# Patient Record
Sex: Male | Born: 1945 | Race: White | Hispanic: No | Smoking: Former smoker
Health system: Southern US, Community
[De-identification: ages and names within clinical notes are randomized; demographics above are authoritative.]

## PROBLEM LIST (undated history)

## (undated) DIAGNOSIS — I2510 Atherosclerotic heart disease of native coronary artery without angina pectoris: Secondary | ICD-10-CM

## (undated) DIAGNOSIS — M545 Low back pain, unspecified: Secondary | ICD-10-CM

## (undated) DIAGNOSIS — K219 Gastro-esophageal reflux disease without esophagitis: Secondary | ICD-10-CM

## (undated) DIAGNOSIS — I442 Atrioventricular block, complete: Secondary | ICD-10-CM

## (undated) DIAGNOSIS — G458 Other transient cerebral ischemic attacks and related syndromes: Secondary | ICD-10-CM

## (undated) DIAGNOSIS — M25561 Pain in right knee: Secondary | ICD-10-CM

## (undated) DIAGNOSIS — F32A Depression, unspecified: Secondary | ICD-10-CM

## (undated) DIAGNOSIS — I10 Essential (primary) hypertension: Secondary | ICD-10-CM

## (undated) DIAGNOSIS — M47812 Spondylosis without myelopathy or radiculopathy, cervical region: Secondary | ICD-10-CM

## (undated) DIAGNOSIS — F329 Major depressive disorder, single episode, unspecified: Secondary | ICD-10-CM

## (undated) DIAGNOSIS — Z720 Tobacco use: Secondary | ICD-10-CM

## (undated) DIAGNOSIS — F1020 Alcohol dependence, uncomplicated: Secondary | ICD-10-CM

## (undated) DIAGNOSIS — I5032 Chronic diastolic (congestive) heart failure: Secondary | ICD-10-CM

## (undated) DIAGNOSIS — I739 Peripheral vascular disease, unspecified: Secondary | ICD-10-CM

## (undated) DIAGNOSIS — J449 Chronic obstructive pulmonary disease, unspecified: Secondary | ICD-10-CM

## (undated) DIAGNOSIS — I4821 Permanent atrial fibrillation: Secondary | ICD-10-CM

## (undated) DIAGNOSIS — E785 Hyperlipidemia, unspecified: Secondary | ICD-10-CM

## (undated) HISTORY — DX: Chronic obstructive pulmonary disease, unspecified: J44.9

## (undated) HISTORY — DX: Tobacco use: Z72.0

## (undated) HISTORY — DX: Permanent atrial fibrillation: I48.21

## (undated) HISTORY — DX: Atherosclerotic heart disease of native coronary artery without angina pectoris: I25.10

## (undated) HISTORY — DX: Hyperlipidemia, unspecified: E78.5

## (undated) HISTORY — DX: Gastro-esophageal reflux disease without esophagitis: K21.9

## (undated) HISTORY — DX: Depression, unspecified: F32.A

## (undated) HISTORY — DX: Low back pain, unspecified: M54.50

## (undated) HISTORY — DX: Low back pain: M54.5

## (undated) HISTORY — DX: Atrioventricular block, complete: I44.2

## (undated) HISTORY — DX: Pain in right knee: M25.561

## (undated) HISTORY — DX: Other transient cerebral ischemic attacks and related syndromes: G45.8

## (undated) HISTORY — DX: Alcohol dependence, uncomplicated: F10.20

## (undated) HISTORY — DX: Chronic diastolic (congestive) heart failure: I50.32

## (undated) HISTORY — DX: Spondylosis without myelopathy or radiculopathy, cervical region: M47.812

## (undated) HISTORY — DX: Peripheral vascular disease, unspecified: I73.9

## (undated) HISTORY — DX: Essential (primary) hypertension: I10

## (undated) HISTORY — PX: OTHER SURGICAL HISTORY: SHX169

## (undated) HISTORY — DX: Major depressive disorder, single episode, unspecified: F32.9

---

## 1998-04-02 ENCOUNTER — Encounter: Admission: RE | Admit: 1998-04-02 | Discharge: 1998-04-02 | Payer: Self-pay | Admitting: Internal Medicine

## 1998-06-20 ENCOUNTER — Encounter: Admission: RE | Admit: 1998-06-20 | Discharge: 1998-06-20 | Payer: Self-pay | Admitting: Internal Medicine

## 1998-09-07 ENCOUNTER — Encounter: Admission: RE | Admit: 1998-09-07 | Discharge: 1998-09-07 | Payer: Self-pay | Admitting: Internal Medicine

## 1998-09-10 ENCOUNTER — Encounter: Admission: RE | Admit: 1998-09-10 | Discharge: 1998-12-09 | Payer: Self-pay | Admitting: Internal Medicine

## 1999-01-22 ENCOUNTER — Encounter: Admission: RE | Admit: 1999-01-22 | Discharge: 1999-01-22 | Payer: Self-pay | Admitting: Internal Medicine

## 1999-01-28 ENCOUNTER — Encounter: Admission: RE | Admit: 1999-01-28 | Discharge: 1999-01-28 | Payer: Self-pay | Admitting: Family Medicine

## 1999-02-05 ENCOUNTER — Ambulatory Visit (HOSPITAL_COMMUNITY): Admission: RE | Admit: 1999-02-05 | Discharge: 1999-02-05 | Payer: Self-pay | Admitting: Hematology and Oncology

## 1999-02-05 ENCOUNTER — Encounter: Admission: RE | Admit: 1999-02-05 | Discharge: 1999-02-05 | Payer: Self-pay | Admitting: Hematology and Oncology

## 1999-02-13 ENCOUNTER — Encounter: Admission: RE | Admit: 1999-02-13 | Discharge: 1999-02-13 | Payer: Self-pay | Admitting: Family Medicine

## 1999-02-27 ENCOUNTER — Encounter: Admission: RE | Admit: 1999-02-27 | Discharge: 1999-02-27 | Payer: Self-pay | Admitting: Family Medicine

## 1999-03-06 ENCOUNTER — Encounter: Admission: RE | Admit: 1999-03-06 | Discharge: 1999-03-06 | Payer: Self-pay | Admitting: Internal Medicine

## 1999-03-22 ENCOUNTER — Inpatient Hospital Stay (HOSPITAL_COMMUNITY): Admission: EM | Admit: 1999-03-22 | Discharge: 1999-03-24 | Payer: Self-pay | Admitting: Emergency Medicine

## 1999-03-22 ENCOUNTER — Encounter: Payer: Self-pay | Admitting: Cardiovascular Disease

## 1999-03-27 ENCOUNTER — Ambulatory Visit: Admission: RE | Admit: 1999-03-27 | Discharge: 1999-03-27 | Payer: Self-pay | Admitting: Cardiovascular Disease

## 1999-04-25 ENCOUNTER — Inpatient Hospital Stay: Admission: RE | Admit: 1999-04-25 | Discharge: 1999-04-27 | Payer: Self-pay | Admitting: Vascular Surgery

## 1999-04-27 HISTORY — PX: OTHER SURGICAL HISTORY: SHX169

## 1999-06-19 ENCOUNTER — Encounter: Admission: RE | Admit: 1999-06-19 | Discharge: 1999-06-19 | Payer: Self-pay | Admitting: Internal Medicine

## 1999-08-29 ENCOUNTER — Encounter: Admission: RE | Admit: 1999-08-29 | Discharge: 1999-08-29 | Payer: Self-pay | Admitting: Internal Medicine

## 1999-09-17 ENCOUNTER — Encounter: Admission: RE | Admit: 1999-09-17 | Discharge: 1999-09-17 | Payer: Self-pay | Admitting: Hematology and Oncology

## 1999-10-30 ENCOUNTER — Encounter: Admission: RE | Admit: 1999-10-30 | Discharge: 1999-10-30 | Payer: Self-pay | Admitting: Hematology and Oncology

## 1999-12-18 ENCOUNTER — Encounter: Admission: RE | Admit: 1999-12-18 | Discharge: 1999-12-18 | Payer: Self-pay | Admitting: Internal Medicine

## 2000-01-29 ENCOUNTER — Encounter: Admission: RE | Admit: 2000-01-29 | Discharge: 2000-01-29 | Payer: Self-pay | Admitting: Internal Medicine

## 2000-06-03 ENCOUNTER — Encounter: Admission: RE | Admit: 2000-06-03 | Discharge: 2000-06-03 | Payer: Self-pay | Admitting: Internal Medicine

## 2000-06-24 ENCOUNTER — Encounter: Payer: Self-pay | Admitting: Internal Medicine

## 2000-08-11 ENCOUNTER — Encounter: Admission: RE | Admit: 2000-08-11 | Discharge: 2000-08-11 | Payer: Self-pay | Admitting: Internal Medicine

## 2000-10-14 ENCOUNTER — Encounter: Admission: RE | Admit: 2000-10-14 | Discharge: 2000-10-14 | Payer: Self-pay | Admitting: Internal Medicine

## 2000-10-27 HISTORY — PX: OTHER SURGICAL HISTORY: SHX169

## 2001-02-03 ENCOUNTER — Encounter: Admission: RE | Admit: 2001-02-03 | Discharge: 2001-02-03 | Payer: Self-pay | Admitting: Internal Medicine

## 2001-02-25 ENCOUNTER — Encounter: Admission: RE | Admit: 2001-02-25 | Discharge: 2001-02-25 | Payer: Self-pay | Admitting: Internal Medicine

## 2001-02-25 ENCOUNTER — Ambulatory Visit (HOSPITAL_COMMUNITY): Admission: RE | Admit: 2001-02-25 | Discharge: 2001-02-25 | Payer: Self-pay | Admitting: Internal Medicine

## 2001-03-11 ENCOUNTER — Encounter: Admission: RE | Admit: 2001-03-11 | Discharge: 2001-03-11 | Payer: Self-pay | Admitting: Hematology and Oncology

## 2001-04-06 ENCOUNTER — Encounter: Admission: RE | Admit: 2001-04-06 | Discharge: 2001-04-06 | Payer: Self-pay | Admitting: Internal Medicine

## 2001-05-11 ENCOUNTER — Ambulatory Visit (HOSPITAL_COMMUNITY): Admission: RE | Admit: 2001-05-11 | Discharge: 2001-05-11 | Payer: Self-pay | Admitting: Neurosurgery

## 2001-05-11 ENCOUNTER — Encounter: Payer: Self-pay | Admitting: Neurosurgery

## 2001-06-09 ENCOUNTER — Encounter: Payer: Self-pay | Admitting: Neurosurgery

## 2001-06-09 ENCOUNTER — Ambulatory Visit (HOSPITAL_COMMUNITY): Admission: RE | Admit: 2001-06-09 | Discharge: 2001-06-10 | Payer: Self-pay | Admitting: Neurosurgery

## 2001-07-16 ENCOUNTER — Encounter: Admission: RE | Admit: 2001-07-16 | Discharge: 2001-07-16 | Payer: Self-pay | Admitting: Internal Medicine

## 2001-07-29 ENCOUNTER — Encounter: Payer: Self-pay | Admitting: Neurosurgery

## 2001-07-29 ENCOUNTER — Ambulatory Visit (HOSPITAL_COMMUNITY): Admission: RE | Admit: 2001-07-29 | Discharge: 2001-07-29 | Payer: Self-pay | Admitting: Neurosurgery

## 2001-09-27 ENCOUNTER — Ambulatory Visit (HOSPITAL_COMMUNITY): Admission: RE | Admit: 2001-09-27 | Discharge: 2001-09-27 | Payer: Self-pay | Admitting: Neurosurgery

## 2001-09-27 ENCOUNTER — Encounter: Payer: Self-pay | Admitting: Neurosurgery

## 2001-10-01 ENCOUNTER — Ambulatory Visit (HOSPITAL_COMMUNITY): Admission: RE | Admit: 2001-10-01 | Discharge: 2001-10-01 | Payer: Self-pay | Admitting: Neurosurgery

## 2001-10-01 ENCOUNTER — Encounter: Payer: Self-pay | Admitting: Neurosurgery

## 2001-12-22 ENCOUNTER — Encounter: Admission: RE | Admit: 2001-12-22 | Discharge: 2001-12-22 | Payer: Self-pay | Admitting: Internal Medicine

## 2002-03-30 ENCOUNTER — Encounter: Admission: RE | Admit: 2002-03-30 | Discharge: 2002-03-30 | Payer: Self-pay | Admitting: Internal Medicine

## 2002-05-10 ENCOUNTER — Encounter: Admission: RE | Admit: 2002-05-10 | Discharge: 2002-05-10 | Payer: Self-pay | Admitting: Internal Medicine

## 2002-05-31 ENCOUNTER — Encounter: Admission: RE | Admit: 2002-05-31 | Discharge: 2002-05-31 | Payer: Self-pay | Admitting: Internal Medicine

## 2002-06-21 ENCOUNTER — Encounter: Admission: RE | Admit: 2002-06-21 | Discharge: 2002-06-21 | Payer: Self-pay | Admitting: Internal Medicine

## 2002-08-16 ENCOUNTER — Encounter: Admission: RE | Admit: 2002-08-16 | Discharge: 2002-08-16 | Payer: Self-pay | Admitting: Internal Medicine

## 2002-11-16 ENCOUNTER — Encounter: Admission: RE | Admit: 2002-11-16 | Discharge: 2002-11-16 | Payer: Self-pay | Admitting: Internal Medicine

## 2003-03-08 ENCOUNTER — Encounter: Admission: RE | Admit: 2003-03-08 | Discharge: 2003-03-08 | Payer: Self-pay | Admitting: Internal Medicine

## 2003-03-14 ENCOUNTER — Encounter: Admission: RE | Admit: 2003-03-14 | Discharge: 2003-03-14 | Payer: Self-pay | Admitting: Internal Medicine

## 2003-10-31 ENCOUNTER — Encounter: Admission: RE | Admit: 2003-10-31 | Discharge: 2003-10-31 | Payer: Self-pay | Admitting: Internal Medicine

## 2004-01-09 ENCOUNTER — Encounter: Admission: RE | Admit: 2004-01-09 | Discharge: 2004-01-09 | Payer: Self-pay | Admitting: Internal Medicine

## 2004-02-26 LAB — HM COLONOSCOPY: HM Colonoscopy: 3

## 2004-04-24 ENCOUNTER — Encounter: Admission: RE | Admit: 2004-04-24 | Discharge: 2004-04-24 | Payer: Self-pay | Admitting: Internal Medicine

## 2004-07-18 ENCOUNTER — Ambulatory Visit: Payer: Self-pay | Admitting: Internal Medicine

## 2004-09-26 ENCOUNTER — Ambulatory Visit: Payer: Self-pay | Admitting: Internal Medicine

## 2004-12-30 ENCOUNTER — Ambulatory Visit: Payer: Self-pay | Admitting: Internal Medicine

## 2005-04-01 ENCOUNTER — Ambulatory Visit: Payer: Self-pay | Admitting: Internal Medicine

## 2005-06-09 ENCOUNTER — Ambulatory Visit: Payer: Self-pay | Admitting: Internal Medicine

## 2006-01-05 ENCOUNTER — Ambulatory Visit: Payer: Self-pay | Admitting: Internal Medicine

## 2006-07-13 ENCOUNTER — Ambulatory Visit: Payer: Self-pay | Admitting: Internal Medicine

## 2006-07-28 ENCOUNTER — Ambulatory Visit: Payer: Self-pay | Admitting: Internal Medicine

## 2006-09-09 DIAGNOSIS — K219 Gastro-esophageal reflux disease without esophagitis: Secondary | ICD-10-CM

## 2006-09-09 DIAGNOSIS — F1011 Alcohol abuse, in remission: Secondary | ICD-10-CM | POA: Insufficient documentation

## 2006-09-09 DIAGNOSIS — G458 Other transient cerebral ischemic attacks and related syndromes: Secondary | ICD-10-CM

## 2006-09-09 DIAGNOSIS — F329 Major depressive disorder, single episode, unspecified: Secondary | ICD-10-CM

## 2006-09-09 DIAGNOSIS — I10 Essential (primary) hypertension: Secondary | ICD-10-CM

## 2006-09-09 DIAGNOSIS — E785 Hyperlipidemia, unspecified: Secondary | ICD-10-CM | POA: Insufficient documentation

## 2006-09-09 DIAGNOSIS — Z9889 Other specified postprocedural states: Secondary | ICD-10-CM

## 2006-09-09 DIAGNOSIS — J449 Chronic obstructive pulmonary disease, unspecified: Secondary | ICD-10-CM

## 2006-10-15 DIAGNOSIS — M545 Low back pain: Secondary | ICD-10-CM

## 2006-11-16 ENCOUNTER — Ambulatory Visit: Payer: Self-pay | Admitting: Internal Medicine

## 2006-11-16 LAB — CONVERTED CEMR LAB
BUN: 18 mg/dL (ref 6–23)
CO2: 25 meq/L (ref 19–32)
Calcium: 9.6 mg/dL (ref 8.4–10.5)
Chloride: 104 meq/L (ref 96–112)
Cholesterol: 147 mg/dL (ref 0–200)
Creatinine, Ser: 0.89 mg/dL (ref 0.40–1.50)
Glucose, Bld: 95 mg/dL (ref 70–99)
HDL: 33 mg/dL — ABNORMAL LOW (ref >39)
LDL Cholesterol: 85 mg/dL (ref 0–99)
Magnesium: 2.1 mg/dL (ref 1.5–2.5)
Potassium: 4.9 meq/L (ref 3.5–5.3)
Sodium: 140 meq/L (ref 135–145)
Total CHOL/HDL Ratio: 4.5
Triglycerides: 143 mg/dL (ref <150)
VLDL: 29 mg/dL (ref 0–40)

## 2007-02-11 ENCOUNTER — Telehealth: Payer: Self-pay | Admitting: *Deleted

## 2007-02-26 ENCOUNTER — Telehealth (INDEPENDENT_AMBULATORY_CARE_PROVIDER_SITE_OTHER): Payer: Self-pay | Admitting: *Deleted

## 2007-04-12 ENCOUNTER — Telehealth (INDEPENDENT_AMBULATORY_CARE_PROVIDER_SITE_OTHER): Payer: Self-pay | Admitting: *Deleted

## 2007-05-03 ENCOUNTER — Ambulatory Visit: Payer: Self-pay | Admitting: Internal Medicine

## 2007-06-08 ENCOUNTER — Telehealth: Payer: Self-pay | Admitting: Infectious Disease

## 2007-07-08 ENCOUNTER — Telehealth: Payer: Self-pay | Admitting: *Deleted

## 2007-08-09 ENCOUNTER — Telehealth (INDEPENDENT_AMBULATORY_CARE_PROVIDER_SITE_OTHER): Payer: Self-pay | Admitting: *Deleted

## 2007-09-01 ENCOUNTER — Telehealth (INDEPENDENT_AMBULATORY_CARE_PROVIDER_SITE_OTHER): Payer: Self-pay | Admitting: *Deleted

## 2007-10-06 ENCOUNTER — Telehealth: Payer: Self-pay | Admitting: *Deleted

## 2007-10-07 ENCOUNTER — Encounter: Payer: Self-pay | Admitting: Internal Medicine

## 2007-11-05 ENCOUNTER — Ambulatory Visit: Payer: Self-pay | Admitting: Internal Medicine

## 2007-11-05 ENCOUNTER — Telehealth: Payer: Self-pay | Admitting: *Deleted

## 2007-11-05 LAB — CONVERTED CEMR LAB
BUN: 17 mg/dL (ref 6–23)
CO2: 25 meq/L (ref 19–32)
Calcium: 9.3 mg/dL (ref 8.4–10.5)
Chloride: 105 meq/L (ref 96–112)
Creatinine, Ser: 0.82 mg/dL (ref 0.40–1.50)
Glucose, Bld: 127 mg/dL — ABNORMAL HIGH (ref 70–99)
Potassium: 5 meq/L (ref 3.5–5.3)
Sodium: 142 meq/L (ref 135–145)

## 2008-01-03 ENCOUNTER — Telehealth: Payer: Self-pay | Admitting: *Deleted

## 2008-03-28 ENCOUNTER — Telehealth: Payer: Self-pay | Admitting: Internal Medicine

## 2008-04-03 ENCOUNTER — Telehealth: Payer: Self-pay | Admitting: *Deleted

## 2008-05-17 ENCOUNTER — Ambulatory Visit: Payer: Self-pay | Admitting: Internal Medicine

## 2008-05-17 ENCOUNTER — Ambulatory Visit (HOSPITAL_COMMUNITY): Admission: RE | Admit: 2008-05-17 | Discharge: 2008-05-17 | Payer: Self-pay | Admitting: Internal Medicine

## 2008-05-19 ENCOUNTER — Encounter: Payer: Self-pay | Admitting: Internal Medicine

## 2008-05-24 ENCOUNTER — Encounter: Payer: Self-pay | Admitting: Internal Medicine

## 2008-07-19 ENCOUNTER — Encounter: Payer: Self-pay | Admitting: Internal Medicine

## 2008-08-09 ENCOUNTER — Encounter: Payer: Self-pay | Admitting: Internal Medicine

## 2008-08-28 ENCOUNTER — Telehealth: Payer: Self-pay | Admitting: Internal Medicine

## 2008-10-12 ENCOUNTER — Encounter: Payer: Self-pay | Admitting: Internal Medicine

## 2008-11-20 ENCOUNTER — Telehealth: Payer: Self-pay | Admitting: Internal Medicine

## 2008-11-24 ENCOUNTER — Ambulatory Visit: Payer: Self-pay | Admitting: Internal Medicine

## 2008-11-24 LAB — CONVERTED CEMR LAB
CO2: 25 meq/L (ref 19–32)
Creatinine, Ser: 0.88 mg/dL (ref 0.40–1.50)
Glucose, Bld: 108 mg/dL — ABNORMAL HIGH (ref 70–99)
Total Bilirubin: 0.3 mg/dL (ref 0.3–1.2)

## 2008-12-21 ENCOUNTER — Telehealth: Payer: Self-pay | Admitting: Internal Medicine

## 2009-01-23 ENCOUNTER — Telehealth: Payer: Self-pay | Admitting: Internal Medicine

## 2009-01-31 ENCOUNTER — Telehealth: Payer: Self-pay | Admitting: Internal Medicine

## 2009-03-23 ENCOUNTER — Ambulatory Visit: Payer: Self-pay | Admitting: Internal Medicine

## 2009-03-23 LAB — CONVERTED CEMR LAB
CO2: 26 meq/L (ref 19–32)
GFR calc Af Amer: >60 mL/min (ref >60)
Glucose, Bld: 109 mg/dL — ABNORMAL HIGH (ref 70–99)
Hemoglobin: 16 g/dL (ref 13.0–17.0)
MCHC: 35 g/dL (ref 30.0–36.0)
MCV: 96.6 fL (ref 78.0–100.0)
Potassium: 4.3 meq/L (ref 3.5–5.3)
RBC: 4.73 M/uL (ref 4.22–5.81)
Sodium: 140 meq/L (ref 135–145)
WBC: 7.9 10*3/uL (ref 4.0–10.5)

## 2009-05-27 DIAGNOSIS — M25561 Pain in right knee: Secondary | ICD-10-CM

## 2009-05-27 HISTORY — DX: Pain in right knee: M25.561

## 2009-06-14 ENCOUNTER — Ambulatory Visit: Payer: Self-pay | Admitting: Internal Medicine

## 2009-06-14 DIAGNOSIS — M25569 Pain in unspecified knee: Secondary | ICD-10-CM

## 2009-09-17 ENCOUNTER — Telehealth: Payer: Self-pay | Admitting: Internal Medicine

## 2009-11-05 ENCOUNTER — Ambulatory Visit: Payer: Self-pay | Admitting: Internal Medicine

## 2009-12-26 ENCOUNTER — Telehealth: Payer: Self-pay | Admitting: *Deleted

## 2010-01-04 ENCOUNTER — Ambulatory Visit: Payer: Self-pay | Admitting: Internal Medicine

## 2010-01-04 LAB — CONVERTED CEMR LAB
CO2: 25 meq/L (ref 19–32)
Chloride: 102 meq/L (ref 96–112)
Creatinine, Ser: 0.86 mg/dL (ref 0.40–1.50)
HDL: 34 mg/dL — ABNORMAL LOW (ref >39)
LDL Cholesterol: 83 mg/dL (ref 0–99)
Potassium: 5 meq/L (ref 3.5–5.3)
VLDL: 24 mg/dL (ref 0–40)

## 2010-02-04 ENCOUNTER — Telehealth: Payer: Self-pay | Admitting: Internal Medicine

## 2010-06-26 ENCOUNTER — Telehealth: Payer: Self-pay | Admitting: Internal Medicine

## 2010-07-19 ENCOUNTER — Ambulatory Visit: Payer: Self-pay | Admitting: Internal Medicine

## 2010-07-22 LAB — CONVERTED CEMR LAB
ALT: 11 units/L (ref 0–53)
AST: 19 units/L (ref 0–37)
Alkaline Phosphatase: 73 units/L (ref 39–117)
Amphetamine Screen, Ur: NEGATIVE
Barbiturate Quant, Ur: NEGATIVE
Benzodiazepines.: NEGATIVE
CO2: 27 meq/L (ref 19–32)
Creatinine, Ser: 0.87 mg/dL (ref 0.40–1.50)
Marijuana Metabolite: NEGATIVE
Methadone: NEGATIVE
Opiates: POSITIVE — AB
Propoxyphene: NEGATIVE
Sodium: 139 meq/L (ref 135–145)
Total Bilirubin: 0.4 mg/dL (ref 0.3–1.2)
Total Protein: 7.3 g/dL (ref 6.0–8.3)

## 2010-11-24 LAB — CONVERTED CEMR LAB
ALT: 10 units/L (ref 0–53)
AST: 16 units/L (ref 0–37)
Albumin: 4.4 g/dL (ref 3.5–5.2)
Alkaline Phosphatase: 64 units/L (ref 39–117)
BUN: 12 mg/dL (ref 6–23)
CO2: 26 meq/L (ref 19–32)
Calcium: 9.2 mg/dL (ref 8.4–10.5)
Chloride: 105 meq/L (ref 96–112)
Cholesterol: 117 mg/dL (ref 0–200)
Creatinine, Ser: 0.8 mg/dL (ref 0.40–1.50)
Glucose, Bld: 102 mg/dL — ABNORMAL HIGH (ref 70–99)
HDL: 29 mg/dL — ABNORMAL LOW (ref >39)
LDL Cholesterol: 56 mg/dL (ref 0–99)
Potassium: 4.8 meq/L (ref 3.5–5.3)
Sodium: 142 meq/L (ref 135–145)
Total Bilirubin: 0.4 mg/dL (ref 0.3–1.2)
Total CHOL/HDL Ratio: 4
Total Protein: 6.6 g/dL (ref 6.0–8.3)
Triglycerides: 160 mg/dL — ABNORMAL HIGH (ref <150)
VLDL: 32 mg/dL (ref 0–40)

## 2010-11-28 NOTE — Progress Notes (Signed)
Summary: Refill/gh  Phone Note Refill Request Message from:  Fax from Pharmacy on December 26, 2009 4:42 PM  Refills Requested: Medication #1:  HYDROCODONE-ACETAMINOPHEN 5-325 MG TABS Take 2  tablets by mouth three times a day as needed for pain   Last Refilled: 11/27/2009  Method Requested: Electronic Initial call taken by: Angelina Ok RN,  December 26, 2009 4:42 PM  Follow-up for Phone Call        Refill approved-nurse to complete Follow-up by: Ulyess Mort MD,  December 27, 2009 11:17 AM  Additional Follow-up for Phone Call Additional follow up Details #1::        called to pharm, 5 total fills, cannot call more than 5 at a time Additional Follow-up by: Marin Roberts RN,  December 27, 2009 12:02 PM    Prescriptions: HYDROCODONE-ACETAMINOPHEN 5-325 MG TABS (HYDROCODONE-ACETAMINOPHEN) Take 2  tablets by mouth three times a day as needed for pain  #180 x 6   Entered and Authorized by:   Ulyess Mort MD   Signed by:   Ulyess Mort MD on 12/27/2009   Method used:   Telephoned to ...       [facility] (retail)       [address]       [place], [place]  [id number]       Ph: [phone]       Fax: [phone]   RxID:   [id number]

## 2010-11-28 NOTE — Assessment & Plan Note (Signed)
Summary: FU VISIT/DS   Vital Signs:  Patient profile:   65 year old male Height:      67 inches (170.18 cm) Weight:      143.6 pounds (65.27 kg) BMI:     22.57 Temp:     97.9 degrees F (36.61 degrees C) oral Pulse rate:   58 / minute BP sitting:   132 / 74  (left arm) Cuff size:   regular  Vitals Entered By: Cynda Familia Duncan Dull) (July 19, 2010 9:40 AM) CC: routine f/u and flu vaccine Is Patient Diabetic? No Nutritional Status BMI of 19 -24 = normal  Does patient need assistance? Functional Status Self care Ambulation Normal   CC:  routine f/u and flu vaccine.  History of Present Illness: 89 man with HTN abd depression. Doing well. Busy and not depressed. Brought in all meds and taking all w/o problems.  Preventive Screening-Counseling & Management  Alcohol-Tobacco     Smoking Status: current     Smoking Cessation Counseling: yes     Packs/Day: 4cigs/day  Allergies: 1)  ! Asa  Physical Exam  General:  well-developed.   Eyes:  anicteric Neck:  no carotid bruits.   Lungs:  remarkably clear with normal amplitude breath sounds Heart:  regular.  no murmur Extremities:  no edema   Impression & Recommendations:  Problem # 1:  HYPERTENSION (ICD-401.9)  good control.  Check Bmet.   His updated medication list for this problem includes:    Atenolol 25 Mg Tabs (Atenolol) .[name]... Take 1 tablet by mouth once a day    Lisinopril-hydrochlorothiazide 20-12.5 Mg Tabs (Lisinopril-hydrochlorothiazide) .[name]... Take 1 tablet by mouth once a day  Problem # 2:  HYPERLIPIDEMIA (ICD-272.4)  # panels in past 3 years -- all the same.  Low HDL and low LDL   His updated medication list for this problem includes:    Lipitor 40 Mg Tabs (Atorvastatin calcium) .[name]... Take 1 tablet by mouth once a day  Orders: T-CMP with Estimated GFR ([id number])  Problem # 3:  Preventive Health Care (ICD-V70.0) Has regular eye visits in [place]. Has regular f/u with Dr. [name] (GI) in  [place].  Has had colonoscopy in past 2 years.  Problem # 4:  LOW BACK PAIN SYNDROME (ICD-724.2) He has diffuse arthritic complaints most c/w DJD.  Has been on stable dose of vicodin for years w/o any problems.  Will continue this and obtain UDS today.   His updated medication list for this problem includes:    Hydrocodone-acetaminophen 5-325 Mg Tabs (Hydrocodone-acetaminophen) .[name]... Take 2  tablets by mouth three times a day as needed for pain    Aspirin 81 Mg Tbec (Aspirin) .[name]... Take 1 tablet by mouth once a day  Orders: T-Drug Screen-Urine, (single) [phone])  Complete Medication List: 1)  Atenolol 25 Mg Tabs (Atenolol) .... Take 1 tablet by mouth once a day 2)  Lipitor 40 Mg Tabs (Atorvastatin calcium) .... Take 1 tablet by mouth once a day 3)  Hydrocodone-acetaminophen 5-325 Mg Tabs (Hydrocodone-acetaminophen) .... Take 2  tablets by mouth three times a day as needed for pain 4)  Zoloft 50 Mg Tabs (Sertraline hcl) .... Take 1 tablet by mouth once a day 5)  Aspirin 81 Mg Tbec (Aspirin) .... Take 1 tablet by mouth once a day 6)  Albuterol 90 Mcg/act Aers (Albuterol) .... Inhale 2 puffs as needed 7)  Lisinopril-hydrochlorothiazide 20-12.5 Mg Tabs (Lisinopril-hydrochlorothiazide) .... Take 1 tablet by mouth once a day  Patient Instructions: 1)  Please  schedule a follow-up appointment in 6 months. Prescriptions: ZOLOFT 50 MG TABS (SERTRALINE HCL) Take 1 tablet by mouth once a day  #31 Tablet x 5   Entered and Authorized by:   Ulyess Mort MD   Signed by:   Ulyess Mort MD on 07/19/2010   Method used:   Telephoned to ...       [facility] (retail)       [address]       [place], [place]  [address]       Ph: [phone]       Fax: [phone]   RxID:   [id number] HYDROCODONE-ACETAMINOPHEN 5-325 MG TABS (HYDROCODONE-ACETAMINOPHEN) Take 2  tablets by mouth three times a day as needed for pain  #180 x 3   Entered and  Authorized by:   Ulyess Mort MD   Signed by:   Ulyess Mort MD on 07/19/2010   Method used:   Telephoned to ...       [facility] (retail)       [address]       [place], [place]  [address]       Ph: [phone]       Fax: [phone]   RxID:   [id number] LIPITOR 40 MG TABS (ATORVASTATIN CALCIUM) Take 1 tablet by mouth once a day  #31 x 11   Entered and Authorized by:   Ulyess Mort MD   Signed by:   Ulyess Mort MD on 07/19/2010   Method used:   Telephoned to ...       [facility] (retail)       [address]       [place], [place]  [id number]       Ph: [phone]       Fax: [phone]   RxID:   [id number] ATENOLOL 25 MG TABS (ATENOLOL) Take 1 tablet by mouth once a day  #31 Tablet x 11   Entered and Authorized by:   Ulyess Mort MD   Signed by:   Ulyess Mort MD on 07/19/2010   Method used:   Telephoned to ...       [facility] (retail)       [address]       [place], [place]  [address]       Ph: [phone]       Fax: [phone]   RxID:   [id number]   Prevention & Chronic Care Immunizations   Influenza vaccine: Fluvax 3+  (11/05/2009)    Tetanus booster: Not documented    Pneumococcal vaccine: Pneumovax  (01/04/2010)    H. zoster vaccine: Not documented  Colorectal Screening   Hemoccult: Not documented   Hemoccult action/deferral: Not indicated  (01/04/2010)    Colonoscopy: 3 small polyps.  He reports that he was told on follow-up visit that polyps were benign, and he would need repeat in 5 years.  (02/26/2004)   Colonoscopy due: 02/2009  Other Screening   PSA: Not documented   PSA action/deferral: Not indicated  (01/04/2010)   Smoking status: current  (07/19/2010)   Smoking cessation counseling: yes  (07/19/2010)    Screening comments: Has regular GI visits with Dr. [name] in [place]  Lipids    Total Cholesterol: 141  (01/04/2010)  Lipid panel action/deferral: Lipid Panel ordered   LDL: 83  (01/04/2010)   LDL Direct: Not documented   HDL: 34  (01/04/2010)   Triglycerides: 119  (01/04/2010)    SGOT (AST): 26  (11/24/2008)   SGPT (ALT): 30  (11/24/2008)   Alkaline phosphatase: 75  (11/24/2008)   Total bilirubin: 0.3  (11/24/2008)    Lipid flowsheet reviewed?: Yes   Progress toward LDL goal: At goal  Hypertension   Last Blood Pressure: 132 / 74  (07/19/2010)   Serum creatinine: 0.86  (01/04/2010)   Serum potassium 5.0  (01/04/2010)    Hypertension flowsheet reviewed?: Yes   Progress toward BP goal: At goal  Self-Management Support :   Personal Goals (by the next clinic visit) :      Personal blood pressure goal: 140/90  (01/04/2010)     Personal LDL goal: 100  (01/04/2010)    Patient will work on the following items until the next clinic visit to reach self-care goals:     Medications and monitoring: take my medicines every day  (07/19/2010)     Eating: eat foods that are low in salt, eat baked foods instead of fried foods  (07/19/2010)     Activity: take a 30 minute walk every day  (01/04/2010)    Hypertension self-management support: Written self-care plan  (07/19/2010)   Hypertension self-care plan printed.    Lipid self-management support: Written self-care plan  (07/19/2010)   Lipid self-care plan printed.   Nursing Instructions: Give Flu vaccine today   Process Orders Check Orders Results:     [facility]: ABN not required for this insurance Order queued for requisitioning for [facility]: July 19, 2010 10:07 AM  Tests Sent for requisitioning (July 19, 2010 10:07 AM):     07/19/2010: [facility] -- T-Drug Screen-Urine, (single) [[id number]] (signed)     07/19/2010: [facility] -- T-CMP with Estimated GFR [[id number]] (signed)      Process Orders Check Orders Results:     [facility]: ABN not required for this insurance Order queued for requisitioning for [facility]: July 19, 2010 10:07 AM  Tests Sent for requisitioning (July 19, 2010 10:07 AM):     07/19/2010: [facility] -- T-Drug Screen-Urine, (single) [[id number]] (signed)     07/19/2010: [facility] -- T-CMP with Estimated GFR [[id number]] (signed)    Appended Document: flu vaccine//kg rx called into pharmacy.Cynda Familia [facility])  July 19, 2010 1:29 PM    Clinical Lists Changes  Orders: Added new Service order of Admin 1st Vaccine ([id number]) - Signed Added new Service order of Flu Vaccine 67yrs + [phone]) - Signed Observations: Added new observation of FLU VAX VIS: 05/21/2010 version (07/19/2010 11:56) Added new observation of FLU VAXLOT: [id number] (07/19/2010 11:56) Added new observation of FLU VAXMFR: [employer] (07/19/2010 11:56) Added new observation of FLU VAX EXP: 04/26/2011 (07/19/2010 11:56) Added new observation of FLU VAX DSE: 0.40ml (07/19/2010 11:56) Added new observation of FLU VAX: Fluvax 3+ (07/19/2010 11:56)Flu Vaccine Consent Questions     Do you have a history of severe allergic reactions to this vaccine? no    Any prior history of allergic reactions to egg and/or gelatin? no    Do you have a sensitivity to the preservative Thimersol? no    Do you have a past history of Guillan-Barre Syndrome? no    Do you currently have an acute febrile illness? no    Have you ever had a severe reaction to latex? no  Vaccine information given and explained to patient? yes    Are you currently pregnant? no    Lot Number:[id number]   Exp Date:04/26/2011   Manufacturer: [employer]    Site Given  Left Deltoid IM.Cynda Familia [facility])  July 19, 2010 11:56 AM  observation of FLU VAX DSE: 0.67ml (07/19/2010 11:56) Added new observation of FLU VAX: Fluvax 3+ (07/19/2010 11:56)     .opcflu

## 2010-11-28 NOTE — Progress Notes (Signed)
Summary: Prior Authorization- Lipitor  Phone Note Outgoing Call   Call placed by: Angelina Ok RN,  February 04, 2010 10:39 AM Call placed to: Insurer Summary of Call: Prior Authorizaton approved for Lipitor 40 mg tablets 02-04-2010 thru 02-04-2011.Angelina Ok RN  February 04, 2010 10:40 AM\par Initial call taken by: Angelina Ok RN,  February 04, 2010 10:40 AM    New/Updated Medications: LIPITOR 40 MG TABS (ATORVASTATIN CALCIUM) Take 1 tablet by mouth once a day

## 2010-11-28 NOTE — Assessment & Plan Note (Signed)
Summary: EST-CK/FU/MEDS/CFB   Vital Signs:  Patient profile:   65 year old male Height:      67 inches (170.18 cm) Weight:      150.9 pounds (68.59 kg) BMI:     23.72 Temp:     97.0 degrees F (36.11 degrees C) oral Pulse rate:   51 / minute BP sitting:   137 / 77  (right arm)  Vitals Entered By: Krystal Eaton Duncan Dull) (January 04, 2010 9:54 AM) CC: here for routine f/u, c/o "bronchitis" excessive coughing make his chest  hurt Is Patient Diabetic? No Pain Assessment Patient in pain? yes     Location: chest Intensity: 7 Type: "sore" Onset of pain  Intermittent x 2wks especially wehn coughing Nutritional Status BMI of 19 -24 = normal  Have you ever been in a relationship where you felt threatened, hurt or afraid?No   Does patient need assistance? Functional Status Self care Ambulation Normal   CC:  here for routine f/u and c/o "bronchitis" excessive coughing make his chest  hurt.  History of Present Illness: 18 man with COPD, still smoking.  Today complains of "bronchitis".  Cough increased 2 weeks.  Some green phlegm.  No fever.  Less active in cold weather and with cough.  Preventive Screening-Counseling & Management  Alcohol-Tobacco     Smoking Status: current     Smoking Cessation Counseling: yes     Packs/Day: 4cigs/day  Allergies: 1)  ! Asa  Physical Exam  Eyes:  anicteric.  Nl injection. Neck:  no thyromegaly and no JVD.   Lungs:  Clear. Heart:  regular w/o murmur. Abdomen:  soft, non-tender, no hepatomegaly, and no splenomegaly.   Extremities:  no edema.   Impression & Recommendations:  Problem # 1:  COPD (ICD-496) acute flare, mild.  No hint of pneumonia.   His updated medication list for this problem includes:    Albuterol 90 Mcg/act Aers (Albuterol) ..... Inhale 2 puffs as needed  Problem # 2:  HYPERTENSION (ICD-401.9)  good control despite less than perfect adherence.   The following medications were removed from the medication list:  Lisinopril 10 Mg Tabs (Lisinopril) .[name]... Take 1 tablet by mouth once a day    Hydrochlorothiazide 25 Mg Tabs (Hydrochlorothiazide) .[name]... Take 1 tablet by mouth once a day His updated medication list for this problem includes:    Atenolol 25 Mg Tabs (Atenolol) .[name]... Take 1 tablet by mouth once a day    Lisinopril-hydrochlorothiazide 20-12.5 Mg Tabs (Lisinopril-hydrochlorothiazide) .[name]... Take 1 tablet by mouth once a day  Orders: T-Lipid Profile ([id number]) T-Basic Metabolic Panel ([id number])  Complete Medication List: 1)  Atenolol 25 Mg Tabs (Atenolol) .... Take 1 tablet by mouth once a day 2)  Lipitor 40 Mg Tabs (Atorvastatin calcium) .... Take 1 tablet by mouth once a day 3)  Hydrocodone-acetaminophen 5-325 Mg Tabs (Hydrocodone-acetaminophen) .... Take 2  tablets by mouth three times a day as needed for pain 4)  Zoloft 50 Mg Tabs (Sertraline hcl) .... Take 1 tablet by mouth once a day 5)  Aspirin 81 Mg Tbec (Aspirin) .... Take 1 tablet by mouth once a day 6)  Albuterol 90 Mcg/act Aers (Albuterol) .... Inhale 2 puffs as needed 7)  Lisinopril-hydrochlorothiazide 20-12.5 Mg Tabs (Lisinopril-hydrochlorothiazide) .... Take 1 tablet by mouth once a day  Patient Instructions: 1)  Please schedule a follow-up appointment in 6 months. 2)  Try to stop smoking. Prescriptions: LISINOPRIL-HYDROCHLOROTHIAZIDE 20-12.5 MG TABS (LISINOPRIL-HYDROCHLOROTHIAZIDE) Take 1 tablet by mouth once a day  #  30 x 11   Entered and Authorized by:   Ulyess Mort MD   Signed by:   Ulyess Mort MD on 01/04/2010   Method used:   Electronically to        [facility] (retail)       [address]       [place], [place]  [address]       Ph: [phone]       Fax: [phone]   RxID:   [id number]   Prevention & Chronic Care Immunizations   Influenza vaccine: Fluvax 3+  (11/05/2009)    Tetanus booster: Not documented    Pneumococcal vaccine: Not documented     H. zoster vaccine: Not documented  Colorectal Screening   Hemoccult: Not documented   Hemoccult action/deferral: Not indicated  (01/04/2010)    Colonoscopy: 3 small polyps.  He reports that he was told on follow-up visit that polyps were benign, and he would need repeat in 5 years.  (02/26/2004)   Colonoscopy due: 02/2009  Other Screening   PSA: Not documented   PSA action/deferral: Not indicated  (01/04/2010)   Smoking status: current  (01/04/2010)   Smoking cessation counseling: yes  (01/04/2010)    Screening comments: says had repeat colonoscopy last year.  Dr. [name] in [place].  Lipids   Total Cholesterol: 117  (05/17/2008)   Lipid panel action/deferral: Lipid Panel ordered   LDL: 56  (05/17/2008)   LDL Direct: Not documented   HDL: 29  (05/17/2008)   Triglycerides: 160  (05/17/2008)    SGOT (AST): 26  (11/24/2008)   SGPT (ALT): 30  (11/24/2008)   Alkaline phosphatase: 75  (11/24/2008)   Total bilirubin: 0.3  (11/24/2008)    Lipid flowsheet reviewed?: Yes   Progress toward LDL goal: At goal  Hypertension   Last Blood Pressure: 137 / 77  (01/04/2010)   Serum creatinine: 0.80  (03/23/2009)   Serum potassium 4.3  (03/23/2009)    Hypertension flowsheet reviewed?: Yes   Progress toward BP goal: At goal  Self-Management Support :   Personal Goals (by the next clinic visit) :      Personal blood pressure goal: 140/90  (01/04/2010)     Personal LDL goal: 100  (01/04/2010)    Patient will work on the following items until the next clinic visit to reach self-care goals:     Medications and monitoring: take my medicines every day  (01/04/2010)     Eating: eat more vegetables, eat foods that are low in salt, eat baked foods instead of fried foods  (01/04/2010)     Activity: take a 30 minute walk every day  (01/04/2010)    Hypertension self-management support: Pre-printed educational material, Resources for patients handout  (01/04/2010)    Lipid self-management  support: Pre-printed educational material, Resources for patients handout  (01/04/2010)        Resource handout printed.  Process Orders Check Orders Results:     [facility]: ABN not required for this insurance Tests Sent for requisitioning (January 04, 2010 11:57 AM):     01/04/2010: [facility] -- T-Lipid Profile [phone] (signed)     01/04/2010: [facility] -- T-Basic Metabolic Panel [phone] (signed)    Appended Document: immunization//kg    Nurse Visit   Allergies: 1)  ! Asa  Immunizations Administered:  Pneumonia Vaccine:    Vaccine Type: Pneumovax    Site: right deltoid  Mfr: Merck    Dose: 0.5 ml    Route: IM    Given by: Krystal Eaton (AAMA)    Exp. Date: 06/10/2011    Lot #: [id number]    VIS given: 05/24/96 version given January 04, 2010.  Orders Added: 1)  Pneumococcal Vaccine [90732] 2)  Admin 1st Vaccine [[address]]

## 2010-11-28 NOTE — Assessment & Plan Note (Signed)
Summary: FLU SHOT [MKJ]  [Vital Signs-4-CCC]  Allergies: 1)  ! Asa  Orders Added: 1)  Admin 1st Vaccine [90471] 2)  Flu Vaccine 58yrs + [25427]       Flu Vaccine Consent Questions     Do you have a history of severe allergic reactions to this vaccine? no    Any prior history of allergic reactions to egg and/or gelatin? no    Do you have a sensitivity to the preservative Thimersol? no    Do you have a past history of Guillan-Barre Syndrome? no    Do you currently have an acute febrile illness? no    Have you ever had a severe reaction to latex? no    Vaccine information given and explained to patient? yes    Are you currently pregnant? no    Lot [id number]   Exp Date:01/24/2010   Manufacturer: [employer]    Site Given  Right Deltoid IM Chinita Pester RN  November 05, 2009 10:19 AM

## 2010-11-28 NOTE — Progress Notes (Signed)
Summary: refill/gg  Phone Note Refill Request  on June 26, 2010 12:17 PM  Refills Requested: Medication #1:  HYDROCODONE-ACETAMINOPHEN 5-325 MG TABS Take 2  tablets by mouth three times a day as needed for pain   Last Refilled: 05/27/2010 # 180   Last UDS  --  never   Method Requested: Fax to Local Pharmacy Initial call taken by: Merrie Roof RN,  June 26, 2010 12:18 PM  Follow-up for Phone Call        Will refill until his app't this month and will get UDS at that time. Follow-up by: Ulyess Mort MD,  July 02, 2010 11:48 AM  Additional Follow-up for Phone Call Additional follow up Details #1::        Rx faxed to pharmacy Additional Follow-up by: Merrie Roof RN,  July 03, 2010 9:32 AM    Prescriptions: HYDROCODONE-ACETAMINOPHEN 5-325 MG TABS (HYDROCODONE-ACETAMINOPHEN) Take 2  tablets by mouth three times a day as needed for pain  #180 x 0   Entered and Authorized by:   Ulyess Mort MD   Signed by:   Ulyess Mort MD on 07/02/2010   Method used:   Telephoned to ...       [facility] (retail)       [address]       [place], [place]  [address]       Ph: [phone]       Fax: [phone]   RxID:   [id number]

## 2010-12-02 ENCOUNTER — Telehealth: Payer: Self-pay | Admitting: *Deleted

## 2010-12-02 DIAGNOSIS — M545 Low back pain: Secondary | ICD-10-CM

## 2010-12-02 NOTE — Telephone Encounter (Signed)
Last refill 10/31/10 Takes 2  Tid as needed

## 2010-12-03 ENCOUNTER — Other Ambulatory Visit: Payer: Self-pay | Admitting: Internal Medicine

## 2010-12-03 DIAGNOSIS — M545 Low back pain, unspecified: Secondary | ICD-10-CM

## 2010-12-03 MED ORDER — HYDROCODONE-ACETAMINOPHEN 5-500 MG PO CAPS: 2.0000 | ORAL_CAPSULE | Freq: Three times a day (TID) | ORAL | Status: DC | PRN

## 2010-12-03 MED ORDER — HYDROCODONE-ACETAMINOPHEN 5-325 MG PO TABS: 2.0000 | ORAL_TABLET | Freq: Three times a day (TID) | ORAL | Status: AC | PRN

## 2010-12-03 NOTE — Telephone Encounter (Signed)
phonein

## 2011-01-10 ENCOUNTER — Ambulatory Visit (INDEPENDENT_AMBULATORY_CARE_PROVIDER_SITE_OTHER): Payer: Medicaid Other | Admitting: Internal Medicine

## 2011-01-10 ENCOUNTER — Encounter: Payer: Self-pay | Admitting: Internal Medicine

## 2011-01-10 DIAGNOSIS — K219 Gastro-esophageal reflux disease without esophagitis: Secondary | ICD-10-CM

## 2011-01-10 DIAGNOSIS — I10 Essential (primary) hypertension: Secondary | ICD-10-CM

## 2011-01-10 DIAGNOSIS — M545 Low back pain: Secondary | ICD-10-CM

## 2011-01-10 DIAGNOSIS — F1011 Alcohol abuse, in remission: Secondary | ICD-10-CM

## 2011-01-10 DIAGNOSIS — F329 Major depressive disorder, single episode, unspecified: Secondary | ICD-10-CM

## 2011-01-10 DIAGNOSIS — J449 Chronic obstructive pulmonary disease, unspecified: Secondary | ICD-10-CM

## 2011-01-10 LAB — COMPREHENSIVE METABOLIC PANEL
ALT: 12 U/L (ref 0–53)
AST: 17 U/L (ref 0–37)
Albumin: 4.6 g/dL (ref 3.5–5.2)
Alkaline Phosphatase: 62 U/L (ref 39–117)
BUN: 18 mg/dL (ref 6–23)
CO2: 26 mEq/L (ref 19–32)
Calcium: 9.4 mg/dL (ref 8.4–10.5)
Chloride: 100 mEq/L (ref 96–112)
Creat: 0.8 mg/dL (ref 0.40–1.50)
Glucose, Bld: 101 mg/dL — ABNORMAL HIGH (ref 70–99)
Potassium: 4.5 mEq/L (ref 3.5–5.3)
Sodium: 137 mEq/L (ref 135–145)
Total Bilirubin: 0.6 mg/dL (ref 0.3–1.2)
Total Protein: 6.7 g/dL (ref 6.0–8.3)

## 2011-01-10 NOTE — Assessment & Plan Note (Signed)
Sees a GI doc in [place].  On Nexium

## 2011-01-10 NOTE — Assessment & Plan Note (Signed)
On stable vicodin: 2x5 mg tid. No problems with this.

## 2011-01-10 NOTE — Assessment & Plan Note (Signed)
Abstinate.

## 2011-01-10 NOTE — Assessment & Plan Note (Signed)
Continues to smoke 4 cigarettes a day.  No wheezing or chronic sputum production.  No longer on albuterol (says medicaid won't pay for it).  Does not need it.

## 2011-01-10 NOTE — Assessment & Plan Note (Signed)
Good response to zoloft.  Other than sleeping, has good energy and good spirits.  Active around house. No ill thoughts.

## 2011-01-10 NOTE — Progress Notes (Signed)
Addended by: Alric Quan on: 01/10/2011 10:37 AM   Modules accepted: Orders

## 2011-01-10 NOTE — Progress Notes (Signed)
28 man doing very well.  Only copmplaint is insomnia.  He drinks regular coffee, regular tea, coke and chocolate all day!  Has not liked de-caf in past.  Will give it another try.

## 2011-01-10 NOTE — Assessment & Plan Note (Signed)
BP = 136/73.  No chest pain or orthopnea. Cor regular w/o murmur.  Lungs silent.  No edema.

## 2011-01-25 ENCOUNTER — Other Ambulatory Visit: Payer: Self-pay | Admitting: Internal Medicine

## 2011-02-07 ENCOUNTER — Encounter: Payer: Self-pay | Admitting: Ophthalmology

## 2011-02-10 ENCOUNTER — Encounter: Payer: Self-pay | Admitting: Ophthalmology

## 2011-02-13 ENCOUNTER — Telehealth: Payer: Self-pay | Admitting: *Deleted

## 2011-02-13 NOTE — Telephone Encounter (Signed)
Pt called to say he was having a problem getting his lipitor, called pharm, needs PA, they are faxing the PA info sheet

## 2011-02-14 ENCOUNTER — Other Ambulatory Visit: Payer: Self-pay | Admitting: Internal Medicine

## 2011-02-21 ENCOUNTER — Telehealth: Payer: Self-pay | Admitting: *Deleted

## 2011-02-21 NOTE — Telephone Encounter (Signed)
Called [phone] for prior authorization on Atorvastatin - the company prefers Crestor and Simvastatin first. Note sent to Dr Aundria Rud. Pt uses [facility] [phone] or fax [phone]. Thanks [employer]

## 2011-03-03 ENCOUNTER — Other Ambulatory Visit: Payer: Self-pay | Admitting: Internal Medicine

## 2011-03-18 ENCOUNTER — Other Ambulatory Visit: Payer: Self-pay | Admitting: Internal Medicine

## 2011-03-19 NOTE — Telephone Encounter (Signed)
Called to pharm 

## 2011-05-12 NOTE — Telephone Encounter (Signed)
Insurance will no longer pay for atorvastatin..please change to crestor or simvastatin under the meds and orders tab.Leonard Spittle Cassady7/16/20129:57 AM

## 2011-05-13 ENCOUNTER — Other Ambulatory Visit: Payer: Self-pay | Admitting: Internal Medicine

## 2011-05-13 MED ORDER — SIMVASTATIN 40 MG PO TABS: 40.0000 mg | ORAL_TABLET | Freq: Every day | ORAL | Status: DC

## 2011-07-01 ENCOUNTER — Other Ambulatory Visit: Payer: Self-pay | Admitting: Internal Medicine

## 2011-07-01 NOTE — Telephone Encounter (Signed)
Refill called to the [facility] in [place].

## 2011-07-28 ENCOUNTER — Other Ambulatory Visit: Payer: Self-pay | Admitting: Internal Medicine

## 2011-07-31 NOTE — Telephone Encounter (Signed)
Last appt/CMP 3/16;  Next appt 11/2  W/Dr Aundria Rud.

## 2011-08-13 ENCOUNTER — Other Ambulatory Visit: Payer: Medicaid Other

## 2011-08-13 ENCOUNTER — Telehealth: Payer: Self-pay | Admitting: *Deleted

## 2011-08-13 ENCOUNTER — Ambulatory Visit (HOSPITAL_COMMUNITY)
Admission: RE | Admit: 2011-08-13 | Discharge: 2011-08-13 | Disposition: A | Discharge status: Home / Self Care | Payer: Medicaid Other | Source: Ambulatory Visit | Attending: Internal Medicine | Admitting: Internal Medicine

## 2011-08-13 DIAGNOSIS — E875 Hyperkalemia: Secondary | ICD-10-CM

## 2011-08-13 DIAGNOSIS — I498 Other specified cardiac arrhythmias: Secondary | ICD-10-CM | POA: Insufficient documentation

## 2011-08-13 LAB — BASIC METABOLIC PANEL WITH GFR
CO2: 28 mEq/L (ref 19–32)
Calcium: 10.1 mg/dL (ref 8.4–10.5)
Creat: 0.7 mg/dL (ref 0.50–1.35)
GFR, Est African American: >90 mL/min (ref >90)
Sodium: 137 mEq/L (ref 135–145)

## 2011-08-13 NOTE — Telephone Encounter (Signed)
Review of Leonard Massey' chart reveals that he is on an ACEI and has normal renal function.  His potassium ranges from 4.1-5 over the last 4 years with most values above 4.5.  A truly elevated potassium of 5.8 seems unlikely and is probably artifactual.  To be on the safe side will ask that Leonard Massey present for a BMet today along with an ECG.  Further decisions will be based upon the results of those studies.

## 2011-08-13 NOTE — Telephone Encounter (Signed)
Pt saw dr [name] in [place] for multiple GI complaints, tabitha at dr gupta's office calls w/ critical K+ of 5.8, she is faxing office notes and lab values, pt is at home awaiting call

## 2011-08-13 NOTE — Telephone Encounter (Signed)
Mr. Cavallaro needs to stay for review of his ECG.  He can decide if he would like to stay until the lab results are back.  If markedly elevated he may require further intervention and if he returned to [place] it is possible he would be asked to return.

## 2011-08-13 NOTE — Telephone Encounter (Signed)
Spoke w/ pt he will be at clinic between 1100 and 1130, he is driving from [place], family will be with him.

## 2011-08-29 ENCOUNTER — Ambulatory Visit (INDEPENDENT_AMBULATORY_CARE_PROVIDER_SITE_OTHER): Payer: Medicaid Other | Admitting: Internal Medicine

## 2011-08-29 ENCOUNTER — Encounter: Payer: Self-pay | Admitting: Internal Medicine

## 2011-08-29 VITALS — BP 143/77 | HR 53 | Temp 97.0°F | Wt 140.4 lb

## 2011-08-29 DIAGNOSIS — I10 Essential (primary) hypertension: Secondary | ICD-10-CM

## 2011-08-29 DIAGNOSIS — Z23 Encounter for immunization: Secondary | ICD-10-CM

## 2011-08-29 MED ORDER — ZOSTER VACCINE LIVE 19400 UNT/0.65ML ~~LOC~~ SOLR: 0.6500 mL | Freq: Once | SUBCUTANEOUS | Status: AC

## 2011-08-29 NOTE — Progress Notes (Signed)
71 man with mild HTN, depression, and osteoarthritis.  On stable meds as listed.  Off albuterol for 1 year due to cost. Does not seem to need this.  Recent care from Dr. [name], his GI doc in [place], for UGI sx and mid-October note says "Will proceed with EGD".  On new PPI.  No CV sx: no angina, orthopnea, edema.  Has DOE probably due to smoking and decreased activity.  Mood is fairly good and stable.  Active around house.  Appetite and digestion are OK (recent decrease due to "ulcer" sx.  Reviewed lipids:  assays in 08, 09, and 2011 all show LDL around 80 and HDL around 30 - 40.  This is not going to change.  BP is controlled.  Bmet 2 weeks ago was entirely normal.  Cor regular w/o murmur.  Lungs - decreased sounds.  No edema.  Takes vicodin as prescribed.  Says it works and he is more active due to pain relief.  No problems with this therapy.

## 2011-08-29 NOTE — Progress Notes (Signed)
Addended by: Maura Crandall on: 08/29/2011 11:18 AM   Modules accepted: Orders

## 2011-09-10 ENCOUNTER — Other Ambulatory Visit: Payer: Self-pay | Admitting: Internal Medicine

## 2011-10-27 ENCOUNTER — Telehealth: Payer: Self-pay | Admitting: Internal Medicine

## 2011-10-31 NOTE — Telephone Encounter (Signed)
Pt on chronic pain med per FYI note.  Refill approved and sent to MD for signature.Leonard Spittle Cassady1/4/20133:52 PM

## 2011-11-03 ENCOUNTER — Other Ambulatory Visit: Payer: Self-pay | Admitting: *Deleted

## 2011-11-03 ENCOUNTER — Other Ambulatory Visit: Payer: Self-pay | Admitting: Internal Medicine

## 2011-11-03 NOTE — Telephone Encounter (Signed)
Refill has been done and was called to the [facility] this am.  Angelina Ok, RN 11/03/2011 10:30 AM

## 2012-01-21 ENCOUNTER — Other Ambulatory Visit: Payer: Self-pay | Admitting: Internal Medicine

## 2012-01-21 NOTE — Telephone Encounter (Signed)
Hydrocodone 5/325mg rx called to [facility] pharmacy. 

## 2012-02-13 ENCOUNTER — Ambulatory Visit: Payer: Medicaid Other | Admitting: Internal Medicine

## 2012-02-25 ENCOUNTER — Other Ambulatory Visit: Payer: Self-pay | Admitting: Internal Medicine

## 2012-02-26 NOTE — Telephone Encounter (Signed)
Hydrocodone 5/325mg rx called to [facility] pharmacy. 

## 2012-03-12 ENCOUNTER — Other Ambulatory Visit: Payer: Self-pay | Admitting: Internal Medicine

## 2012-03-19 ENCOUNTER — Encounter: Payer: Self-pay | Admitting: Internal Medicine

## 2012-03-19 ENCOUNTER — Ambulatory Visit (INDEPENDENT_AMBULATORY_CARE_PROVIDER_SITE_OTHER): Payer: Medicaid Other | Admitting: Internal Medicine

## 2012-03-19 VITALS — BP 119/76 | HR 56 | Temp 96.0°F | Wt 148.7 lb

## 2012-03-19 DIAGNOSIS — J4489 Other specified chronic obstructive pulmonary disease: Secondary | ICD-10-CM

## 2012-03-19 DIAGNOSIS — J449 Chronic obstructive pulmonary disease, unspecified: Secondary | ICD-10-CM

## 2012-03-19 DIAGNOSIS — F3289 Other specified depressive episodes: Secondary | ICD-10-CM

## 2012-03-19 DIAGNOSIS — I10 Essential (primary) hypertension: Secondary | ICD-10-CM

## 2012-03-19 DIAGNOSIS — M545 Low back pain, unspecified: Secondary | ICD-10-CM

## 2012-03-19 DIAGNOSIS — E785 Hyperlipidemia, unspecified: Secondary | ICD-10-CM

## 2012-03-19 DIAGNOSIS — G458 Other transient cerebral ischemic attacks and related syndromes: Secondary | ICD-10-CM

## 2012-03-19 DIAGNOSIS — F329 Major depressive disorder, single episode, unspecified: Secondary | ICD-10-CM

## 2012-03-19 LAB — BASIC METABOLIC PANEL
Calcium: 9.6 mg/dL (ref 8.4–10.5)
Creat: 0.91 mg/dL (ref 0.50–1.35)
Sodium: 141 mEq/L (ref 135–145)

## 2012-03-19 NOTE — Assessment & Plan Note (Signed)
Last test in 2011 with usual HDL low and LDL around 80.  On simvastatin.  Doubt need for further testing, certainly not every year.

## 2012-03-19 NOTE — Assessment & Plan Note (Signed)
On stable vicodin 5mg  -- 6 tabs per day.  Pain relief allows better function.  No concerns.

## 2012-03-19 NOTE — Assessment & Plan Note (Signed)
Quiet but seems placid.  Lives with girlfriend.  Eating and sleeping well.  Busy with hobbies.

## 2012-03-19 NOTE — Assessment & Plan Note (Signed)
Quit smoking last month.  Stable mild DOE.

## 2012-03-19 NOTE — Progress Notes (Signed)
53 man with stable chronic problems.  No complaints today.

## 2012-03-19 NOTE — Assessment & Plan Note (Signed)
No bruits.  Lungs clear.  Cor regular w/o murmur.  No edema. No chest pain or orthopnea. 119/76 despite weight gain.

## 2012-03-19 NOTE — Assessment & Plan Note (Signed)
Operated for this a decade ago.  No further or current CNS sx.

## 2012-04-02 ENCOUNTER — Other Ambulatory Visit: Payer: Self-pay | Admitting: Internal Medicine

## 2012-05-12 ENCOUNTER — Other Ambulatory Visit: Payer: Self-pay | Admitting: Internal Medicine

## 2012-05-26 ENCOUNTER — Other Ambulatory Visit: Payer: Self-pay | Admitting: Internal Medicine

## 2012-07-20 ENCOUNTER — Other Ambulatory Visit: Payer: Self-pay | Admitting: Internal Medicine

## 2012-07-27 ENCOUNTER — Other Ambulatory Visit: Payer: Self-pay | Admitting: Internal Medicine

## 2012-07-30 ENCOUNTER — Ambulatory Visit (INDEPENDENT_AMBULATORY_CARE_PROVIDER_SITE_OTHER): Payer: Medicaid Other | Admitting: *Deleted

## 2012-07-30 DIAGNOSIS — Z23 Encounter for immunization: Secondary | ICD-10-CM

## 2012-08-03 ENCOUNTER — Other Ambulatory Visit: Payer: Self-pay | Admitting: Internal Medicine

## 2012-10-07 ENCOUNTER — Encounter: Payer: Self-pay | Admitting: Radiation Oncology

## 2012-10-07 ENCOUNTER — Ambulatory Visit (INDEPENDENT_AMBULATORY_CARE_PROVIDER_SITE_OTHER): Payer: Medicaid Other | Admitting: Radiation Oncology

## 2012-10-07 VITALS — BP 119/71 | HR 53 | Temp 97.3°F | Ht 67.0 in | Wt 137.3 lb

## 2012-10-07 DIAGNOSIS — F329 Major depressive disorder, single episode, unspecified: Secondary | ICD-10-CM

## 2012-10-07 DIAGNOSIS — E785 Hyperlipidemia, unspecified: Secondary | ICD-10-CM

## 2012-10-07 DIAGNOSIS — F3289 Other specified depressive episodes: Secondary | ICD-10-CM

## 2012-10-07 DIAGNOSIS — M545 Low back pain, unspecified: Secondary | ICD-10-CM

## 2012-10-07 DIAGNOSIS — J449 Chronic obstructive pulmonary disease, unspecified: Secondary | ICD-10-CM

## 2012-10-07 DIAGNOSIS — J4489 Other specified chronic obstructive pulmonary disease: Secondary | ICD-10-CM

## 2012-10-07 DIAGNOSIS — K219 Gastro-esophageal reflux disease without esophagitis: Secondary | ICD-10-CM

## 2012-10-07 DIAGNOSIS — R200 Anesthesia of skin: Secondary | ICD-10-CM

## 2012-10-07 DIAGNOSIS — R202 Paresthesia of skin: Secondary | ICD-10-CM

## 2012-10-07 DIAGNOSIS — I10 Essential (primary) hypertension: Secondary | ICD-10-CM

## 2012-10-07 MED ORDER — SERTRALINE HCL 50 MG PO TABS: 50.0000 mg | ORAL_TABLET | Freq: Every day | ORAL | Status: DC

## 2012-10-07 MED ORDER — HYDROCODONE-ACETAMINOPHEN 5-325 MG PO TABS: 2.0000 | ORAL_TABLET | Freq: Three times a day (TID) | ORAL | Status: DC | PRN

## 2012-10-07 MED ORDER — ATENOLOL 25 MG PO TABS: 25.0000 mg | ORAL_TABLET | Freq: Every day | ORAL | Status: DC

## 2012-10-07 MED ORDER — ESOMEPRAZOLE MAGNESIUM 40 MG PO CPDR: 40.0000 mg | DELAYED_RELEASE_CAPSULE | Freq: Every day | ORAL | Status: DC

## 2012-10-07 MED ORDER — LISINOPRIL-HYDROCHLOROTHIAZIDE 20-12.5 MG PO TABS: 1.0000 | ORAL_TABLET | Freq: Every day | ORAL | Status: DC

## 2012-10-07 MED ORDER — SIMVASTATIN 40 MG PO TABS: 40.0000 mg | ORAL_TABLET | Freq: Every day | ORAL | Status: DC

## 2012-10-07 NOTE — Assessment & Plan Note (Signed)
Patient doing well on current opioid regimen, therefore no changes necessary at today's visit. Will continue Norco 5 mg 6 tabs per day.

## 2012-10-07 NOTE — Progress Notes (Signed)
  Subjective:    Patient ID: Leonard Massey, male    DOB: [birth year], 66 y.o.   MRN: [id number]  HPI Patient is a 66 year old man who presents to clinic today for routine office visit. The patient's only new complaint today is of numbness in the palm of his right hand, which has been present for approximately 3 weeks. The patient states the numbness was first noticed after he awoke from sleep one night approximately 3 weeks prior, it has persisted unchanged since that time. Patient denies any weakness or pain associated numbness. He denies any numbness elsewhere or any other symptoms. He denies any limitations in range of motion of his right upper extremity. Regarding chronic complaints, the patient complains of chronic neck pain, and chronic back pain. He states this chronic pain is effectively managed with his chronic opioid therapy regimen. The patient denies any other complaints today, and states that he feels well. He requests a refill of all his medications.   Review of Systems  Constitutional: Negative for fever and chills.  HENT: Positive for neck pain.   Eyes: Negative.   Respiratory: Negative for cough and shortness of breath.   Cardiovascular: Negative for chest pain and leg swelling.  Gastrointestinal: Negative for nausea, vomiting, diarrhea and blood in stool.  Genitourinary: Negative.   Musculoskeletal: Positive for back pain.  Skin: Negative.   Neurological: Positive for numbness (r hand).  Hematological: Negative.   Psychiatric/Behavioral: Negative.        Objective:   Physical Exam  Constitutional: He is oriented to person, place, and time. He appears well-developed and well-nourished. No distress.  HENT:  Head: Normocephalic and atraumatic.  Eyes: Pupils are equal, round, and reactive to light. No scleral icterus.  Neck: Normal range of motion. Neck supple. No tracheal deviation present. No thyromegaly present.  Cardiovascular: Normal rate and regular rhythm.   No murmur  heard. Pulmonary/Chest: Effort normal. He has no wheezes. He has no rales.  Abdominal: Soft. Bowel sounds are normal. He exhibits no distension. There is no tenderness.  Musculoskeletal: Normal range of motion. He exhibits no edema and no tenderness.  Neurological: He is alert and oriented to person, place, and time.       Decreased sensation in palm of R hand. 5/5 bilateral hand grip strength. Full ROM in R wrist. Anisocoria: L pupillary diameter: ~17mm; R pupillary diameter: ~87mm  Skin: Skin is warm and dry. No erythema.  Psychiatric: He has a normal mood and affect. His behavior is normal.          Assessment & Plan:

## 2012-10-07 NOTE — Assessment & Plan Note (Signed)
Patient states this issue is well controlled on sertraline.

## 2012-10-07 NOTE — Assessment & Plan Note (Signed)
BP Readings from Last 3 Encounters:  10/07/12 119/71  03/19/12 119/76  08/29/11 143/77    Lab Results  Component Value Date   NA 141 03/19/2012   K 4.9 03/19/2012   CREATININE 0.91 03/19/2012    Assessment:  Blood pressure control: controlled  Progress toward BP goal:  at goal  Comments:   Plan:  Medications:  continue current medications  Educational resources provided:    Self management tools provided:    Other plans:

## 2012-10-07 NOTE — Assessment & Plan Note (Signed)
Patient has a history of dyspnea on exertion, and states that his symptoms are stable and have not changed since previous visit.

## 2012-10-07 NOTE — Assessment & Plan Note (Addendum)
History of very good LDL control. In agreement with Dr. Doreene Adas assessment during patient's previous visit, there is no need to check patient's LDL at this visit as it has been stable for years.  Lab Results  Component Value Date   LDLCALC 83 01/04/2010

## 2012-10-07 NOTE — Assessment & Plan Note (Signed)
Well controlled on Nexium 

## 2012-10-07 NOTE — Patient Instructions (Signed)
General instructions:  You are doing an excellent job on taking care of your health. Keep up the great work, and continue to take your medications as prescribed. We look forward to seeing you back in approximately 6 months. Have a great day.

## 2012-10-28 ENCOUNTER — Other Ambulatory Visit: Payer: Self-pay | Admitting: Internal Medicine

## 2012-10-28 NOTE — Telephone Encounter (Signed)
Talked with pharmacy about refusal on med.

## 2012-10-28 NOTE — Telephone Encounter (Signed)
Dr  Lavena Bullion gave 6 month supply 09/2012, Should have refills

## 2012-10-29 NOTE — Telephone Encounter (Signed)
Rx called in to pharmacy. 

## 2013-01-18 DIAGNOSIS — I779 Disorder of arteries and arterioles, unspecified: Secondary | ICD-10-CM | POA: Insufficient documentation

## 2013-02-21 LAB — PULMONARY FUNCTION TEST

## 2013-04-20 ENCOUNTER — Other Ambulatory Visit: Payer: Self-pay | Admitting: *Deleted

## 2013-04-20 MED ORDER — HYDROCODONE-ACETAMINOPHEN 5-325 MG PO TABS: 2.0000 | ORAL_TABLET | Freq: Three times a day (TID) | ORAL | Status: DC | PRN

## 2013-04-20 NOTE — Telephone Encounter (Signed)
Last filled 6/2 per pharmacy

## 2013-04-20 NOTE — Telephone Encounter (Signed)
Rx called in 

## 2013-04-20 NOTE — Telephone Encounter (Signed)
Refill approved - nurse to call in. 

## 2013-04-26 ENCOUNTER — Other Ambulatory Visit: Payer: Self-pay | Admitting: Internal Medicine

## 2013-04-26 NOTE — Telephone Encounter (Signed)
CALLED TO PHARM.

## 2013-04-26 NOTE — Telephone Encounter (Signed)
Please call in

## 2013-06-02 ENCOUNTER — Ambulatory Visit (INDEPENDENT_AMBULATORY_CARE_PROVIDER_SITE_OTHER): Payer: Medicare Other | Admitting: Internal Medicine

## 2013-06-02 ENCOUNTER — Encounter: Payer: Self-pay | Admitting: Internal Medicine

## 2013-06-02 VITALS — BP 143/81 | HR 67 | Temp 97.2°F | Ht 66.5 in | Wt 135.7 lb

## 2013-06-02 DIAGNOSIS — M545 Low back pain: Secondary | ICD-10-CM

## 2013-06-02 DIAGNOSIS — M25569 Pain in unspecified knee: Secondary | ICD-10-CM

## 2013-06-02 DIAGNOSIS — J449 Chronic obstructive pulmonary disease, unspecified: Secondary | ICD-10-CM

## 2013-06-02 DIAGNOSIS — G458 Other transient cerebral ischemic attacks and related syndromes: Secondary | ICD-10-CM

## 2013-06-02 DIAGNOSIS — F329 Major depressive disorder, single episode, unspecified: Secondary | ICD-10-CM

## 2013-06-02 DIAGNOSIS — M25611 Stiffness of right shoulder, not elsewhere classified: Secondary | ICD-10-CM

## 2013-06-02 DIAGNOSIS — R209 Unspecified disturbances of skin sensation: Secondary | ICD-10-CM

## 2013-06-02 DIAGNOSIS — I10 Essential (primary) hypertension: Secondary | ICD-10-CM

## 2013-06-02 DIAGNOSIS — M259 Joint disorder, unspecified: Secondary | ICD-10-CM

## 2013-06-02 DIAGNOSIS — E785 Hyperlipidemia, unspecified: Secondary | ICD-10-CM

## 2013-06-02 DIAGNOSIS — R200 Anesthesia of skin: Secondary | ICD-10-CM

## 2013-06-02 DIAGNOSIS — F17200 Nicotine dependence, unspecified, uncomplicated: Secondary | ICD-10-CM

## 2013-06-02 MED ORDER — SERTRALINE HCL 50 MG PO TABS: 50.0000 mg | ORAL_TABLET | Freq: Every day | ORAL | Status: DC

## 2013-06-02 MED ORDER — LISINOPRIL-HYDROCHLOROTHIAZIDE 20-12.5 MG PO TABS: 1.0000 | ORAL_TABLET | Freq: Every day | ORAL | Status: DC

## 2013-06-02 MED ORDER — ZOSTER VACCINE LIVE 19400 UNT/0.65ML ~~LOC~~ SOLR: 0.6500 mL | Freq: Once | SUBCUTANEOUS | Status: DC

## 2013-06-02 NOTE — Assessment & Plan Note (Signed)
Well controlled with current pain medication Norco 5-325 2pills TID.  No signs of abuse.

## 2013-06-02 NOTE — Assessment & Plan Note (Signed)
Will refer for PT eval.

## 2013-06-02 NOTE — Assessment & Plan Note (Signed)
Patient reports recently using 1/3 PPD.  Has quit in past but is concerned he cannot do it without a pill to help.  Patient has never tried counciling.  Patient was informed of [place] Quitline number, and I will also refer him to our social worker to discuss resources.  If still unable to quit we will discuss pharmacologic aids to quitting.

## 2013-06-02 NOTE — Assessment & Plan Note (Signed)
Has been compliant with simvastatin 40mg  will check Lipid panel today and CMP.

## 2013-06-02 NOTE — Patient Instructions (Signed)
1. Physical Therapy will contact you about appointment to evaluate your shoulder. 2.  Try taking Naproxen over the counter for anti inflammatory properties. 3.  Continue to take Nexium OTC. 4. You are doing a great job with your medications, continue to take them as prescribed. 5.  If you labs are abnormal I will give you a call and update you.

## 2013-06-02 NOTE — Assessment & Plan Note (Signed)
Stable, no active concerns, continue zoloft.

## 2013-06-02 NOTE — Assessment & Plan Note (Signed)
BP Readings from Last 3 Encounters:  06/02/13 143/81  10/07/12 119/71  03/19/12 119/76    Lab Results  Component Value Date   NA 141 03/19/2012   K 4.9 03/19/2012   CREATININE 0.91 03/19/2012    Assessment: Blood pressure control: mildly elevated Progress toward BP goal:    Comments: doing very well, elevated to 143/80 but last two visits have been under control.  Will continue current medication.  Plan: Medications:  continue current medications Educational resources provided:   Self management tools provided:   Other plans: continue current medications

## 2013-06-02 NOTE — Assessment & Plan Note (Addendum)
Had recent PFTs sees pulmonologist in [facility]), records requested. Reports using albuterol neb three times a day. Also reports receiving Pneumovax there.  No current issues Smoking cessation encouraged.

## 2013-06-02 NOTE — Progress Notes (Signed)
Patient ID: Leonard Massey, male   DOB: 03-04-1946, 67 y.o.   MRN: [id number]   Subjective:   Patient ID: Leonard Massey male   DOB: 07-Nov-1945 67 y.o.   MRN: [id number]  HPI: [name] is a 67 y.o. man who presents today for regular follow up appointment.  His chief concern today is right shoulder pain that he rates as a 10/10 pain at worst.  This pain has been ongoing for 2 months since a mechanical fall in the kitchen and landed on right shoulder.  He reports that the pain is worst when trying to rotate arm backwards behind is back.  He reports associated symptoms of pins and needles in his right arm  (has been present since before fall). No other current complaints.  Been taking medications as perscribed.   Past Medical History  Diagnosis Date  . COPD (chronic obstructive pulmonary disease)   . Depression   . GERD (gastroesophageal reflux disease)     Pt reports having EGD/colonoscopy in 2010 by Dr. [name] in [place] wich showed ulcers on uppper and normal colon. No report in emr.   . Hyperlipidemia     Pt typically has low HDL and low LDL.   [name] Hypertension   . Low back pain   . Cervical spondylosis   . Alcoholism   . Subclavian steal syndrome     HX of.   . Right knee pain 8/10    Began to complain of after requesting a scooter.  Exam was negative and it was decided that pt should continue to ambulate.    Current Outpatient Prescriptions  Medication Sig Dispense Refill  . aspirin 81 MG EC tablet Take 81 mg by mouth daily.        [name] atenolol (TENORMIN) 25 MG tablet Take 1 tablet (25 mg total) by mouth daily.  31 tablet  11  . esomeprazole (NEXIUM) 40 MG capsule Take 1 capsule (40 mg total) by mouth daily before breakfast.  30 capsule  11  . HYDROcodone-acetaminophen (NORCO/VICODIN) 5-325 MG per tablet TAKE 2 TABLETS BY MOUTH THREE TIMES DAILY  180 tablet  3  . lisinopril-hydrochlorothiazide (PRINZIDE,ZESTORETIC) 20-12.5 MG per tablet Take 1 tablet by mouth daily.  30 tablet  6  .  sertraline (ZOLOFT) 50 MG tablet Take 1 tablet (50 mg total) by mouth daily.  30 tablet  6  . simvastatin (ZOCOR) 40 MG tablet Take 1 tablet (40 mg total) by mouth at bedtime.  30 tablet  10   No current facility-administered medications for this visit.   No family history on file. History   Social History  . Marital Status: Divorced    Spouse Name: [name]    Number of Children: [name]  . Years of Education: [name]   Social History Main Topics  . Smoking status: Former Smoker -- 0.30 packs/day    Types: Cigarettes  . Smokeless tobacco: Former Neurosurgeon    Quit date: 02/01/2012  . Alcohol Use: Not on file  . Drug Use: Not on file  . Sexually Active: Not on file   Other Topics Concern  . Not on file   Social History Narrative   Disabled several years, On [place] and [place].   Lives with disabled girlfriend in a trailer.  Works around [employer] and keeps busy.    Review of Systems: Review of Systems  Constitutional: Negative for fever, chills and weight loss.  HENT: Positive for hearing loss (chronic right ear, doesnt always use  hearing aid) and neck pain. Negative for nosebleeds.   Eyes: Negative for blurred vision and double vision.  Respiratory: Positive for cough (smoking). Negative for shortness of breath (improved after seeing pulmonology).   Cardiovascular: Negative for chest pain, palpitations and leg swelling.  Gastrointestinal: Negative for heartburn, nausea, vomiting, abdominal pain, diarrhea and constipation.  Genitourinary: Negative for dysuria and frequency.  Musculoskeletal: Negative for myalgias.  Skin: Negative for rash.  Neurological: Positive for sensory change (right arm tingling) and headaches (occasional migraines). Negative for dizziness, focal weakness and weakness.    Objective:  Physical Exam: Filed Vitals:   06/02/13 1526  BP: 143/81  Pulse: 67  Temp: 97.2 F (36.2 C)  TempSrc: Oral  Height: 5' 6.5" (1.689 m)  Weight: 135 lb 11.2 oz (61.553 kg)  SpO2:  95%   Physical Exam  Constitutional: He is well-developed, well-nourished, and in no distress. No distress.  HENT:  Head: Normocephalic and atraumatic.  Neck: Normal range of motion. Neck supple.  Cardiovascular: Normal rate and regular rhythm.   No murmur heard. Pulmonary/Chest: Effort normal and breath sounds normal. No respiratory distress. He has no wheezes.  Abdominal: Soft. Bowel sounds are normal. He exhibits no distension. There is no tenderness.  Musculoskeletal:  Unable to raise right arm past 90 degrees, negative drop arm test, 10 cm difference in back scratch test between right and left arms.  No pain on palpation of right shoulder.  Skin: He is not diaphoretic.    Assessment & Plan:   See problem based charting.

## 2013-06-02 NOTE — Assessment & Plan Note (Signed)
Well controlled with current pain medication Norco 5-325 2pills TID.  No signs of abuse. 

## 2013-06-02 NOTE — Assessment & Plan Note (Signed)
Follows with [name] in [facility].

## 2013-06-02 NOTE — Assessment & Plan Note (Signed)
Patient continues to have some limited numbness and tingling.  Continues to have 5/5 muscle strength in upper extremity.  Patient is currently being referred to PT for eval of right shoulder.  Will continue to monitor and refer to neurosurgery for further eval if patient does not find relief from PT.

## 2013-06-03 LAB — COMPLETE METABOLIC PANEL WITH GFR
ALT: 9 U/L (ref 0–53)
AST: 16 U/L (ref 0–37)
CO2: 27 mEq/L (ref 19–32)
Calcium: 9.8 mg/dL (ref 8.4–10.5)
Chloride: 102 mEq/L (ref 96–112)
GFR, Est African American: >89 mL/min
Potassium: 4.6 mEq/L (ref 3.5–5.3)
Sodium: 138 mEq/L (ref 135–145)
Total Protein: 7.1 g/dL (ref 6.0–8.3)

## 2013-06-03 LAB — LIPID PANEL
LDL Cholesterol: 77 mg/dL (ref 0–99)
VLDL: 24 mg/dL (ref 0–40)

## 2013-06-06 ENCOUNTER — Telehealth: Payer: Self-pay | Admitting: Licensed Clinical Social Worker

## 2013-06-06 NOTE — Telephone Encounter (Signed)
CSW placed call to Mr. Hocker to explore smoking cessation techniques.  Pt states he has thought about quitting.  CSW unable to assess how motivated pt is to quit at this time.  Pt currently smoking 3-4 or 5-6 cigarettes a day and is aware smoking exacerbates his bronchitis.  Mr. Pritchard currently smokes Ray Full Flavored.  Pt has tried NRT patches in the past but has not tried lozenge or gum NRT.  CSW encouraged and provided Mr. Torr with the number to the [facility] Quitline.  Discussed nicotine tapering with Mr. Hobin and will send information out regarding tapering schedule.  Mr. Dente denies add'l needs at this time and is aware CSW is available to assist as needed.

## 2013-06-06 NOTE — Progress Notes (Signed)
I saw and evaluated the patient.  I personally confirmed the key portions of the history and exam documented by Dr. Hoffman and I reviewed pertinent patient test results.  The assessment, diagnosis, and plan were formulated together and I agree with the documentation in the resident's note. 

## 2013-07-08 ENCOUNTER — Ambulatory Visit: Payer: Medicare Other | Admitting: Internal Medicine

## 2013-07-18 NOTE — Addendum Note (Signed)
Addended by: Neomia Dear on: 07/18/2013 07:06 PM   Modules accepted: Orders

## 2013-08-23 ENCOUNTER — Other Ambulatory Visit: Payer: Self-pay | Admitting: *Deleted

## 2013-08-23 ENCOUNTER — Other Ambulatory Visit: Payer: Self-pay | Admitting: Internal Medicine

## 2013-08-23 MED ORDER — HYDROCODONE-ACETAMINOPHEN 5-325 MG PO TABS: 2.0000 | ORAL_TABLET | Freq: Three times a day (TID) | ORAL | Status: DC | PRN

## 2013-08-23 NOTE — Telephone Encounter (Signed)
You may print 3 scripts at a time if you would like, oct, nov and dec. Per pharm last pick up of hydrocodone was 07/26/2013

## 2013-08-24 NOTE — Telephone Encounter (Signed)
Dr Mikey Bussing you must print this script, please consider printing 3, since pt lives a good distance from here you may add do not fill until... On each and we can give him 3 at a time. Just a thought to help on transportation cost

## 2013-08-24 NOTE — Telephone Encounter (Signed)
Rx ready - pt called; message left. 

## 2013-10-24 ENCOUNTER — Ambulatory Visit (INDEPENDENT_AMBULATORY_CARE_PROVIDER_SITE_OTHER): Payer: Medicare Other | Admitting: Pharmacist

## 2013-10-24 DIAGNOSIS — Z7901 Long term (current) use of anticoagulants: Secondary | ICD-10-CM | POA: Insufficient documentation

## 2013-10-24 DIAGNOSIS — I4891 Unspecified atrial fibrillation: Secondary | ICD-10-CM

## 2013-10-24 DIAGNOSIS — I4821 Permanent atrial fibrillation: Secondary | ICD-10-CM | POA: Insufficient documentation

## 2013-10-24 NOTE — Patient Instructions (Signed)
Patient instructed to take medications as defined in the Anti-coagulation Track section of this encounter.  Patient instructed to OMIT/HOLD dose for 2 consecutive doses/days (commencing today, Monday).  Patient verbalized understanding of these instructions.

## 2013-10-24 NOTE — Progress Notes (Signed)
Anti-Coagulation Progress Note  Leonard Massey is a 67 y.o. male who is currently on an anti-coagulation regimen.    RECENT RESULTS: Recent results are below, the most recent result is correlated with a dose of 5mg  PO QD since discharge on December 25 to December 28 with CONCOMITANT moxifloxicin for these 3 days revealing hypopthrombinemic response today. He is now finished with the moxifloxicine (last dose 28-DEC-14). Will OMIT 2 days of warfarin (today and tomorrow). 1 tablet Wednesday, 1/2 tablet Thursdays, repeat INR on Friday at 1000h. Lab Results  Component Value Date   INR 6.2 10/24/2013    ANTI-COAG DOSE: Anticoagulation Dose Instructions as of 10/24/2013     [name] Tue Wed Thu Fri Sat   New Dose 5 mg Hold Hold 5 mg 2.5 mg 0 mg 0 mg       ANTICOAG SUMMARY: Anticoagulation Episode Summary   Current INR goal 2.0-3.0  Next INR check 10/28/2013  INR from last check 6.2! (10/24/2013)  Weekly max dose   Target end date   [facility]  Preferred lab   Send INR reminders to    Indications  Atrial fibrillation [427.31] Long term (current) use of anticoagulants [V58.61]        Comments         ANTICOAG TODAY: Anticoagulation Summary as of 10/24/2013   INR goal 2.0-3.0  Selected INR 6.2! (10/24/2013)  Next INR check 10/28/2013  Target end date    Indications  Atrial fibrillation [427.31] Long term (current) use of anticoagulants [V58.61]      Anticoagulation Episode Summary   [facility] check [facility]   Preferred lab    Send INR reminders to    Comments       PATIENT INSTRUCTIONS: Patient Instructions  Patient instructed to take medications as defined in the Anti-coagulation Track section of this encounter.  Patient instructed to OMIT/HOLD dose for 2 consecutive doses/days (commencing today, Monday).  Patient verbalized understanding of these instructions.       FOLLOW-UP Return in 4 days (on 10/28/2013) for Follow up INR at  1000h.  [name], III Pharm.D., CACP

## 2013-10-28 ENCOUNTER — Ambulatory Visit (INDEPENDENT_AMBULATORY_CARE_PROVIDER_SITE_OTHER): Payer: Medicare Other | Admitting: Pharmacist

## 2013-10-28 DIAGNOSIS — I4891 Unspecified atrial fibrillation: Secondary | ICD-10-CM

## 2013-10-28 DIAGNOSIS — Z7901 Long term (current) use of anticoagulants: Secondary | ICD-10-CM

## 2013-10-28 LAB — POCT INR: INR: 2.7

## 2013-10-28 NOTE — Progress Notes (Signed)
Anti-Coagulation Progress Note  Casimer LeekJohn W Hansmann is a 68 y.o. male who is currently on an anti-coagulation regimen.    RECENT RESULTS: Recent results are below, the most recent result is correlated with a dose of having held warfarin x 2 days, then 1 tablet x 5mg  then 1/2 tablet x 5mg  (2.5mg ) then was seen today. Lab Results  Component Value Date   INR 2.70 10/28/2013   INR 6.2 10/24/2013    ANTI-COAG DOSE: Anticoagulation Dose Instructions as of 10/28/2013     Glynis SmilesSun Mon Tue Wed Thu Fri Sat   New Dose 2.5 mg 0 mg 0 mg 0 mg 0 mg 5 mg 5 mg       ANTICOAG SUMMARY: Anticoagulation Episode Summary   Current INR goal 2.0-3.0  Next INR check 10/31/2013  INR from last check 2.70 (10/28/2013)  Weekly max dose   Target end date   [facility]  Preferred lab   Send INR reminders to    Indications  Atrial fibrillation [427.31] Long term (current) use of anticoagulants [V58.61]        Comments         ANTICOAG TODAY: Anticoagulation Summary as of 10/28/2013   INR goal 2.0-3.0  Selected INR 2.70 (10/28/2013)  Next INR check 10/31/2013  Target end date    Indications  Atrial fibrillation [427.31] Long term (current) use of anticoagulants [V58.61]      Anticoagulation Episode Summary   [facility] check [facility]   Preferred lab    Send INR reminders to    Comments       PATIENT INSTRUCTIONS: Patient Instructions  Patient instructed to take medications as defined in the Anti-coagulation Track section of this encounter.  Patient instructed to take today's dose.  Patient verbalized understanding of these instructions.       FOLLOW-UP Return in 3 days (on 10/31/2013) for Follow up INR at 0945h.  Hulen LusterJames Corin Formisano, III Pharm.D., CACP

## 2013-10-28 NOTE — Patient Instructions (Signed)
Patient instructed to take medications as defined in the Anti-coagulation Track section of this encounter.  Patient instructed to take today's dose.  Patient verbalized understanding of these instructions.    

## 2013-10-31 ENCOUNTER — Ambulatory Visit (INDEPENDENT_AMBULATORY_CARE_PROVIDER_SITE_OTHER): Payer: Medicare Other | Admitting: Pharmacist

## 2013-10-31 DIAGNOSIS — Z7901 Long term (current) use of anticoagulants: Secondary | ICD-10-CM

## 2013-10-31 DIAGNOSIS — I4891 Unspecified atrial fibrillation: Secondary | ICD-10-CM

## 2013-10-31 LAB — POCT INR: INR: 2.9

## 2013-10-31 NOTE — Patient Instructions (Signed)
Patient instructed to take medications as defined in the Anti-coagulation Track section of this encounter.  Patient instructed to OMIT today's dose.  Patient verbalized understanding of these instructions.    

## 2013-10-31 NOTE — Progress Notes (Signed)
Anti-Coagulation Progress Note  Casimer LeekJohn W Bezek is a 68 y.o. male who is currently on an anti-coagulation regimen.    RECENT RESULTS: Recent results are below, the most recent result is correlated with a dose of 12.5mg  since last visit apportioned daily.  Lab Results  Component Value Date   INR 2.90 10/31/2013   INR 2.70 10/28/2013   INR 6.2 10/24/2013    ANTI-COAG DOSE: Anticoagulation Dose Instructions as of 10/31/2013     Glynis SmilesSun Mon Tue Wed Thu Fri Sat   New Dose 2.5 mg 2.5 mg 2.5 mg 2.5 mg 5 mg 2.5 mg 5 mg       ANTICOAG SUMMARY: Anticoagulation Episode Summary   Current INR goal 2.0-3.0  Next INR check 11/21/2013  INR from last check 2.90 (10/31/2013)  Weekly max dose   Target end date   [facility] check [facility]  Preferred lab   Send INR reminders to    Indications  Atrial fibrillation [427.31] Long term (current) use of anticoagulants [V58.61]        Comments         ANTICOAG TODAY: Anticoagulation Summary as of 10/31/2013   INR goal 2.0-3.0  Selected INR 2.90 (10/31/2013)  Next INR check 11/21/2013  Target end date    Indications  Atrial fibrillation [427.31] Long term (current) use of anticoagulants [V58.61]      Anticoagulation Episode Summary   [facility] check [facility]   Preferred lab    Send INR reminders to    Comments       PATIENT INSTRUCTIONS: Patient Instructions  Patient instructed to take medications as defined in the Anti-coagulation Track section of this encounter.  Patient instructed to OMIT today's dose.  Patient verbalized understanding of these instructions.       FOLLOW-UP Return in 3 weeks (on 11/21/2013) for Follow up INR at 0945h.  Hulen LusterJames Groce, III Pharm.D., CACP

## 2013-10-31 NOTE — Progress Notes (Signed)
Indication: Atrial fibrillation Duration: Lifelong INR: At target. Dr. Groce's assessment and plan were reviewed and I agree with his documentation. 

## 2013-11-08 NOTE — Progress Notes (Signed)
I have reviewed Dr. Saralyn PilarGroce's note.  [name] is on coumadin for Afib and will be on lifelong therapy.  Plan as per Dr. Saralyn PilarGroce's note.

## 2013-11-17 ENCOUNTER — Other Ambulatory Visit: Payer: Self-pay | Admitting: *Deleted

## 2013-11-17 NOTE — Telephone Encounter (Signed)
Last refill on 12/30 Pt will be in hospital on Monday 1/26 and wanted to get while he was here. Pt # [phone]

## 2013-11-18 MED ORDER — HYDROCODONE-ACETAMINOPHEN 5-325 MG PO TABS: 2.0000 | ORAL_TABLET | Freq: Three times a day (TID) | ORAL | Status: DC | PRN

## 2013-11-21 ENCOUNTER — Ambulatory Visit (INDEPENDENT_AMBULATORY_CARE_PROVIDER_SITE_OTHER): Payer: Medicare Other | Admitting: Pharmacist

## 2013-11-21 DIAGNOSIS — Z7901 Long term (current) use of anticoagulants: Secondary | ICD-10-CM

## 2013-11-21 DIAGNOSIS — I4891 Unspecified atrial fibrillation: Secondary | ICD-10-CM

## 2013-11-21 LAB — POCT INR: INR: 2.9

## 2013-11-21 NOTE — Progress Notes (Signed)
Anti-Coagulation Progress Note  Leonard Massey is a 68 y.o. male who is currently on an anti-coagulation regimen.    RECENT RESULTS: Recent results are below, the most recent result is correlated with a dose of 22.5 mg. per week: Lab Results  Component Value Date   INR 2.90 11/21/2013   INR 2.90 10/31/2013   INR 2.70 10/28/2013    ANTI-COAG DOSE: Anticoagulation Dose Instructions as of 11/21/2013     Glynis SmilesSun Mon Tue Wed Thu Fri Sat   New Dose 2.5 mg 2.5 mg 2.5 mg 2.5 mg 2.5 mg 2.5 mg 2.5 mg       ANTICOAG SUMMARY: Anticoagulation Episode Summary   Current INR goal 2.0-3.0  Next INR check 12/12/2013  INR from last check 2.90 (11/21/2013)  Weekly max dose   Target end date   [facility]  Preferred lab   Send INR reminders to    Indications  Atrial fibrillation [427.31] Long term (current) use of anticoagulants [V58.61]        Comments         ANTICOAG TODAY: Anticoagulation Summary as of 11/21/2013   INR goal 2.0-3.0  Selected INR 2.90 (11/21/2013)  Next INR check 12/12/2013  Target end date    Indications  Atrial fibrillation [427.31] Long term (current) use of anticoagulants [V58.61]      Anticoagulation Episode Summary   [facility]   Preferred lab    Send INR reminders to    Comments       PATIENT INSTRUCTIONS: Patient Instructions  Patient instructed to take medications as defined in the Anti-coagulation Track section of this encounter.  Patient instructed to take today's dose.  Patient verbalized understanding of these instructions.       FOLLOW-UP Return in 3 weeks (on 12/12/2013) for Follow up INR at 0930h.  Hulen LusterJames Jahira Swiss, III Pharm.D., CACP

## 2013-11-21 NOTE — Patient Instructions (Signed)
Patient instructed to take medications as defined in the Anti-coagulation Track section of this encounter.  Patient instructed to take today's dose.  Patient verbalized understanding of these instructions.    

## 2013-11-23 NOTE — Progress Notes (Signed)
Indication: Atrial fibrillation. Duration: Lifelong. INR: At target. Agree with Dr. Groce's assessment and plan. 

## 2013-12-12 ENCOUNTER — Ambulatory Visit (INDEPENDENT_AMBULATORY_CARE_PROVIDER_SITE_OTHER): Payer: Medicare Other | Admitting: Pharmacist

## 2013-12-12 ENCOUNTER — Other Ambulatory Visit: Payer: Self-pay | Admitting: *Deleted

## 2013-12-12 DIAGNOSIS — I4891 Unspecified atrial fibrillation: Secondary | ICD-10-CM

## 2013-12-12 DIAGNOSIS — Z7901 Long term (current) use of anticoagulants: Secondary | ICD-10-CM

## 2013-12-12 LAB — POCT INR: INR: 2

## 2013-12-12 MED ORDER — LISINOPRIL-HYDROCHLOROTHIAZIDE 20-12.5 MG PO TABS: 1.0000 | ORAL_TABLET | Freq: Every day | ORAL | Status: DC

## 2013-12-12 MED ORDER — ATENOLOL 25 MG PO TABS: 25.0000 mg | ORAL_TABLET | Freq: Every day | ORAL | Status: DC

## 2013-12-12 MED ORDER — ESOMEPRAZOLE MAGNESIUM 40 MG PO CPDR: 40.0000 mg | DELAYED_RELEASE_CAPSULE | Freq: Every day | ORAL | Status: DC

## 2013-12-12 MED ORDER — SIMVASTATIN 40 MG PO TABS: 40.0000 mg | ORAL_TABLET | Freq: Every day | ORAL | Status: DC

## 2013-12-12 NOTE — Telephone Encounter (Signed)
Refilled medications, Norco prescription has not been refilled as have already printed and signed this script previously, has patient picked up script?

## 2013-12-12 NOTE — Patient Instructions (Signed)
Patient instructed to take medications as defined in the Anti-coagulation Track section of this encounter.  Patient instructed to take today's dose.  Patient verbalized understanding of these instructions.    

## 2013-12-12 NOTE — Progress Notes (Signed)
Anti-Coagulation Progress Note  Casimer LeekJohn W Rybka is a 68 y.o. male who is currently on an anti-coagulation regimen.    RECENT RESULTS: Recent results are below, the most recent result is correlated with a dose of 17.5 mg. per week: Lab Results  Component Value Date   INR 2.00 12/12/2013   INR 2.90 11/21/2013   INR 2.90 10/31/2013    ANTI-COAG DOSE: Anticoagulation Dose Instructions as of 12/12/2013     Glynis SmilesSun Mon Tue Wed Thu Fri Sat   New Dose 2.5 mg 5 mg 2.5 mg 2.5 mg 5 mg 2.5 mg 2.5 mg       ANTICOAG SUMMARY: Anticoagulation Episode Summary   Current INR goal 2.0-3.0  Next INR check 01/09/2014  INR from last check 2.00 (12/12/2013)  Weekly max dose   Target end date   [facility]  Preferred lab   Send INR reminders to    Indications  Atrial fibrillation [427.31] Long term (current) use of anticoagulants [V58.61]        Comments         ANTICOAG TODAY: Anticoagulation Summary as of 12/12/2013   INR goal 2.0-3.0  Selected INR 2.00 (12/12/2013)  Next INR check 01/09/2014  Target end date    Indications  Atrial fibrillation [427.31] Long term (current) use of anticoagulants [V58.61]      Anticoagulation Episode Summary   [facility]   Preferred lab    Send INR reminders to    Comments       PATIENT INSTRUCTIONS: Patient Instructions  Patient instructed to take medications as defined in the Anti-coagulation Track section of this encounter.  Patient instructed to take today's dose.  Patient verbalized understanding of these instructions.       FOLLOW-UP Return in 4 weeks (on 01/09/2014) for Follow up INR at 0900.  Hulen LusterJames Madolyn Ackroyd, III Pharm.D., CACP

## 2014-01-09 ENCOUNTER — Other Ambulatory Visit: Payer: Self-pay | Admitting: Internal Medicine

## 2014-01-09 ENCOUNTER — Ambulatory Visit (INDEPENDENT_AMBULATORY_CARE_PROVIDER_SITE_OTHER): Payer: Medicare Other | Admitting: Pharmacist

## 2014-01-09 DIAGNOSIS — Z7901 Long term (current) use of anticoagulants: Secondary | ICD-10-CM

## 2014-01-09 DIAGNOSIS — I4891 Unspecified atrial fibrillation: Secondary | ICD-10-CM

## 2014-01-09 LAB — POCT INR: INR: 2.9

## 2014-01-09 NOTE — Progress Notes (Signed)
Anti-Coagulation Progress Note  Leonard LeekJohn W Massey is a 68 y.o. male who is currently on an anti-coagulation regimen.    RECENT RESULTS: Recent results are below, the most recent result is correlated with a dose of 22.5 mg. per week: Lab Results  Component Value Date   INR 2.90 01/09/2014   INR 2.00 12/12/2013   INR 2.90 11/21/2013    ANTI-COAG DOSE: Anticoagulation Dose Instructions as of 01/09/2014     Glynis SmilesSun Mon Tue Wed Thu Fri Sat   New Dose 2.5 mg 5 mg 2.5 mg 2.5 mg 2.5 mg 2.5 mg 2.5 mg       ANTICOAG SUMMARY: Anticoagulation Episode Summary   Current INR goal 2.0-3.0  Next INR check 01/30/2014  INR from last check 2.90 (01/09/2014)  Weekly max dose   Target end date   [facility]  Preferred lab   Send INR reminders to    Indications  Atrial fibrillation [427.31] Long term (current) use of anticoagulants [V58.61]        Comments         ANTICOAG TODAY: Anticoagulation Summary as of 01/09/2014   INR goal 2.0-3.0  Selected INR 2.90 (01/09/2014)  Next INR check 01/30/2014  Target end date    Indications  Atrial fibrillation [427.31] Long term (current) use of anticoagulants [V58.61]      Anticoagulation Episode Summary   [facility]   Preferred lab    Send INR reminders to    Comments       PATIENT INSTRUCTIONS: Patient Instructions  Patient instructed to take medications as defined in the Anti-coagulation Track section of this encounter.  Patient instructed to OMIT today's dose.  Patient verbalized understanding of these instructions.       FOLLOW-UP Return in about 3 weeks (around 01/30/2014) for Follow up INR at 0930h.  Hulen LusterJames Esaul Dorwart, III Pharm.D., CACP

## 2014-01-09 NOTE — Patient Instructions (Signed)
Patient instructed to take medications as defined in the Anti-coagulation Track section of this encounter.  Patient instructed to OMIT today's dose.  Patient verbalized understanding of these instructions.    

## 2014-01-09 NOTE — Progress Notes (Signed)
Indication: Atrial fibrillation. Duration: Lifelong. INR: At target. Agree with Dr. Groce's assessment and plan. 

## 2014-01-12 ENCOUNTER — Encounter: Payer: Self-pay | Admitting: Internal Medicine

## 2014-01-12 ENCOUNTER — Ambulatory Visit (INDEPENDENT_AMBULATORY_CARE_PROVIDER_SITE_OTHER): Payer: Medicare Other | Admitting: Internal Medicine

## 2014-01-12 VITALS — BP 120/80 | HR 74 | Temp 97.0°F | Wt 133.8 lb

## 2014-01-12 DIAGNOSIS — F17200 Nicotine dependence, unspecified, uncomplicated: Secondary | ICD-10-CM

## 2014-01-12 DIAGNOSIS — G458 Other transient cerebral ischemic attacks and related syndromes: Secondary | ICD-10-CM

## 2014-01-12 DIAGNOSIS — R202 Paresthesia of skin: Secondary | ICD-10-CM

## 2014-01-12 DIAGNOSIS — J449 Chronic obstructive pulmonary disease, unspecified: Secondary | ICD-10-CM

## 2014-01-12 DIAGNOSIS — K219 Gastro-esophageal reflux disease without esophagitis: Secondary | ICD-10-CM

## 2014-01-12 DIAGNOSIS — I10 Essential (primary) hypertension: Secondary | ICD-10-CM

## 2014-01-12 DIAGNOSIS — Z7901 Long term (current) use of anticoagulants: Secondary | ICD-10-CM

## 2014-01-12 DIAGNOSIS — R200 Anesthesia of skin: Secondary | ICD-10-CM

## 2014-01-12 DIAGNOSIS — I4891 Unspecified atrial fibrillation: Secondary | ICD-10-CM

## 2014-01-12 DIAGNOSIS — R209 Unspecified disturbances of skin sensation: Secondary | ICD-10-CM

## 2014-01-12 DIAGNOSIS — Z0000 Encounter for general adult medical examination without abnormal findings: Secondary | ICD-10-CM | POA: Insufficient documentation

## 2014-01-12 DIAGNOSIS — G894 Chronic pain syndrome: Secondary | ICD-10-CM

## 2014-01-12 DIAGNOSIS — J441 Chronic obstructive pulmonary disease with (acute) exacerbation: Secondary | ICD-10-CM

## 2014-01-12 MED ORDER — HYDROCODONE-ACETAMINOPHEN 5-325 MG PO TABS: 2.0000 | ORAL_TABLET | Freq: Three times a day (TID) | ORAL | Status: DC | PRN

## 2014-01-12 NOTE — Progress Notes (Signed)
[facility] Subjective:   Patient ID: Leonard Massey male   DOB: 1946/09/22 68 y.o.   MRN: [id number]  HPI: [name] is a 68 y.o. male with a PMH significant for HTN, HLD, COPD, Depression, Subclavian steal syndrome, Alcoholism, and Tobacco use.  Since I have seen him last he has been admitted and treated for CAP and found to have Afib, he has been started on long term anticoagulation and follows in our coumadin clinic. Additionally an echo done at [facility] revealed an EF of 45-50% and mild global hypokinesis, his left atrium was mildly dilated. A CTA was obtained at [facility] of his carotid arteries which also showed significant bilateral stenosis. He has been keeping regular appointments with Dr. Cory Massey.  He reports no current symptoms of chest pain, pressure, palpitation, he does admit DOE of about 5-6 stairs. He reports he continues to do will with Leonard Massey and Albuterol as needed since his hospitalization.  He continues to follow with Dr. Blenda Massey.  One of the biggest changes he has made since I saw him last is that he has been able to quit smoking cigarettes. He has been using e-cigarettes instead.  His chronic back pain and neck pain have been mostly tolerable with his Norco.  However he has been in increased pain from his neck lately. He reports he has brought this up to Dr. Cory Massey who has made him an appointment with a Neurosurgeon for tomorrow morning.  He does have a history of pin placement in his C-spine.  Cardiologist: Dr. Cory Massey, [place] Pulmonologist: Dr. Rene Massey Pulmon[facility]  Past Medical History  Diagnosis Date  . COPD (chronic obstructive pulmonary disease)   . Depression   . GERD (gastroesophageal reflux disease)     Pt reports having EGD/colonoscopy in 2010 by Dr. Chales Massey in [place] w[place] showed ulcers on uppper and normal colon. No report in emr.   . Hyperlipidemia     Pt typically has low HDL and low LDL.   [name] Hypertension   . Low  back pain   . Cervical spondylosis   . Alcoholism   . Subclavian steal syndrome     HX of.   . Right knee pain 8/10    Began to complain of after requesting a scooter.  Exam was negative and it was decided that pt should continue to ambulate.    Current Outpatient Prescriptions  Medication Sig Dispense Refill  . albuterol (PROAIR HFA) 108 (90 BASE) MCG/ACT inhaler Inhale 1-2 puffs into the lungs every 6 (six) hours as needed for wheezing or shortness of breath.      [name] aspirin 81 MG EC tablet Take 81 mg by mouth daily.        [name] EPINEPHrine (EPIPEN 2-PAK) 0.3 mg/0.3 mL SOAJ injection as needed.      [name] esomeprazole (NEXIUM) 40 MG capsule Take 1 capsule (40 mg total) by mouth daily before breakfast.  90 capsule  4  . esomeprazole (NEXIUM) 40 MG capsule Take 40 mg by mouth.      . fluticasone (FLONASE) 50 MCG/ACT nasal spray Place into the nose.      [name] Fluticasone Furoate-Vilanterol (BREO ELLIPTA) 100-25 MCG/INH AEPB Inhale 1 Inhaler into the lungs daily.      [name] HYDROcodone-acetaminophen (NORCO/VICODIN) 5-325 MG per tablet Take 2 tablets by mouth every 8 (eight) hours as needed.  180 tablet  0  . metoprolol tartrate (LOPRESSOR) 25 MG tablet Take 25 mg by mouth.      [name]  sertraline (ZOLOFT) 50 MG tablet TAKE 1 TABLET BY MOUTH EVERY DAY  30 tablet  0  . simvastatin (ZOCOR) 40 MG tablet Take 1 tablet (40 mg total) by mouth at bedtime.  90 tablet  4  . warfarin (COUMADIN) 5 MG tablet Take 1 tablet (5mg ) by mouth every evening      . zoster vaccine live, PF, (ZOSTAVAX) 16109 UNT/0.65ML injection Inject 19,400 Units into the skin once.  1 each  0   No current facility-administered medications for this visit.   No family history on file. History   Social History  . Marital Status: Divorced    Spouse Name: [name]    Number of Children: [name]  . Years of Education: [name]   Social History Main Topics  . Smoking status: Former Smoker -- 0.30 packs/day    Types: Cigarettes    Start date: 07/15/2013  .  Smokeless tobacco: Former Neurosurgeon    Quit date: 02/01/2012     Comment: PATIENT STARTED BACK SMOKING  . Alcohol Use: None  . Drug Use: None  . Sexual Activity: None   Other Topics Concern  . None   Social History Narrative   Disabled several years, On SSD[place] and [place].   Lives with disabled girlfriend in a trailer.  Works around [employer] and keeps busy.    Review of Systems: Review of Systems  Constitutional: Positive for weight loss (during hospitalizations but has gained the weight back). Negative for fever and chills.  Eyes: Negative for blurred vision.  Respiratory: Positive for shortness of breath (with exertion). Negative for cough, sputum production and wheezing.   Cardiovascular: Negative for chest pain, palpitations, claudication and leg swelling.  Gastrointestinal: Negative for nausea, vomiting and abdominal pain.  Genitourinary: Negative for dysuria.  Musculoskeletal: Positive for back pain and neck pain. Negative for falls.  Neurological: Positive for tingling (occasional in right hand). Negative for dizziness, sensory change, focal weakness, seizures, loss of consciousness and headaches.  Psychiatric/Behavioral: Negative for substance abuse.     Objective:  Physical Exam: Filed Vitals:   01/12/14 1324  BP: 120/80  Pulse: 74  Temp: 97 F (36.1 C)  TempSrc: Oral  Weight: 133 lb 12.8 oz (60.691 kg)  SpO2: 98%  Physical Exam  Nursing note and vitals reviewed. Constitutional: He appears well-developed and well-nourished.  HENT:  Head: Normocephalic and atraumatic.  Neck:  Limited ROM, ~80degrees of rotation to left, ~60 degrees of rotation to right- limited by pain. No carotid bruit appreciated bilaterally  Cardiovascular: Normal rate, regular rhythm, normal heart sounds and intact distal pulses.   No murmur heard. Pulmonary/Chest: No respiratory distress. He has no wheezes.  Prolonged expiratory phase  Musculoskeletal:  5/5 grip strength bilaterally, +2  radial and ulnar pulses bilaterally.  Skin: Skin is warm and dry.  Psychiatric: He has a normal mood and affect. His behavior is normal. Judgment normal.    Assessment & Plan:  Case discussed with Dr. Rogelia Boga See Problem Based Assessment and Plan Medications Ordered Meds ordered this encounter  Medications  . metoprolol tartrate (LOPRESSOR) 25 MG tablet    Sig: Take 25 mg by mouth.  . warfarin (COUMADIN) 5 MG tablet    Sig: Take 1 tablet (5mg ) by mouth every evening  . fluticasone (FLONASE) 50 MCG/ACT nasal spray    Sig: Place into the nose.  [name] EPINEPHrine (EPIPEN 2-PAK) 0.3 mg/0.3 mL SOAJ injection    Sig: as needed.  [name] esomeprazole (NEXIUM) 40 MG capsule    Sig:  Take 40 mg by mouth.  . Fluticasone Furoate-Vilanterol (BREO ELLIPTA) 100-25 MCG/INH AEPB    Sig: Inhale 1 Inhaler into the lungs daily.  [name]. albuterol (PROAIR HFA) 108 (90 BASE) MCG/ACT inhaler    Sig: Inhale 1-2 puffs into the lungs every 6 (six) hours as needed for wheezing or shortness of breath.   Other Orders No orders of the defined types were placed in this encounter.

## 2014-01-12 NOTE — Assessment & Plan Note (Signed)
Stable.  Continue Nexium. 

## 2014-01-12 NOTE — Patient Instructions (Signed)
Please keep up the good work avoiding Cigarettes Continue to take your medication as prescribed Keep your Neurosurgery appointment tomorrow. Ask your pharmacy again about the SHINGLES vaccine.  Please bring your medicines with you each time you come.   Medicines may be  Eye drops  Herbal   Vitamins  Pills  Seeing these help us take care of you.

## 2014-01-12 NOTE — Assessment & Plan Note (Signed)
-  Reports Pneumovax given at pulmonology office - Encourage Zostavax.

## 2014-01-12 NOTE — Assessment & Plan Note (Signed)
BP Readings from Last 3 Encounters:  01/12/14 120/80  06/02/13 143/81  10/07/12 119/71    Lab Results  Component Value Date   NA 138 06/02/2013   K 4.6 06/02/2013   CREATININE 0.77 06/02/2013    Assessment: Blood pressure control: controlled Progress toward BP goal:  at goal Comments: multiple changes to medications  Plan: Medications:  Metoprolol 25mg  BID Educational resources provided: brochure Self management tools provided: home blood pressure logbook Other plans: Of note Atenolol was changed to metoprolol for rate control.  Lisinopril HCTZ was later discontinued as patient was have presyncope.  Patient has stopped smoking and now is controlled with just metoprolol.

## 2014-01-12 NOTE — Assessment & Plan Note (Signed)
-   Currently regular rate, rhythm  -Rate control with Metoprolol 25mg  BID - A/C with warfarin goal INR 2-3.

## 2014-01-12 NOTE — Assessment & Plan Note (Signed)
Patient commended for stopping smoking, advised e-cigarettes are better than continuing to smoke but best thing would be to stop completely. - Continue Breo Ellipta - Continue Albuterol PRN - Will try to ensure that patient has received Penumovax at pulmonology office.

## 2014-01-12 NOTE — Assessment & Plan Note (Signed)
Chronic neck, back and knee pain tolerable with Norco 5/325 reports average use of 6 a day which correlates with his #180 prescription. No red flag behavior.  Picked up script for next month.  I printed an additional 2 months of prescriptions for April and May and have given him these scripts as he lives far from the clinic.

## 2014-01-12 NOTE — Assessment & Plan Note (Signed)
  Assessment: Progress toward smoking cessation:   Stopped smoking Barriers to progress toward smoking cessation:    Comments:  Plan: Instruction/counseling given:  I commended patient for quitting and reviewed strategies for preventing relapses. Educational resources provided:    Self management tools provided:    Medications to assist with smoking cessation:  None Patient agreed to the following self-care plans for smoking cessation:    Other plans: Encourage to eventually stop e-cig use.

## 2014-01-12 NOTE — Assessment & Plan Note (Signed)
Given patient's history of previous cervical spinal fusions, and evidence of bulging discs, there is high suspicion that the patient's right hand numbness is secondary to patient's cervical spinal disease..  - He previously deferred referral to Neurosurgery but given increased neck pain he has been referred by Cardiology already.

## 2014-01-13 ENCOUNTER — Encounter: Payer: Self-pay | Admitting: Internal Medicine

## 2014-01-13 NOTE — Progress Notes (Signed)
Case discussed with Dr. Hoffman at the time of the visit.  We reviewed the resident's history and exam and pertinent patient test results.  I agree with the assessment, diagnosis, and plan of care documented in the resident's note. 

## 2014-01-30 ENCOUNTER — Ambulatory Visit (INDEPENDENT_AMBULATORY_CARE_PROVIDER_SITE_OTHER): Payer: Medicare Other | Admitting: Pharmacist

## 2014-01-30 DIAGNOSIS — I480 Paroxysmal atrial fibrillation: Secondary | ICD-10-CM

## 2014-01-30 DIAGNOSIS — I4891 Unspecified atrial fibrillation: Secondary | ICD-10-CM

## 2014-01-30 DIAGNOSIS — Z7901 Long term (current) use of anticoagulants: Secondary | ICD-10-CM

## 2014-01-30 LAB — POCT INR: INR: 2.2

## 2014-01-30 NOTE — Patient Instructions (Signed)
Patient instructed to take medications as defined in the Anti-coagulation Track section of this encounter.  Patient instructed to take today's dose.  Patient verbalized understanding of these instructions.    

## 2014-01-30 NOTE — Progress Notes (Signed)
Anti-Coagulation Progress Note  Casimer LeekJohn W Vineyard is a 68 y.o. male who is currently on an anti-coagulation regimen.    RECENT RESULTS: Recent results are below, the most recent result is correlated with a dose of 20 mg. per week: Lab Results  Component Value Date   INR 2.20 01/30/2014   INR 2.90 01/09/2014   INR 2.00 12/12/2013    ANTI-COAG DOSE: Anticoagulation Dose Instructions as of 01/30/2014     Glynis SmilesSun Mon Tue Wed Thu Fri Sat   New Dose 2.5 mg 5 mg 2.5 mg 2.5 mg 5 mg 2.5 mg 2.5 mg       ANTICOAG SUMMARY: Anticoagulation Episode Summary   Current INR goal 2.0-3.0  Next INR check 03/13/2014  INR from last check 2.20 (01/30/2014)  Weekly max dose   Target end date   [facility]  Preferred lab   Send INR reminders to    Indications  Paroxysmal a-fib [427.31] Long term (current) use of anticoagulants [V58.61]        Comments         ANTICOAG TODAY: Anticoagulation Summary as of 01/30/2014   INR goal 2.0-3.0  Selected INR 2.20 (01/30/2014)  Next INR check 03/13/2014  Target end date    Indications  Paroxysmal a-fib [427.31] Long term (current) use of anticoagulants [V58.61]      Anticoagulation Episode Summary   [facility]   Preferred lab    Send INR reminders to    Comments       PATIENT INSTRUCTIONS: Patient Instructions  Patient instructed to take medications as defined in the Anti-coagulation Track section of this encounter.  Patient instructed to take today's dose.  Patient verbalized understanding of these instructions.       FOLLOW-UP Return in about 6 weeks (around 03/13/2014) for Follow up INR at 0930h.  Hulen LusterJames Kaileen Bronkema, III Pharm.D., CACP

## 2014-02-06 ENCOUNTER — Other Ambulatory Visit: Payer: Self-pay | Admitting: Internal Medicine

## 2014-03-06 ENCOUNTER — Ambulatory Visit (INDEPENDENT_AMBULATORY_CARE_PROVIDER_SITE_OTHER): Payer: Medicare Other | Admitting: Pharmacist

## 2014-03-06 DIAGNOSIS — I480 Paroxysmal atrial fibrillation: Secondary | ICD-10-CM

## 2014-03-06 DIAGNOSIS — Z7901 Long term (current) use of anticoagulants: Secondary | ICD-10-CM

## 2014-03-06 DIAGNOSIS — I4891 Unspecified atrial fibrillation: Secondary | ICD-10-CM

## 2014-03-06 LAB — POCT INR: INR: 3.3

## 2014-03-06 NOTE — Progress Notes (Signed)
Anti-Coagulation Progress Note  Leonard LeekJohn W Massey is a 68 y.o. male who is currently on an anti-coagulation regimen.    RECENT RESULTS: Recent results are below, the most recent result is correlated with a dose of 22.5 mg. per week: Lab Results  Component Value Date   INR 3.30 03/06/2014   INR 2.20 01/30/2014   INR 2.90 01/09/2014    ANTI-COAG DOSE: Anticoagulation Dose Instructions as of 03/06/2014     Glynis SmilesSun Mon Tue Wed Thu Fri Sat   New Dose 2.5 mg 2.5 mg 2.5 mg 5 mg 2.5 mg 2.5 mg 2.5 mg       ANTICOAG SUMMARY: Anticoagulation Episode Summary   Current INR goal 2.0-3.0  Next INR check 04/10/2014  INR from last check 3.30! (03/06/2014)  Weekly max dose   Target end date   [facility]  Preferred lab   Send INR reminders to    Indications  Paroxysmal a-fib [427.31] Long term (current) use of anticoagulants [V58.61]        Comments         ANTICOAG TODAY: Anticoagulation Summary as of 03/06/2014   INR goal 2.0-3.0  Selected INR 3.30! (03/06/2014)  Next INR check 04/10/2014  Target end date    Indications  Paroxysmal a-fib [427.31] Long term (current) use of anticoagulants [V58.61]      Anticoagulation Episode Summary   [facility]   Preferred lab    Send INR reminders to    Comments       PATIENT INSTRUCTIONS: Patient Instructions  Patient instructed to take medications as defined in the Anti-coagulation Track section of this encounter.  Patient instructed to take today's dose.  Patient verbalized understanding of these instructions.       FOLLOW-UP Return in 5 weeks (on 04/10/2014) for Follow up INR at 0930h.  Hulen LusterJames Groce, III Pharm.D., CACP

## 2014-03-06 NOTE — Patient Instructions (Signed)
Patient instructed to take medications as defined in the Anti-coagulation Track section of this encounter.  Patient instructed to take today's dose.  Patient verbalized understanding of these instructions.    

## 2014-03-13 ENCOUNTER — Ambulatory Visit: Payer: Medicare Other

## 2014-04-10 ENCOUNTER — Ambulatory Visit (INDEPENDENT_AMBULATORY_CARE_PROVIDER_SITE_OTHER): Payer: Medicare Other | Admitting: Pharmacist

## 2014-04-10 ENCOUNTER — Other Ambulatory Visit: Payer: Self-pay | Admitting: *Deleted

## 2014-04-10 DIAGNOSIS — I480 Paroxysmal atrial fibrillation: Secondary | ICD-10-CM

## 2014-04-10 DIAGNOSIS — I4891 Unspecified atrial fibrillation: Secondary | ICD-10-CM

## 2014-04-10 DIAGNOSIS — Z7901 Long term (current) use of anticoagulants: Secondary | ICD-10-CM

## 2014-04-10 LAB — POCT INR: INR: 2.2

## 2014-04-10 MED ORDER — HYDROCODONE-ACETAMINOPHEN 5-325 MG PO TABS: 2.0000 | ORAL_TABLET | Freq: Three times a day (TID) | ORAL | Status: DC | PRN

## 2014-04-10 NOTE — Telephone Encounter (Signed)
Last filled 03/26/14 [facility]

## 2014-04-10 NOTE — Telephone Encounter (Signed)
I printed, signed, and provided to refill nurse a prescription for hydrocodone-acetaminophen 5-325 mg, take 2 tablets by mouth every 8 (eight) hours as needed (for pain), #180, to be filled no earlier than 04/25/2014.

## 2014-04-10 NOTE — Progress Notes (Signed)
Anti-Coagulation Progress Note  Leonard Massey is a 68 y.o. male who is currently on an anti-coagulation regimen.    RECENT RESULTS: Recent results are below, the most recent result is correlated with a dose of 20 mg. per week: Lab Results  Component Value Date   INR 2.20 04/10/2014   INR 3.30 03/06/2014   INR 2.20 01/30/2014    ANTI-COAG DOSE: Anticoagulation Dose Instructions as of 04/10/2014     Glynis SmilesSun Mon Tue Wed Thu Fri Sat   New Dose 2.5 mg 5 mg 2.5 mg 2.5 mg 5 mg 2.5 mg 2.5 mg       ANTICOAG SUMMARY: Anticoagulation Episode Summary   Current INR goal 2.0-3.0  Next INR check 05/08/2014  INR from last check 2.20 (04/10/2014)  Weekly max dose   Target end date   [facility]  Preferred lab   Send INR reminders to    Indications  Paroxysmal a-fib [427.31] Long term (current) use of anticoagulants [V58.61]        Comments         ANTICOAG TODAY: Anticoagulation Summary as of 04/10/2014   INR goal 2.0-3.0  Selected INR 2.20 (04/10/2014)  Next INR check 05/08/2014  Target end date    Indications  Paroxysmal a-fib [427.31] Long term (current) use of anticoagulants [V58.61]      Anticoagulation Episode Summary   [facility]   Preferred lab    Send INR reminders to    Comments       PATIENT INSTRUCTIONS: Patient Instructions  Patient instructed to take medications as defined in the Anti-coagulation Track section of this encounter.  Patient instructed to take today's dose.  Patient verbalized understanding of these instructions.       FOLLOW-UP Return in 4 weeks (on 05/08/2014) for Follow up INR at 0930h.  Leonard Massey, III Pharm.D., CACP

## 2014-04-10 NOTE — Telephone Encounter (Signed)
He's here to see Dr Alexandria LodgeGroce and he lives in [place]. FYI states no violations.

## 2014-04-10 NOTE — Patient Instructions (Signed)
Patient instructed to take medications as defined in the Anti-coagulation Track section of this encounter.  Patient instructed to take today's dose.  Patient verbalized understanding of these instructions.    

## 2014-04-10 NOTE — Telephone Encounter (Signed)
Rx given to pt. 

## 2014-05-08 ENCOUNTER — Ambulatory Visit (INDEPENDENT_AMBULATORY_CARE_PROVIDER_SITE_OTHER): Payer: Medicare Other | Admitting: Pharmacist

## 2014-05-08 DIAGNOSIS — I4891 Unspecified atrial fibrillation: Secondary | ICD-10-CM

## 2014-05-08 DIAGNOSIS — Z7901 Long term (current) use of anticoagulants: Secondary | ICD-10-CM

## 2014-05-08 DIAGNOSIS — I480 Paroxysmal atrial fibrillation: Secondary | ICD-10-CM

## 2014-05-08 LAB — POCT INR: INR: 3.7

## 2014-05-08 NOTE — Patient Instructions (Signed)
Patient instructed to take medications as defined in the Anti-coagulation Track section of this encounter.  Patient instructed to OMIT/HOLD today's dose.  Patient verbalized understanding of these instructions.    

## 2014-05-08 NOTE — Progress Notes (Signed)
Anti-Coagulation Progress Note  Leonard Massey is a 68 y.o. male who is currently on an anti-coagulation regimen.    RECENT RESULTS: Recent results are below, the most recent result is correlated with a dose of 22.5 mg. per week: Will OMIT/HOLD today's dose and decrease by 10%/wk from 22.5mg /wk to 20mg /wk.  Lab Results  Component Value Date   INR 3.70 05/08/2014   INR 2.20 04/10/2014   INR 3.30 03/06/2014    ANTI-COAG DOSE: Anticoagulation Dose Instructions as of 05/08/2014     Glynis SmilesSun Mon Tue Wed Thu Fri Sat   New Dose 2.5 mg 5 mg 2.5 mg 2.5 mg 2.5 mg 2.5 mg 2.5 mg       ANTICOAG SUMMARY: Anticoagulation Episode Summary   Current INR goal 2.0-3.0  Next INR check 06/05/2014  INR from last check 3.70! (05/08/2014)  Weekly max dose   Target end date   [facility]  Preferred lab   Send INR reminders to    Indications  Paroxysmal a-fib [427.31] Long term (current) use of anticoagulants [V58.61]        Comments         ANTICOAG TODAY: Anticoagulation Summary as of 05/08/2014   INR goal 2.0-3.0  Selected INR 3.70! (05/08/2014)  Next INR check 06/05/2014  Target end date    Indications  Paroxysmal a-fib [427.31] Long term (current) use of anticoagulants [V58.61]      Anticoagulation Episode Summary   [facility]   Preferred lab    Send INR reminders to    Comments       PATIENT INSTRUCTIONS: Patient Instructions  Patient instructed to take medications as defined in the Anti-coagulation Track section of this encounter.  Patient instructed to OMIT/HOLD today's dose.  Patient verbalized understanding of these instructions.       FOLLOW-UP Return in 4 weeks (on 06/05/2014) for Follow up INR at 0930h.  Hulen LusterJames Harley Mccartney, III Pharm.D., CACP

## 2014-06-01 ENCOUNTER — Ambulatory Visit (INDEPENDENT_AMBULATORY_CARE_PROVIDER_SITE_OTHER): Payer: Medicare Other | Admitting: Internal Medicine

## 2014-06-01 ENCOUNTER — Encounter: Payer: Self-pay | Admitting: Internal Medicine

## 2014-06-01 ENCOUNTER — Encounter: Payer: Medicare Other | Admitting: Internal Medicine

## 2014-06-01 VITALS — BP 131/80 | HR 70 | Temp 97.7°F | Ht 66.0 in | Wt 120.0 lb

## 2014-06-01 DIAGNOSIS — I779 Disorder of arteries and arterioles, unspecified: Secondary | ICD-10-CM

## 2014-06-01 DIAGNOSIS — I10 Essential (primary) hypertension: Secondary | ICD-10-CM

## 2014-06-01 DIAGNOSIS — I739 Peripheral vascular disease, unspecified: Secondary | ICD-10-CM

## 2014-06-01 DIAGNOSIS — R05 Cough: Secondary | ICD-10-CM | POA: Insufficient documentation

## 2014-06-01 DIAGNOSIS — Z0000 Encounter for general adult medical examination without abnormal findings: Secondary | ICD-10-CM

## 2014-06-01 DIAGNOSIS — J449 Chronic obstructive pulmonary disease, unspecified: Secondary | ICD-10-CM

## 2014-06-01 DIAGNOSIS — G894 Chronic pain syndrome: Secondary | ICD-10-CM

## 2014-06-01 DIAGNOSIS — I4891 Unspecified atrial fibrillation: Secondary | ICD-10-CM

## 2014-06-01 DIAGNOSIS — I480 Paroxysmal atrial fibrillation: Secondary | ICD-10-CM

## 2014-06-01 DIAGNOSIS — K219 Gastro-esophageal reflux disease without esophagitis: Secondary | ICD-10-CM

## 2014-06-01 DIAGNOSIS — R059 Cough, unspecified: Secondary | ICD-10-CM

## 2014-06-01 DIAGNOSIS — F17200 Nicotine dependence, unspecified, uncomplicated: Secondary | ICD-10-CM

## 2014-06-01 DIAGNOSIS — R634 Abnormal weight loss: Secondary | ICD-10-CM

## 2014-06-01 MED ORDER — HYDROCODONE-ACETAMINOPHEN 5-325 MG PO TABS: 2.0000 | ORAL_TABLET | Freq: Three times a day (TID) | ORAL | Status: DC | PRN

## 2014-06-01 MED ORDER — BENZONATATE 200 MG PO CAPS: 200.0000 mg | ORAL_CAPSULE | Freq: Three times a day (TID) | ORAL | Status: DC | PRN

## 2014-06-01 NOTE — Progress Notes (Signed)
[facility] Subjective:   Patient ID: Leonard Massey male   DOB: May 10, 1946 68 y.o.   MRN: [id number]  HPI: Leonard Massey is a 68 y.o. male with a PMH significant for HTN, Paroxsymal A fib, HLD, COPD, Depression, Subclavian steal syndrome, Alcoholism, Tobacco use, and chronic neck and back pain.    He has been keeping regular appointments with Dr. Adela [facility].  He reports no current symptoms of chest pain, pressure, palpitation, he does admit DOE from the parking lot to the clinic, he notes it is worse due to the hot weather.  He reports that [name] has referred him to an EPS specialists around the 17th or 18th of this month.  For his carotid artery stenosis he reports he plans to see a different specialists in the [facility] system within the next 2 weeks.  He reports no changes in vision, near syncope or neurologic deficits.   He continues to follow with [name]. For his COPD.  He is using Breo and Albuterol.  The last time I saw Leonard Massey he had changed from smoking cigarettes to e-cigs. He reports he has continued with this and is doing well   His chronic back pain and neck pain have been mostly tolerable with his Norco.  He does have a history of pin placement in his C-spine.  He reported at his last visit that he had already been referred to Neurosurgery.  He reported he saw his neurosurgeon who reported there was no evidence that there was nerve impingement and that surgery was not indicated.  Per our records he last received a colonoscopy in 2005 and was due for repeat in 2010.  He does not think he has had one since that time (although we have a note below in the EMR that he has previously reported a normal EGD/colonoscopy in 2010 by Dr [name]).  He reports he has been following with Dr. [name]  (gasteroentrology) in [place] and he has wanted to wait on repeating this.  He does note a nocturnal cough occasionally and is unsure what to take for it as he is  taking so many drugs and does not want to cause any interactions.  Cardiologist: [name], [place] Pulmonologist: [name] [facility]  Gastroenterologist: [name]  Past Medical History  Diagnosis Date  . COPD (chronic obstructive pulmonary disease)   . Depression   . GERD (gastroesophageal reflux disease)     Pt reports having EGD/colonoscopy in 2010 by Dr. [name] in [place] wich showed ulcers on uppper and normal colon. No report in emr.   . Hyperlipidemia     Pt typically has low HDL and low LDL.   [name] Hypertension   . Low back pain   . Cervical spondylosis   . Alcoholism   . Subclavian steal syndrome     HX of.   . Right knee pain 8/10    Began to complain of after requesting a scooter.  Exam was negative and it was decided that pt should continue to ambulate.    Current Outpatient Prescriptions  Medication Sig Dispense Refill  . albuterol (PROAIR HFA) 108 (90 BASE) MCG/ACT inhaler Inhale 1-2 puffs into the lungs every 6 (six) hours as needed for wheezing or shortness of breath.      [name] aspirin 81 MG EC tablet Take 81 mg by mouth daily.        . carvedilol (COREG) 6.25 MG tablet Take 6.25 mg by  mouth.      . esomeprazole (NEXIUM) 40 MG capsule Take 1 capsule (40 mg total) by mouth daily before breakfast.  90 capsule  4  . fluticasone (FLONASE) 50 MCG/ACT nasal spray Place into the nose.      [name]. Fluticasone Furoate-Vilanterol (BREO ELLIPTA) 100-25 MCG/INH AEPB Inhale 1 Inhaler into the lungs daily.      [name]. HYDROcodone-acetaminophen (NORCO/VICODIN) 5-325 MG per tablet Take 2 tablets by mouth every 8 (eight) hours as needed (for pain).  180 tablet  0  . sertraline (ZOLOFT) 50 MG tablet TAKE 1 TABLET BY MOUTH EVERY DAY  30 tablet  5  . simvastatin (ZOCOR) 40 MG tablet Take 1 tablet (40 mg total) by mouth at bedtime.  90 tablet  4  . warfarin (COUMADIN) 5 MG tablet Take 1 tablet (5mg ) by mouth every evening      . benzonatate (TESSALON) 200 MG capsule Take 1  capsule (200 mg total) by mouth 3 (three) times daily as needed for cough.  60 capsule  3  . EPINEPHrine (EPIPEN 2-PAK) 0.3 mg/0.3 mL SOAJ injection as needed.       No current facility-administered medications for this visit.   No family history on file. History   Social History  . Marital Status: Divorced    Spouse Name: [name]    Number of Children: [name]  . Years of Education: [name]   Social History Main Topics  . Smoking status: Former Smoker -- 0.00 packs/day    Types: Cigarettes    Start date: 07/15/2013    Quit date: 08/14/2013  . Smokeless tobacco: Former NeurosurgeonUser    Quit date: 02/01/2012     Comment: Using E-cigs  . Alcohol Use: Not on file  . Drug Use: Not on file  . Sexual Activity: Not on file   Other Topics Concern  . Not on file   Social History Narrative   Disabled several years, [place].   Lives with disabled girlfriend in a trailer.  Works around [employer] and keeps busy.    Review of Systems: Review of Systems  Constitutional: Positive for weight loss (decreased appetite). Negative for fever and chills.  Eyes: Negative for blurred vision.  Respiratory: Positive for cough (nocturnal) and shortness of breath (with exertion). Negative for sputum production and wheezing.   Cardiovascular: Negative for chest pain, palpitations, claudication and leg swelling.  Gastrointestinal: Negative for nausea, vomiting and abdominal pain.  Genitourinary: Negative for dysuria.  Musculoskeletal: Positive for back pain and neck pain. Negative for falls.  Neurological: Negative for dizziness, tingling (occasional in right hand), sensory change, focal weakness, seizures, loss of consciousness and headaches.  Psychiatric/Behavioral: Negative for substance abuse.     Objective:  Physical Exam: Filed Vitals:   06/01/14 1339  BP: 131/80  Pulse: 70  Temp: 97.7 F (36.5 C)  TempSrc: Oral  Height: 5\' 6"  (1.676 m)  Weight: 120 lb (54.432 kg)  SpO2: 95%  Physical Exam   Nursing note and vitals reviewed. Constitutional: He appears well-developed and well-nourished.  HENT:  Head: Normocephalic and atraumatic.  Neck:  Limited ROM, ~80degrees of rotation to right and left  Cardiovascular: Normal rate, regular rhythm, normal heart sounds and intact distal pulses.   No murmur heard. Pulmonary/Chest: No respiratory distress. He has no wheezes.  Mildly Prolonged expiratory phase  Musculoskeletal:  5/5 UE and grip strength bilaterally  Skin: Skin is warm and dry.  Psychiatric: He has a normal mood and affect. His behavior  is normal. Judgment normal.    Assessment & Plan:  Case discussed with Dr. Heide Spark See Problem Based Assessment and Plan Medications Ordered Meds ordered this encounter  Medications  . carvedilol (COREG) 6.25 MG tablet    Sig: Take 6.25 mg by mouth.  . benzonatate (TESSALON) 200 MG capsule    Sig: Take 1 capsule (200 mg total) by mouth 3 (three) times daily as needed for cough.    Dispense:  60 capsule    Refill:  3  . DISCONTD: HYDROcodone-acetaminophen (NORCO/VICODIN) 5-325 MG per tablet    Sig: Take 2 tablets by mouth every 8 (eight) hours as needed (for pain).    Dispense:  180 tablet    Refill:  0    Rx 1/3  . DISCONTD: HYDROcodone-acetaminophen (NORCO/VICODIN) 5-325 MG per tablet    Sig: Take 2 tablets by mouth every 8 (eight) hours as needed (for pain).    Dispense:  180 tablet    Refill:  0    Rx 2/3 to be filled 30 days after printed date  . HYDROcodone-acetaminophen (NORCO/VICODIN) 5-325 MG per tablet    Sig: Take 2 tablets by mouth every 8 (eight) hours as needed (for pain).    Dispense:  180 tablet    Refill:  0    Rx 3/3 to be filled 60 days after printed date   Other Orders No orders of the defined types were placed in this encounter.

## 2014-06-01 NOTE — Assessment & Plan Note (Signed)
Followed by Dr. Cory RoughenKirkman, [facility] everywhere shows he as referred to Dr. Sharlyn Bolognaytlewski for EP evaluation, who then referred to Dr. Newt LukesGandhi for Interventional evaluation.  It was determed that he did not need a pacemaker.  However he was also referred to Dr. Chales AbrahamsGupta (GI) for reevaluation of his Hx of PUD before any intervention would be considered.  Per last note available from Dr. Sullivan LoneGilbert plan is for a cardiac MR and referral to Dr. Everardo BealsGeary with Vascular Surgery for evaluation of his PVD. He is currently NSR, on Coreg.  He is taking warfarin per instructions from Dr. Alexandria LodgeGroce.

## 2014-06-01 NOTE — Assessment & Plan Note (Signed)
BP Readings from Last 3 Encounters:  06/01/14 131/80  01/12/14 120/80  06/02/13 143/81    Lab Results  Component Value Date   NA 138 06/02/2013   K 4.6 06/02/2013   CREATININE 0.77 06/02/2013    Assessment: Blood pressure control: controlled Progress toward BP goal:  at goal Comments:  Plan: Medications:  Coreg 6.25mg  BID Educational resources provided:   Self management tools provided:   Other plans: none

## 2014-06-01 NOTE — Assessment & Plan Note (Signed)
Patient likely needs repeat colonoscopy, will request notes from GI.

## 2014-06-01 NOTE — Assessment & Plan Note (Signed)
Mild nocturnal cough.  Will try [employer] Petroleum[employer]alon Perles, patient may also obtain Mucinex OTC.

## 2014-06-01 NOTE — Assessment & Plan Note (Addendum)
Well controlled with Nexium.  Will request notes from Dr. Chales AbrahamsGupta.

## 2014-06-01 NOTE — Assessment & Plan Note (Signed)
Patient reports decreased appetite as of late given multiple medical problems.   Wt Readings from Last 5 Encounters:  06/01/14 120 lb (54.432 kg)  01/12/14 133 lb 12.8 oz (60.691 kg)  06/02/13 135 lb 11.2 oz (61.553 kg)  10/07/12 137 lb 4.8 oz (62.279 kg)  03/19/12 148 lb 11.2 oz (67.45 kg)   Will need to monitor this and obtain GI notes.

## 2014-06-01 NOTE — Assessment & Plan Note (Signed)
  Assessment: Progress toward smoking cessation:    Barriers to progress toward smoking cessation:    Comments: has stopped smoking  Plan: Instruction/counseling given:  I commended patient for quitting and reviewed strategies for preventing relapses. Educational resources provided:    Self management tools provided:    Medications to assist with smoking cessation:  None Patient agreed to the following self-care plans for smoking cessation:    Other plans: eventually quit e-cigs

## 2014-06-01 NOTE — Assessment & Plan Note (Signed)
Patient has done will with Norco with no red flag behavior, he uses 5-6 pills a day.  Refilled Norco 5/325 #180 and given 3 month Rx.

## 2014-06-01 NOTE — Assessment & Plan Note (Signed)
Follows with Dr Blenda Nicelyhodri, currently well controlled.

## 2014-06-01 NOTE — Patient Instructions (Signed)
General Instructions:  Please take Benzoate (tessalon) as needed for cough.  You can also purchase OTC Mucinex to help with phlegm.   Thank you for bringing your medicines today. This helps us keep you safe from mistakes.   Progress Toward Treatment Goals:  Treatment Goal 06/01/2014  Blood pressure at goal  Stop smoking -  Prevent falls -    Self Care Goals & Plans:  Self Care Goal 06/01/2014  Manage my medications take my medicines as prescribed; refill my medications on time  Monitor my health -  Eat healthy foods -  Be physically active find an activity I enjoy  Stop smoking -  Meeting treatment goals maintain the current self-care plan    No flowsheet data found.   Care Management & Community Referrals:  Referral 06/01/2014  Referrals made for care management support none needed  Referrals made to community resources -     Benzonatate capsules What is this medicine? BENZONATATE (ben ZOE na tate) is used to treat cough. This medicine may be used for other purposes; ask your health care provider or pharmacist if you have questions. COMMON BRAND NAME(S): Tessalon Perles, Zonatuss What should I tell my health care provider before I take this medicine? They need to know if you have any of these conditions: -kidney or liver disease -an unusual or allergic reaction to benzonatate, anesthetics, other medicines, foods, dyes, or preservatives -pregnant or trying to get pregnant -breast-feeding How should I use this medicine? Take this medicine by mouth with a glass of water. Follow the directions on the prescription label. Avoid breaking, chewing, or sucking the capsule, as this can cause serious side effects. Take your medicine at regular intervals. Do not take your medicine more often than directed. Talk to your pediatrician regarding the use of this medicine in children. While this drug may be prescribed for children as young as 636 years old for selected conditions,  precautions do apply. Overdosage: If you think you have taken too much of this medicine contact a poison control center or emergency room at once. NOTE: This medicine is only for you. Do not share this medicine with others. What if I miss a dose? If you miss a dose, take it as soon as you can. If it is almost time for your next dose, take only that dose. Do not take double or extra doses. What may interact with this medicine? Do not take this medicine with any of the following medications: -MAOIs like Carbex, Eldepryl, Marplan, Nardil, and Parnate This list may not describe all possible interactions. Give your health care provider a list of all the medicines, herbs, non-prescription drugs, or dietary supplements you use. Also tell them if you smoke, drink alcohol, or use illegal drugs. Some items may interact with your medicine. What should I watch for while using this medicine? Tell your doctor if your symptoms do not improve or if they get worse. If you have a high fever, skin rash, or headache, see your health care professional. You may get drowsy or dizzy. Do not drive, use machinery, or do anything that needs mental alertness until you know how this medicine affects you. Do not sit or stand up quickly, especially if you are an older patient. This reduces the risk of dizzy or fainting spells. What side effects may I notice from receiving this medicine? Side effects that you should report to your doctor or health care professional as soon as possible: -allergic reactions like skin rash, itching or hives,  swelling of the face, lips, or tongue -breathing problems -chest pain -confusion or hallucinations -irregular heartbeat -numbness of mouth or throat -seizures Side effects that usually do not require medical attention (report to your doctor or health care professional if they continue or are bothersome): -burning feeling in the eyes -constipation -headache -nasal congestion -stomach  upset This list may not describe all possible side effects. Call your doctor for medical advice about side effects. You may report side effects to FDA at 1-800-FDA-1088. Where should I keep my medicine? Keep out of the reach of children. Store at room temperature between 15 and 30 degrees C (59 and 86 degrees F). Keep tightly closed. Protect from light and moisture. Throw away any unused medicine after the expiration date. NOTE: This sheet is a summary. It may not cover all possible information. If you have questions about this medicine, talk to your doctor, pharmacist, or health care provider.  2015, Elsevier/Gold Standard. (2008-01-12 14:52:56)

## 2014-06-01 NOTE — Assessment & Plan Note (Signed)
Plan to see Dr. Everardo BealsGeary with Vascular Surgery in 2 weeks.

## 2014-06-05 ENCOUNTER — Ambulatory Visit (INDEPENDENT_AMBULATORY_CARE_PROVIDER_SITE_OTHER): Payer: Medicare Other | Admitting: Pharmacist

## 2014-06-05 DIAGNOSIS — Z7901 Long term (current) use of anticoagulants: Secondary | ICD-10-CM

## 2014-06-05 DIAGNOSIS — I4891 Unspecified atrial fibrillation: Secondary | ICD-10-CM

## 2014-06-05 DIAGNOSIS — I480 Paroxysmal atrial fibrillation: Secondary | ICD-10-CM

## 2014-06-05 LAB — POCT INR: INR: 3

## 2014-06-05 NOTE — Patient Instructions (Signed)
Patient instructed to take medications as defined in the Anti-coagulation Track section of this encounter.  Patient instructed to take today's dose.  Patient verbalized understanding of these instructions.    

## 2014-06-05 NOTE — Progress Notes (Signed)
Anti-Coagulation Progress Note  Leonard Massey is a 68 y.o. male who is currently on an anti-coagulation regimen.    RECENT RESULTS: Recent results are below, the most recent result is correlated with a dose of 20 mg. per week: Lab Results  Component Value Date   INR 3.00 06/05/2014   INR 3.70 05/08/2014   INR 2.20 04/10/2014    ANTI-COAG DOSE: Anticoagulation Dose Instructions as of 06/05/2014     Glynis SmilesSun Mon Tue Wed Thu Fri Sat   New Dose 2.5 mg 2.5 mg 2.5 mg 0 mg 2.5 mg 2.5 mg 2.5 mg       ANTICOAG SUMMARY: Anticoagulation Episode Summary   Current INR goal 2.0-3.0  Next INR check 07/10/2014  INR from last check 3.00 (06/05/2014)  Weekly max dose   Target end date   [facility]  Preferred lab   Send INR reminders to    Indications  Paroxysmal a-fib [427.31] Long term (current) use of anticoagulants [V58.61]        Comments         ANTICOAG TODAY: Anticoagulation Summary as of 06/05/2014   INR goal 2.0-3.0  Selected INR 3.00 (06/05/2014)  Next INR check 07/10/2014  Target end date    Indications  Paroxysmal a-fib [427.31] Long term (current) use of anticoagulants [V58.61]      Anticoagulation Episode Summary   [facility]   Preferred lab    Send INR reminders to    Comments       PATIENT INSTRUCTIONS: Patient Instructions  Patient instructed to take medications as defined in the Anti-coagulation Track section of this encounter.  Patient instructed to take today's dose.  Patient verbalized understanding of these instructions.       FOLLOW-UP Return in 5 weeks (on 07/10/2014) for Follow up INR at 0930h.  Hulen LusterJames Treydon Henricks, III Pharm.D., CACP

## 2014-06-06 NOTE — Progress Notes (Signed)
INTERNAL MEDICINE TEACHING ATTENDING ADDENDUM - Special Ranes, MD: I reviewed and discussed at the time of visit with the resident Dr. Hoffman, the patient's medical history, physical examination, diagnosis and results of pertinent tests and treatment and I agree with the patient's care as documented.  

## 2014-07-10 ENCOUNTER — Ambulatory Visit (INDEPENDENT_AMBULATORY_CARE_PROVIDER_SITE_OTHER): Payer: Medicare Other | Admitting: Pharmacist

## 2014-07-10 DIAGNOSIS — I480 Paroxysmal atrial fibrillation: Secondary | ICD-10-CM

## 2014-07-10 DIAGNOSIS — I4891 Unspecified atrial fibrillation: Secondary | ICD-10-CM

## 2014-07-10 DIAGNOSIS — Z7901 Long term (current) use of anticoagulants: Secondary | ICD-10-CM

## 2014-07-10 LAB — POCT INR: INR: 3.5

## 2014-07-10 NOTE — Patient Instructions (Signed)
Patient instructed to take medications as defined in the Anti-coagulation Track section of this encounter.  Patient took today's dose this morning Patient verbalized understanding of these instructions.

## 2014-07-10 NOTE — Progress Notes (Signed)
Leonard Massey is a 68 y.o. male who is currently on an anti-coagulation regimen.    RECENT RESULTS: Recent results are below, the most recent result is correlated with a dose of 15 mg. per week: Lab Results  Component Value Date   INR 3.5 07/10/2014   INR 3.00 06/05/2014   INR 3.70 05/08/2014    ANTI-COAG DOSE: Anticoagulation Dose Instructions as of 07/10/2014     [name] Tue Wed Thu Fri Sat   New Dose 2.5 mg 2.5 mg 0 mg 2.5 mg 2.5 mg 0 mg 2.5 mg       ANTICOAG SUMMARY: Anticoagulation Episode Summary   Current INR goal 2.0-3.0  Next INR check 07/31/2014  INR from last check 3.5! (07/10/2014)  Weekly max dose   Target end date   [facility]  Preferred lab   Send INR reminders to    Indications  Paroxysmal a-fib [427.31] Long term (current) use of anticoagulants [V58.61]        Comments         ANTICOAG TODAY: Anticoagulation Summary as of 07/10/2014   INR goal 2.0-3.0  Selected INR 3.5! (07/10/2014)  Next INR check 07/31/2014  Target end date    Indications  Paroxysmal a-fib [427.31] Long term (current) use of anticoagulants [V58.61]      Anticoagulation Episode Summary   [facility]   Preferred lab    Send INR reminders to    Comments       PATIENT INSTRUCTIONS: Patient Instructions  Patient instructed to take medications as defined in the Anti-coagulation Track section of this encounter.  Patient took today's dose this morning Patient verbalized understanding of these instructions.    FOLLOW-UP No Follow-up on file.  Enzo Bi, PharmD PGY1 Pharmacy Resident

## 2014-07-31 ENCOUNTER — Ambulatory Visit (INDEPENDENT_AMBULATORY_CARE_PROVIDER_SITE_OTHER): Payer: Medicare Other | Admitting: Pharmacist

## 2014-07-31 ENCOUNTER — Ambulatory Visit (INDEPENDENT_AMBULATORY_CARE_PROVIDER_SITE_OTHER): Payer: Medicare Other | Admitting: *Deleted

## 2014-07-31 DIAGNOSIS — I480 Paroxysmal atrial fibrillation: Secondary | ICD-10-CM

## 2014-07-31 DIAGNOSIS — Z7901 Long term (current) use of anticoagulants: Secondary | ICD-10-CM

## 2014-07-31 DIAGNOSIS — Z23 Encounter for immunization: Secondary | ICD-10-CM

## 2014-07-31 LAB — POCT INR: INR: 2

## 2014-07-31 NOTE — Progress Notes (Signed)
Anti-Coagulation Progress Note  Leonard LeekJohn W Massey is a 68 y.o. male who is currently on an anti-coagulation regimen.    RECENT RESULTS: Recent results are below, the most recent result is correlated with a dose of 12.5 mg. per week: Lab Results  Component Value Date   INR 2.00 07/31/2014   INR 3.5 07/10/2014   INR 3.00 06/05/2014    ANTI-COAG DOSE: Anticoagulation Dose Instructions as of 07/31/2014     Glynis SmilesSun Mon Tue Wed Thu Fri Sat   New Dose 2.5 mg 2.5 mg 2.5 mg 2.5 mg 2.5 mg 2.5 mg 2.5 mg       ANTICOAG SUMMARY: Anticoagulation Episode Summary   Current INR goal 2.0-3.0  Next INR check 08/28/2014  INR from last check 2.00 (07/31/2014)  Weekly max dose   Target end date   [facility]  Preferred lab   Send INR reminders to    Indications  Paroxysmal a-fib [I48.0] Long term (current) use of anticoagulants [Z79.01]        Comments         ANTICOAG TODAY: Anticoagulation Summary as of 07/31/2014   INR goal 2.0-3.0  Selected INR 2.00 (07/31/2014)  Next INR check 08/28/2014  Target end date    Indications  Paroxysmal a-fib [I48.0] Long term (current) use of anticoagulants [Z79.01]      Anticoagulation Episode Summary   [facility]   Preferred lab    Send INR reminders to    Comments       PATIENT INSTRUCTIONS: Patient Instructions  Patient instructed to take medications as defined in the Anti-coagulation Track section of this encounter.  Patient instructed to take today's dose.  Patient verbalized understanding of these instructions.       FOLLOW-UP Return in 4 weeks (on 08/28/2014) for Follow up INR at 0930h.  [name], III Pharm.D., CACP

## 2014-07-31 NOTE — Patient Instructions (Signed)
Patient instructed to take medications as defined in the Anti-coagulation Track section of this encounter.  Patient instructed to take today's dose.  Patient verbalized understanding of these instructions.    

## 2014-07-31 NOTE — Progress Notes (Signed)
[name] is on anticoagulation for atrial fibrillation.  INR 2.  I have reviewed Dr. Saralyn PilarGroce's note.

## 2014-08-24 ENCOUNTER — Other Ambulatory Visit: Payer: Self-pay | Admitting: Internal Medicine

## 2014-08-28 ENCOUNTER — Ambulatory Visit (INDEPENDENT_AMBULATORY_CARE_PROVIDER_SITE_OTHER): Payer: Medicare Other | Admitting: Pharmacist

## 2014-08-28 DIAGNOSIS — I480 Paroxysmal atrial fibrillation: Secondary | ICD-10-CM

## 2014-08-28 LAB — POCT INR: INR: 2.3

## 2014-08-28 NOTE — Progress Notes (Signed)
Anti-Coagulation Progress Note  Leonard Massey is a 68 y.o. male who is currently on an anti-coagulation regimen.    RECENT RESULTS: Recent results are below, the most recent result is correlated with a dose of 17.5 mg. per week:  During our discussion patient asked:  "is there not anything else I can take instead of warfarin"?  I reviewed his indication (stroke prevention in setting of non-valvular atrial fibrillation) as well as his most current Scr and it appears that his renal function is about 4089ml/min creatinine clearance by method of Cockcroft-Gault using total body weight and most recent documented Scr on file. I asked him how he "pays" for his medications:  "I have Medicaid and Medicare", some of my medications cost $3.00+ and others even less". Given his concerns that he verbalizes specifically---he drives a round trip distance of 60+ [place] from [facility]([place] of [place]today he states "I am tired from my drive", when winter comes--I can't come here with snow and ice on the road". We talked about "DOACs"--direct oral anticoagulants. I mentioned there are 4 today to choose from--and with his "advice and consent" PLUS the input of his PCP Dr. Bradd CanaryHoffman--that perhaps they can have this discussion on 12-NOV-15 when the patient comes next for his PCP appointment. Of these, one currently has a known specific reversal agent (idarucizumab/Praxbind) for immediate reversal of dabigatran/Pradaxa---a Factor II or Thrombin inhibitor. The other agents (rivaroxaban/Xarelto would be 20mg  ONCE DAILY; apixaban TWICE DAILY--with dose reduction possible based upon age, WEIGHT and renal function; and most recently--edoxaban/Sayvasa at 60mg  PO ONCE DAILY. These agents at present time do not have an FDA agent-specific reversal but the healthcare system's approach in the ED is to use a Four Factor Prothrombin Concentrate-non-activated (Kcentra) which is NOT FDA approved but has been evaluated in small cohort patient  populations and as shown immediate reversal of laboratory surrogate markers (aPTT, ECT, etc.) for patients on Factor Xa inhibitors such as these drugs. I did indicate that these drugs have "quick onset" as well as "quick offset" *(which can be beneficial if the patient IS bleeding)--but the emphasis of this was around the concept that doses cannot be missed without him returning to his baseline risk of stroke "off" of these drugs if missed doses were to be encountered. At next visit with Dr. Mikey BussingHoffman he has been instructed to have this discussion with Dr. Mikey BussingHoffman.   Lab Results  Component Value Date   INR 2.30 08/28/2014   INR 2.00 07/31/2014   INR 3.5 07/10/2014    ANTI-COAG DOSE: Anticoagulation Dose Instructions as of 08/28/2014      Glynis SmilesSun Mon Tue Wed Thu Fri Sat   New Dose 2.5 mg 2.5 mg 2.5 mg 2.5 mg 2.5 mg 2.5 mg 2.5 mg       ANTICOAG SUMMARY: Anticoagulation Episode Summary    Current INR goal 2.0-3.0  Next INR check 09/25/2014  INR from last check 2.30 (08/28/2014)  Weekly max dose   Target end date   [facility] check [facility]  Preferred lab   Send INR reminders to    Indications  Paroxysmal a-fib [I48.0] Long term (current) use of anticoagulants [Z79.01]        Comments         ANTICOAG TODAY: Anticoagulation Summary as of 08/28/2014    INR goal 2.0-3.0  Selected INR 2.30 (08/28/2014)  Next INR check 09/25/2014  Target end date    Indications  Paroxysmal a-fib [I48.0] Long term (current) use of anticoagulants [  Z79.01]      Anticoagulation Episode Summary    [facility] check [facility]   Preferred lab    Send INR reminders to    Comments       PATIENT INSTRUCTIONS: Patient Instructions  Patient instructed to take medications as defined in the Anti-coagulation Track section of this encounter.  Patient instructed to take today's dose.  Patient verbalized understanding of these instructions.       FOLLOW-UP Return in 4 weeks (on 09/25/2014)  for Follow up INR at 1000h.  Hulen LusterJames Aniko Finnigan, III Pharm.D., CACP

## 2014-08-28 NOTE — Patient Instructions (Signed)
Patient instructed to take medications as defined in the Anti-coagulation Track section of this encounter.  Patient instructed to take today's dose.  Patient verbalized understanding of these instructions.    

## 2014-09-07 ENCOUNTER — Encounter: Payer: Self-pay | Admitting: Internal Medicine

## 2014-09-07 ENCOUNTER — Ambulatory Visit (INDEPENDENT_AMBULATORY_CARE_PROVIDER_SITE_OTHER): Payer: Medicare Other | Admitting: Internal Medicine

## 2014-09-07 VITALS — BP 122/75 | HR 61 | Temp 98.0°F | Wt 115.2 lb

## 2014-09-07 DIAGNOSIS — J449 Chronic obstructive pulmonary disease, unspecified: Secondary | ICD-10-CM

## 2014-09-07 DIAGNOSIS — G894 Chronic pain syndrome: Secondary | ICD-10-CM

## 2014-09-07 DIAGNOSIS — F17200 Nicotine dependence, unspecified, uncomplicated: Secondary | ICD-10-CM

## 2014-09-07 DIAGNOSIS — Z720 Tobacco use: Secondary | ICD-10-CM

## 2014-09-07 DIAGNOSIS — I779 Disorder of arteries and arterioles, unspecified: Secondary | ICD-10-CM

## 2014-09-07 DIAGNOSIS — I10 Essential (primary) hypertension: Secondary | ICD-10-CM

## 2014-09-07 DIAGNOSIS — I480 Paroxysmal atrial fibrillation: Secondary | ICD-10-CM

## 2014-09-07 DIAGNOSIS — R634 Abnormal weight loss: Secondary | ICD-10-CM

## 2014-09-07 DIAGNOSIS — R5383 Other fatigue: Secondary | ICD-10-CM

## 2014-09-07 DIAGNOSIS — Z7901 Long term (current) use of anticoagulants: Secondary | ICD-10-CM

## 2014-09-07 DIAGNOSIS — Z0000 Encounter for general adult medical examination without abnormal findings: Secondary | ICD-10-CM

## 2014-09-07 DIAGNOSIS — E785 Hyperlipidemia, unspecified: Secondary | ICD-10-CM

## 2014-09-07 DIAGNOSIS — R5381 Other malaise: Secondary | ICD-10-CM | POA: Insufficient documentation

## 2014-09-07 DIAGNOSIS — I739 Peripheral vascular disease, unspecified: Secondary | ICD-10-CM

## 2014-09-07 LAB — CBC
HCT: 41.1 % (ref 39.0–52.0)
HEMOGLOBIN: 13.8 g/dL (ref 13.0–17.0)
MCH: 29.1 pg (ref 26.0–34.0)
MCHC: 33.6 g/dL (ref 30.0–36.0)
MCV: 86.5 fL (ref 78.0–100.0)
PLATELETS: 265 10*3/uL (ref 150–400)
RBC: 4.75 MIL/uL (ref 4.22–5.81)
RDW: 16.9 % — ABNORMAL HIGH (ref 11.5–15.5)
WBC: 5.5 10*3/uL (ref 4.0–10.5)

## 2014-09-07 LAB — BASIC METABOLIC PANEL WITH GFR
BUN: 18 mg/dL (ref 6–23)
CALCIUM: 9.7 mg/dL (ref 8.4–10.5)
CO2: 26 mEq/L (ref 19–32)
Chloride: 104 mEq/L (ref 96–112)
Creat: 0.68 mg/dL (ref 0.50–1.35)
GFR, Est Non African American: >89 mL/min
GLUCOSE: 88 mg/dL (ref 70–99)
Potassium: 4.9 mEq/L (ref 3.5–5.3)
Sodium: 141 mEq/L (ref 135–145)

## 2014-09-07 LAB — LIPID PANEL
CHOL/HDL RATIO: 2.7 ratio
Cholesterol: 129 mg/dL (ref 0–200)
HDL: 48 mg/dL (ref >39)
LDL CALC: 64 mg/dL (ref 0–99)
Triglycerides: 83 mg/dL (ref <150)
VLDL: 17 mg/dL (ref 0–40)

## 2014-09-07 LAB — POCT INR: INR: 3.6

## 2014-09-07 LAB — TSH: TSH: 1.261 u[IU]/mL (ref 0.350–4.500)

## 2014-09-07 MED ORDER — APIXABAN 5 MG PO TABS: 5.0000 mg | ORAL_TABLET | Freq: Two times a day (BID) | ORAL | Status: DC

## 2014-09-07 MED ORDER — HYDROCODONE-ACETAMINOPHEN 5-325 MG PO TABS: 2.0000 | ORAL_TABLET | Freq: Three times a day (TID) | ORAL | Status: DC | PRN

## 2014-09-07 NOTE — Assessment & Plan Note (Signed)
On simvastatin 40mg  daily -Repeat Lipid panel today

## 2014-09-07 NOTE — Assessment & Plan Note (Signed)
Check INR: 3.6 will have patient d/c warfarin and wait 5 days before starting Eliquis.

## 2014-09-07 NOTE — Assessment & Plan Note (Signed)
  Assessment: Progress toward smoking cessation:  stopped smoking Barriers to progress toward smoking cessation:    Comments: using e-cig  Plan: Instruction/counseling given:  I commended patient for quitting and reviewed strategies for preventing relapses. Educational resources provided:  [employer] Designer, jewellery(1-800-QUIT-NOW) brochure Self management tools provided:  smoking cessation plan (STAR Quit Plan) Medications to assist with smoking cessation:  None Patient agreed to the following self-care plans for smoking cessation: go to the [employer] website (PumpkinSearch.com.eewww.quitlinenc.com)  Other plans: I advised patient there is not data about the effectiveness and dangers of E-cigarette use but that this was likely more benefitial than real cigarettes.  Recommended he eventually stop using nicotine altogether

## 2014-09-07 NOTE — Assessment & Plan Note (Signed)
Patient is currently followed by Dr. Everardo BealsGeary. Stenotic left carotid subclavian bypass graft and atherosclerosis at the origin of his left common carotid artery estimated by CT angiogram to be 70%.  Currently he is asymptomatic (not thought to be the cause of his malaise and fatigue by Dr. Everardo BealsGeary).  He has repeat doppler ultrasound scheduled for early next month.

## 2014-09-07 NOTE — Progress Notes (Signed)
[facility] Subjective:   Patient ID: Leonard Massey Service male   DOB: 07/25/1946 68 y.o.   MRN: [id number]  HPI: [name] is a 68 y.o. male with a PMH below who presents for regular follow up.  His chief concern today is his anticoagulation for A fib.  He has been on chronic warfarin therapy managed by Dr. Alexandria Massey in our clinic.  [place] [address] [place] and has to drive a considerable distance for this.  He has heard of some of the newer agents that do not require frequent monitoring and would like to know if he would be a candidate.  He has been keeping regular appointments with Dr. Adela Massey [facility]. He reports no current symptoms of chest pain, pressure, palpitation, he reports he can walk about to the parking lot before he gets SOB. He has seen Leonard Massey. And [facility] which showed apical variant hypertrophic cardiomyopathy with mild hypoperfusion suggestive of small vessel disease in hypertrophied segments. Estimated EF of 70%.  For his carotid artery stenosis he has been [facility].  He has an appointment next month for repeat U/S to assess how he is doing.  He continues to follow with Dr. Blenda Massey. For his COPD. He is using Breo and Albuterol.   [name] reports he is continuing to use e-cigerettes  His chronic back pain and neck pain which reports is doing "pretty good".  He notes this is usually worse in winter months.  He is taking 3 Norco most morning, 2 in afternoon/night.  He reports his #180 is lasting a full month.  Per our records he last received a colonoscopy in 2005 and was due for repeat in 2010. He does not think he has had one since that time (although we have a note below in the EMR that he has previously reported a normal EGD/colonoscopy in 2010 by Dr Chales AbrahamsGupta). He reports he has been following with Dr. Chales AbrahamsGupta (gasteroentrology) in [place] he did an imaging study to evaluate for PUD which I do not have  but he prefers to defer any endoscopy until a decision has been made about the carotid artery stenosis.  Cardiologist: Dr. Cory Massey, [facility] Pulmonologist: Dr. Rene Massey, [facility]  Gastroenterologist: Dr. Janene Massey, [place] Past Medical History  Diagnosis Date  . COPD (chronic obstructive pulmonary disease)   . Depression   . GERD (gastroesophageal reflux disease)     Pt reports having EGD/colonoscopy in 2010 by Dr. Chales [place]Gupta in [place] wich showed ulcers on uppper and normal colon. No report in emr.   . Hyperlipidemia     Pt typically has low HDL and low LDL.   [name]. Hypertension   . Low back pain   . Cervical spondylosis   . Alcoholism   . Subclavian steal syndrome     HX of.   . Right knee pain 8/10    Began to complain of after requesting a scooter.  Exam was negative and it was decided that pt should continue to ambulate.    Current Outpatient Prescriptions  Medication Sig Dispense Refill  . albuterol (PROAIR HFA) 108 (90 BASE) MCG/ACT inhaler Inhale 1-2 puffs into the lungs every 6 (six) hours as needed for wheezing or shortness of breath.    [name]. apixaban (ELIQUIS) 5 MG TABS tablet Take 1 tablet (5 mg total) by mouth 2 (two) times daily. 60 tablet 5  . aspirin 81 MG EC tablet Take 81  mg by mouth daily.      . carvedilol (COREG) 6.25 MG tablet Take 6.25 mg by mouth.    . EPINEPHrine (EPIPEN 2-PAK) 0.3 mg/0.3 mL SOAJ injection as needed.    [name] esomeprazole (NEXIUM) 40 MG capsule Take 1 capsule (40 mg total) by mouth daily before breakfast. 90 capsule 4  . fluticasone (FLONASE) 50 MCG/ACT nasal spray Place into the nose.    [name] Fluticasone Furoate-Vilanterol (BREO ELLIPTA) 100-25 MCG/INH AEPB Inhale 1 Inhaler into the lungs daily.    [name] HYDROcodone-acetaminophen (NORCO/VICODIN) 5-325 MG per tablet Take 2 tablets by mouth every 8 (eight) hours as needed (for pain). 180 tablet 0  . sertraline (ZOLOFT) 50 MG tablet TAKE 1 TABLET BY MOUTH DAILY 30 tablet 5  . simvastatin (ZOCOR) 40 MG tablet  Take 1 tablet (40 mg total) by mouth at bedtime. 90 tablet 4   No current facility-administered medications for this visit.   No family history on file. History   Social History  . Marital Status: Divorced    Spouse Name: [name]    Number of Children: [name]  . Years of Education: [name]   Social History Main Topics  . Smoking status: Current Some Day Smoker -- 0.00 packs/day    Types: Cigarettes    Start date: 07/15/2013    Last Attempt to Quit: 08/14/2013  . Smokeless tobacco: Former Neurosurgeon    Quit date: 02/01/2012     Comment: Using E-cigs  . Alcohol Use: None  . Drug Use: None  . Sexual Activity: None   Other Topics Concern  . None   Social History Narrative   Disabled several years, On SSD[place] and [place].   Lives with disabled girlfriend in a trailer.  Works around [employer] and keeps busy.    Review of Systems: Review of Systems  Constitutional: Positive for weight loss (5 lbs) and malaise/fatigue. Negative for fever and chills.  HENT: Negative for nosebleeds.   Eyes: Negative for blurred vision and double vision.  Respiratory: Positive for shortness of breath. Negative for cough.   Cardiovascular: Negative for chest pain and leg swelling.  Gastrointestinal: Negative for heartburn, abdominal pain, diarrhea, constipation and blood in stool.  Musculoskeletal: Positive for back pain and neck pain.  Skin: Positive for itching (occasionally on back). Negative for rash.  Neurological: Positive for weakness. Negative for dizziness and headaches.  Psychiatric/Behavioral: Negative for depression.     Objective:  Physical Exam: Filed Vitals:   09/07/14 1320  BP: 122/75  Pulse: 61  Temp: 98 F (36.7 C)  TempSrc: Oral  Weight: 115 lb 3.2 oz (52.254 kg)  SpO2: 97%  Physical Exam  Constitutional: No distress.  Thin elderly male  HENT:  Head: Normocephalic and atraumatic.  Neck: Normal range of motion. Neck supple.  Cardiovascular: Normal rate and regular rhythm.   No murmur  heard. No cartoid burit bilaterally, 2+ pulses  Pulmonary/Chest: Effort normal and breath sounds normal. No respiratory distress. He has no wheezes.  Abdominal: Soft. Bowel sounds are normal. He exhibits no distension. There is no tenderness.  Musculoskeletal:  Right arm can raise to ~90 degrees, no cervical or thoracic spinal tenderness.  Able to rotate head ~45 degrees bilaterally.  Nursing note and vitals reviewed.    Wt Readings from Last 5 Encounters:  09/07/14 115 lb 3.2 oz (52.254 kg)  06/01/14 120 lb (54.432 kg)  01/12/14 133 lb 12.8 oz (60.691 kg)  06/02/13 135 lb 11.2 oz (61.553 kg)  10/07/12 137 lb 4.8  oz (62.279 kg)    Assessment & Plan:  Case discussed with Dr. Josem Kaufmann  Paroxysmal a-fib Patient is doing well on Coreg 6.25 and Warfarin however does not like the frequent monitoring of Warfarin and is interested in changing to a newer agent.  We discussed a few of the newer agents and that they have been shown to have lower rates of bleeding but there is currently limited options to treat in the case of a life threatening bleed.  We have decided to start Eliquis 5mg  BID.  I checked an INR in clinic today which was elevated at 3.6.  I have instructed him to stop taking warfarin for 5 days before starting Eliquis to allow for his INR to trend lower than 2.  He agreed and was able to teach back to me these instructions.  Carotid arterial disease Patient is currently followed by Dr. Everardo Beals. Stenotic left carotid subclavian bypass graft and atherosclerosis at the origin of his left common carotid artery estimated by CT angiogram to be 70%.  Currently he is asymptomatic (not thought to be the cause of his malaise and fatigue by Dr. Everardo Beals).  He has repeat doppler ultrasound scheduled for early next month.  Tobacco use disorder  Assessment: Progress toward smoking cessation:  stopped smoking Barriers to progress toward smoking cessation:    Comments: using  e-cig  Plan: Instruction/counseling given:  I commended patient for quitting and reviewed strategies for preventing relapses. Educational resources provided:  [employer]uitline[employer] Designer, jewellery) brochure Self management tools provided:  smoking cessation plan (STAR Quit Plan) Medications to assist with smoking cessation:  None Patient agreed to the following self-care plans for smoking cessation: go to the [employer] (PumpkinSearch.com.ee)  Other plans: I advised patient there is not data about the effectiveness and dangers of E-cigarette use but that this was likely more benefitial than real cigarettes.  Recommended he eventually stop using nicotine altogether     Routine adult health maintenance Needs repeat Colonoscopy.  Has Seen his GI- Dr. [name] who prefers to wait until he sees vascular surgery again to evaluate his carotid stenosis.  Malaise and fatigue Patient now has had chronic malaise and fatigue ongoing for the last few months despite evaluations by GI, Cardiology, Vascular Surgery, Pulmonology.  We do not have any recent labs in our system but in review of care everywhere from [facility] we can see that he has had a normal TSH in 2014 and Hgb has been normal in the past.  He has lost some weight over the past few months (5lbs since last visit).  He does have ongoing follow up with his Gastroenterologist and should likely need a repeat colonoscopy and possibly EGD.  It does seem like much of this has started after his diagnosis of PAF and I do wonder if his BB could be contributing to these symptoms. Will check a TSH, CBC, BMP today. - Will have patient follow up in 3 months.    COPD, severe No wheezing on exam. No SOB. Stable. -Continue current medications.  Essential hypertension BP Readings from Last 3 Encounters:  09/07/14 122/75  06/01/14 131/80  01/12/14 120/80    Lab Results  Component Value Date   NA 138 06/02/2013   K 4.6 06/02/2013   CREATININE 0.77  06/02/2013    Assessment: Blood pressure control: controlled Progress toward BP goal:  at goal Comments:   Plan: Medications:  Coreg 6.25 BID Educational resources provided: brochure Self management tools provided: home blood pressure logbook Other  plans: check BMP   Long term current use of anticoagulant therapy Check INR: 3.6 will have patient d/c warfarin and wait 5 days before starting Eliquis.  Loss of weight Continues to report this is due to decreased appetite.  Unclear of etiology, patient likely needs repeat EGD and Colonoscopy, is seeing his gastroenterologist but wants to delay any interventions pending repeat eval of his carotid artery stenosis.  Chronic pain syndrome Patient has chronic pain from his DDD in his neck and back.  He reports he is doing very well on his current regimen of 5/325mg  of Norco.  He has had no red flag behavior and his last 3 months Rx lasted just over 3 months.  Patient given 3 additional months of Norco #180 prescription.  Will assess UDS at next visit.  Hyperlipidemia On simvastatin 40mg  daily -Repeat Lipid panel today    Medications Ordered Meds ordered this encounter  Medications  . DISCONTD: HYDROcodone-acetaminophen (NORCO/VICODIN) 5-325 MG per tablet    Sig: Take 2 tablets by mouth every 8 (eight) hours as needed (for pain).    Dispense:  180 tablet    Refill:  0    Rx 1/3  . DISCONTD: HYDROcodone-acetaminophen (NORCO/VICODIN) 5-325 MG per tablet    Sig: Take 2 tablets by mouth every 8 (eight) hours as needed (for pain).    Dispense:  180 tablet    Refill:  0    Rx 2/3 to be filled 30 days after printed date  . HYDROcodone-acetaminophen (NORCO/VICODIN) 5-325 MG per tablet    Sig: Take 2 tablets by mouth every 8 (eight) hours as needed (for pain).    Dispense:  180 tablet    Refill:  0    Rx 3/3 to be filled 30 days after printed date  . apixaban (ELIQUIS) 5 MG TABS tablet    Sig: Take 1 tablet (5 mg total) by mouth 2 (two)  times daily.    Dispense:  60 tablet    Refill:  5   Other Orders Orders Placed This Encounter  Procedures  . BMP with Estimated GFR (CPT-80048)  . CBC no Diff  . TSH  . Lipid Profile  . POCT INR

## 2014-09-07 NOTE — Assessment & Plan Note (Signed)
Patient is doing well on Coreg 6.25 and Warfarin however does not like the frequent monitoring of Warfarin and is interested in changing to a newer agent.  We discussed a few of the newer agents and that they have been shown to have lower rates of bleeding but there is currently limited options to treat in the case of a life threatening bleed.  We have decided to start Eliquis 5mg  BID.  I checked an INR in clinic today which was elevated at 3.6.  I have instructed him to stop taking warfarin for 5 days before starting Eliquis to allow for his INR to trend lower than 2.  He agreed and was able to teach back to me these instructions.

## 2014-09-07 NOTE — Patient Instructions (Addendum)
General Instructions: Stop taking Warfarin  You can start taking Eliquis 5mg  twice a day in 5 days (Tuesday the 17th).  Please continue to follow up with your other doctors as instructed.  Please bring your medicines with you each time you come to clinic.  Medicines may include prescription medications, over-the-counter medications, herbal remedies, eye drops, vitamins, or other pills.   Progress Toward Treatment Goals:  Treatment Goal 06/01/2014  Blood pressure at goal  Stop smoking -  Prevent falls -    Self Care Goals & Plans:  Self Care Goal 09/07/2014  Manage my medications take my medicines as prescribed; bring my medications to every visit; refill my medications on time  Monitor my health -  Eat healthy foods eat baked foods instead of fried foods; eat foods that are low in salt  Be physically active find an activity I enjoy  Stop smoking go to the [employer] website (PumpkinSearch.com.eewww.quitlinenc.com)  Meeting treatment goals -    No flowsheet data found.   Care Management & Community Referrals:  Referral 06/01/2014  Referrals made for care management support none needed  Referrals made to community resources -

## 2014-09-07 NOTE — Assessment & Plan Note (Signed)
BP Readings from Last 3 Encounters:  09/07/14 122/75  06/01/14 131/80  01/12/14 120/80    Lab Results  Component Value Date   NA 138 06/02/2013   K 4.6 06/02/2013   CREATININE 0.77 06/02/2013    Assessment: Blood pressure control: controlled Progress toward BP goal:  at goal Comments:   Plan: Medications:  Coreg 6.25 BID Educational resources provided: brochure Self management tools provided: home blood pressure logbook Other plans: check BMP

## 2014-09-07 NOTE — Assessment & Plan Note (Addendum)
Patient now has had chronic malaise and fatigue ongoing for the last few months despite evaluations by GI, Cardiology, Vascular Surgery, Pulmonology.  We do not have any recent labs in our system but in review of care everywhere from [facility] we can see that he has had a normal TSH in 2014 and Hgb has been normal in the past.  He has lost some weight over the past few months (5lbs since last visit).  He does have ongoing follow up with his Gastroenterologist and should likely need a repeat colonoscopy and possibly EGD.  It does seem like much of this has started after his diagnosis of PAF and I do wonder if his BB could be contributing to these symptoms. Will check a TSH, CBC, BMP today. - Will have patient follow up in 3 months.

## 2014-09-07 NOTE — Assessment & Plan Note (Signed)
Needs repeat Colonoscopy.  Has Seen his GI- Dr. Chales AbrahamsGupta who prefers to wait until he sees vascular surgery again to evaluate his carotid stenosis.

## 2014-09-07 NOTE — Assessment & Plan Note (Signed)
Continues to report this is due to decreased appetite.  Unclear of etiology, patient likely needs repeat EGD and Colonoscopy, is seeing his gastroenterologist but wants to delay any interventions pending repeat eval of his carotid artery stenosis.

## 2014-09-07 NOTE — Assessment & Plan Note (Signed)
Patient has chronic pain from his DDD in his neck and back.  He reports he is doing very well on his current regimen of 5/325mg  of Norco.  He has had no red flag behavior and his last 3 months Rx lasted just over 3 months.  Patient given 3 additional months of Norco #180 prescription.  Will assess UDS at next visit.

## 2014-09-07 NOTE — Assessment & Plan Note (Signed)
No wheezing on exam. No SOB. Stable. -Continue current medications.

## 2014-09-08 ENCOUNTER — Telehealth: Payer: Self-pay | Admitting: *Deleted

## 2014-09-08 ENCOUNTER — Encounter: Payer: Self-pay | Admitting: Internal Medicine

## 2014-09-08 NOTE — Progress Notes (Signed)
Case discussed with Dr. Hoffman at the time of the visit.  We reviewed the resident's history and exam and pertinent patient test results.  I agree with the assessment, diagnosis and plan of care documented in the resident's note. 

## 2014-09-08 NOTE — Telephone Encounter (Signed)
Received faxed request from pt's pharmacy for his Eliquis 5mg  tabs-once twice a day. Contacted pt's insurance at [phone] to initiate PA, approved until Nov 13th, 2016, pharmacy aware.Leonard SpittleGoldston, Leonard Jafri Cassady11/13/201511:26 AM      Dx Paroxysmal A-fib (I48.0) Pt ID# [id number]  Ref # [id number]

## 2014-09-25 ENCOUNTER — Ambulatory Visit: Payer: Medicare Other

## 2014-11-15 ENCOUNTER — Encounter: Payer: Self-pay | Admitting: Internal Medicine

## 2014-11-15 DIAGNOSIS — Z79891 Long term (current) use of opiate analgesic: Secondary | ICD-10-CM | POA: Insufficient documentation

## 2014-12-07 ENCOUNTER — Encounter: Payer: Medicare Other | Admitting: Internal Medicine

## 2014-12-14 ENCOUNTER — Encounter: Payer: Self-pay | Admitting: Internal Medicine

## 2014-12-14 ENCOUNTER — Ambulatory Visit (INDEPENDENT_AMBULATORY_CARE_PROVIDER_SITE_OTHER): Payer: Medicare Other | Admitting: Internal Medicine

## 2014-12-14 VITALS — BP 132/103 | HR 96 | Temp 98.1°F | Ht 66.0 in | Wt 115.8 lb

## 2014-12-14 DIAGNOSIS — F17200 Nicotine dependence, unspecified, uncomplicated: Secondary | ICD-10-CM

## 2014-12-14 DIAGNOSIS — I739 Peripheral vascular disease, unspecified: Secondary | ICD-10-CM

## 2014-12-14 DIAGNOSIS — Z122 Encounter for screening for malignant neoplasm of respiratory organs: Secondary | ICD-10-CM

## 2014-12-14 DIAGNOSIS — I10 Essential (primary) hypertension: Secondary | ICD-10-CM

## 2014-12-14 DIAGNOSIS — I779 Disorder of arteries and arterioles, unspecified: Secondary | ICD-10-CM

## 2014-12-14 DIAGNOSIS — F17210 Nicotine dependence, cigarettes, uncomplicated: Secondary | ICD-10-CM

## 2014-12-14 DIAGNOSIS — Z7901 Long term (current) use of anticoagulants: Secondary | ICD-10-CM

## 2014-12-14 DIAGNOSIS — G894 Chronic pain syndrome: Secondary | ICD-10-CM

## 2014-12-14 DIAGNOSIS — Z79891 Long term (current) use of opiate analgesic: Secondary | ICD-10-CM

## 2014-12-14 DIAGNOSIS — I480 Paroxysmal atrial fibrillation: Secondary | ICD-10-CM

## 2014-12-14 DIAGNOSIS — J449 Chronic obstructive pulmonary disease, unspecified: Secondary | ICD-10-CM

## 2014-12-14 DIAGNOSIS — R634 Abnormal weight loss: Secondary | ICD-10-CM

## 2014-12-14 MED ORDER — HYDROCODONE-ACETAMINOPHEN 10-325 MG PO TABS: 1.0000 | ORAL_TABLET | Freq: Three times a day (TID) | ORAL | Status: DC | PRN

## 2014-12-14 NOTE — Patient Instructions (Signed)
General Instructions: I have ordered you a CT of your chest to screen for lung cancer.  Please bring your medicines with you each time you come to clinic.  Medicines may include prescription medications, over-the-counter medications, herbal remedies, eye drops, vitamins, or other pills.   Progress Toward Treatment Goals:  Treatment Goal 12/14/2014  Blood pressure deteriorated  Stop smoking smoking less  Prevent falls -    Self Care Goals & Plans:  Self Care Goal 12/14/2014  Manage my medications take my medicines as prescribed; bring my medications to every visit; refill my medications on time  Monitor my health -  Eat healthy foods drink diet soda or water instead of juice or soda; eat more vegetables; eat fruit for snacks and desserts  Be physically active take a walk every day  Stop smoking -  Meeting treatment goals -    No flowsheet data found.   Care Management & Community Referrals:  Referral 12/14/2014  Referrals made for care management support none needed  Referrals made to community resources -

## 2014-12-14 NOTE — Progress Notes (Signed)
[facility] Subjective:   Patient ID: Leonard Massey male   DOB: Jun 05, 1946 69 y.o.   MRN: [id number]  HPI: [name] is a 69 y.o. male with a PMH below who presents for regular follow up.   He has been keeping regular appointments with [facility]. He has been discussing possible ablation and PPM implantation.  I have reviewed their notes via care everywhere.  For his carotid artery stenosis he has been seeing [name] at [facility]  They are continuing perodic monitoring and medical therapy.  He continues to follow with [name]. For his COPD. He is using Breo and Albuterol.   Mr Leonard Massey reports he is continuing to use e-cigerettes with occaisonal rare use of actual cigerette.  He smoked from the age of 69 till last year.  He reports an average of 1.5 packs per day.  His chronic back pain and neck pain which reports is doing about the same.  He notes by taking 2 pills of norco tid he is able to do much of his daily activies without being in severe pain.  He did have a  Colonoscopy with Dr. [name] last week which he reports was normal, we have yet to receive the report.  He notes that Dr. [name] has told him that some of his satiety and weight loss may be from the scarring from his previous PUD.  He believes his weight has stabliszed this last month but still has a low appetitie.  Cardiologist: [name], [place] Pulmonologist: [name] [facility]  Gastroenterologist: [name] Past Medical History  Diagnosis Date  . COPD (chronic obstructive pulmonary disease)   . Depression   . GERD (gastroesophageal reflux disease)     Pt reports having EGD/colonoscopy in 2010 by Dr. [name] in [place] wich showed ulcers on uppper and normal colon. No report in emr.   . Hyperlipidemia     Pt typically has low HDL and low LDL.   [name] Hypertension   . Low back pain   . Cervical spondylosis   . Alcoholism   . Subclavian steal syndrome     HX of.   .  Right knee pain 8/10    Began to complain of after requesting a scooter.  Exam was negative and it was decided that pt should continue to ambulate.    Current Outpatient Prescriptions  Medication Sig Dispense Refill  . albuterol (PROAIR HFA) 108 (90 BASE) MCG/ACT inhaler Inhale 1-2 puffs into the lungs every 6 (six) hours as needed for wheezing or shortness of breath.    [name] apixaban (ELIQUIS) 5 MG TABS tablet Take 1 tablet (5 mg total) by mouth 2 (two) times daily. 60 tablet 5  . aspirin 81 MG EC tablet Take 81 mg by mouth daily.      . carvedilol (COREG) 6.25 MG tablet Take 6.25 mg by mouth.    . esomeprazole (NEXIUM) 40 MG capsule Take 1 capsule (40 mg total) by mouth daily before breakfast. 90 capsule 4  . fluticasone (FLONASE) 50 MCG/ACT nasal spray Place into the nose.    [name] Fluticasone Furoate-Vilanterol (BREO ELLIPTA) 100-25 MCG/INH AEPB Inhale 1 Inhaler into the lungs daily.    [name] HYDROcodone-acetaminophen (NORCO) 10-325 MG per tablet Take 1 tablet by mouth every 8 (eight) hours as needed for severe pain. 90 tablet 0  . sertraline (ZOLOFT) 50 MG tablet TAKE 1 TABLET BY MOUTH DAILY 30 tablet 5  . simvastatin (ZOCOR) 40 MG tablet  Take 1 tablet (40 mg total) by mouth at bedtime. 90 tablet 4   No current facility-administered medications for this visit.   No family history on file. History   Social History  . Marital Status: Divorced    Spouse Name: [name]  . Number of Children: [name]  . Years of Education: [name]   Social History Main Topics  . Smoking status: Current Some Day Smoker -- 0.00 packs/day    Types: Cigarettes    Start date: 07/15/2013    Last Attempt to Quit: 08/14/2013  . Smokeless tobacco: Former Neurosurgeon    Quit date: 02/01/2012     Comment: Using E-cigs  . Alcohol Use: No  . Drug Use: Not on file  . Sexual Activity: Not on file   Other Topics Concern  . None   Social History Narrative   Disabled several years, [place] and [place].   Lives with disabled girlfriend  in a trailer.  Works around [employer] and keeps busy.    Review of Systems: Review of Systems  Constitutional: Positive for weight loss and malaise/fatigue. Negative for fever and chills.  Eyes: Negative for blurred vision.  Respiratory: Negative for cough, sputum production, shortness of breath and wheezing.   Cardiovascular: Negative for chest pain.  Gastrointestinal: Negative for abdominal pain.  Genitourinary: Negative for dysuria.  Musculoskeletal: Positive for neck pain.  Neurological: Negative for sensory change, speech change, focal weakness, weakness and headaches.  Psychiatric/Behavioral: Positive for depression.     Objective:  Physical Exam: Filed Vitals:   12/14/14 1509 12/14/14 1613  BP: 176/98 132/103  Pulse: 98 96  Temp: 98.1 F (36.7 C)   Height:  (1.676 m)   Weight: 115 lb 12.8 oz (52.527 kg)   SpO2: 98%   Physical Exam  Constitutional: He is well-developed, well-nourished, and in no distress.  thin  HENT:  Head: Normocephalic and atraumatic.  Cardiovascular:  No murmur heard. irregular  Pulmonary/Chest: Effort normal and breath sounds normal. No respiratory distress. He has no wheezes.  Abdominal: Soft. Bowel sounds are normal.  Musculoskeletal: He exhibits no edema.  Nursing note and vitals reviewed.    Wt Readings from Last 5 Encounters:  12/14/14 115 lb 12.8 oz (52.527 kg)  09/07/14 115 lb 3.2 oz (52.254 kg)  06/01/14 120 lb (54.432 kg)  01/12/14 133 lb 12.8 oz (60.691 kg)  06/02/13 135 lb 11.2 oz (61.553 kg)    Assessment & Plan:  Case discussed with Dr. Cyndie Chime  Paroxysmal a-fib On coreg>> (may be better idea to have selective BB given COPD but doing well pulm wise at this time and will not make changes especially given his hsitroy of syncope with BP medication changes.) - Eliquis for A/C   Essential hypertension - Diastolic BP elevated today, will continue to monitor and continue eliquis, do not need agressive control at this  time given previous syncope.   COPD, severe - Currently stable.   Tobacco use disorder - Has not been able to wean off e-cigs yet and still smoking the occasional cigerette. - Given his weight loss we discussed the possibility of screening for lung cancer with a low dose CT scan and he wants to procede (his cardiologist talked with him recently about getting a chest Xray) - He has about an 80 pack year history   Loss of weight -Weight today at 115.  He has undergone screening with his GI doctor.  His TSH was recently checked and normal.  Given his smoking history  will screen for lung ca with a low dose CT scan.   Chronic pain syndrome - Patient doing well with Norco 5/325 #180 a month, he is regularly taking 2 pills TID.  He ran out 3 days ago and I will defer checking a UDStill next visit.  After he left I realized we also need to update his pain medication contract. Will do this at next visit.  I have also decided to changed him to Norco 10-325 with number of 90.  He is provided with 3 months of Rx.     Medications Ordered Meds ordered this encounter  Medications  . DISCONTD: HYDROcodone-acetaminophen (NORCO) 10-325 MG per tablet    Sig: Take 1 tablet by mouth every 8 (eight) hours as needed for severe pain.    Dispense:  90 tablet    Refill:  0    Rx 1/3  . DISCONTD: HYDROcodone-acetaminophen (NORCO) 10-325 MG per tablet    Sig: Take 1 tablet by mouth every 8 (eight) hours as needed for severe pain.    Dispense:  90 tablet    Refill:  0    Rx 2/3 to be filled 30 days after printed date  . HYDROcodone-acetaminophen (NORCO) 10-325 MG per tablet    Sig: Take 1 tablet by mouth every 8 (eight) hours as needed for severe pain.    Dispense:  90 tablet    Refill:  0    Rx 3/3 to be filled 60 days after printed date   Other Orders Orders Placed This Encounter  Procedures  . CT CHEST LUNG CA SCREEN LOW DOSE W/O CM    Standing Status: Future     Number of Occurrences:       Standing Expiration Date: 02/12/2016    Order Specific Question:  Reason for Exam (SYMPTOM  OR DIAGNOSIS REQUIRED)    Answer:  weight loss in 80 pack year smoker, recently switched to e-cigerettes in last year    Order Specific Question:  Preferred Imaging Location?    Answer:  [facility]

## 2014-12-14 NOTE — Progress Notes (Signed)
Medicine attending: Medical history, presenting problems, physical findings, and medications, reviewed with Dr Erik Hoffman and I concur with  His evaluation and management plan. 

## 2014-12-15 ENCOUNTER — Telehealth: Payer: Self-pay | Admitting: *Deleted

## 2014-12-15 NOTE — Assessment & Plan Note (Signed)
-   Diastolic BP elevated today, will continue to monitor and continue eliquis, do not need agressive control at this time given previous syncope.

## 2014-12-15 NOTE — Assessment & Plan Note (Signed)
On coreg>> (may be better idea to have selective BB given COPD but doing well pulm wise at this time and will not make changes especially given his hsitroy of syncope with BP medication changes.) - Eliquis for A/C

## 2014-12-15 NOTE — Assessment & Plan Note (Signed)
-  Weight today at 115.  He has undergone screening with his GI doctor.  His TSH was recently checked and normal.  Given his smoking history will screen for lung ca with a low dose CT scan.

## 2014-12-15 NOTE — Assessment & Plan Note (Signed)
-   Patient doing well with Norco 5/325 #180 a month, he is regularly taking 2 pills TID.  He ran out 3 days ago and I will defer checking a UDStill next visit.  After he left I realized we also need to update his pain medication contract. Will do this at next visit.  I have also decided to changed him to Norco 10-325 with number of 90.  He is provided with 3 months of Rx.

## 2014-12-15 NOTE — Assessment & Plan Note (Signed)
-   Has not been able to wean off e-cigs yet and still smoking the occasional cigerette. - Given his weight loss we discussed the possibility of screening for lung cancer with a low dose CT scan and he wants to procede (his cardiologist talked with him recently about getting a chest Xray) - He has about an 80 pack year history

## 2014-12-15 NOTE — Telephone Encounter (Signed)
Pt called / informed of appt for CT chest/lung CA screen low dose w/o cm on Tues 2/23 @ 1330PM; arrive @ 1315PM. Information given to wife also per pt's request; instructed to call [phone] if time/date inconvenient.

## 2014-12-15 NOTE — Assessment & Plan Note (Signed)
Currently stable.

## 2014-12-19 ENCOUNTER — Encounter (HOSPITAL_COMMUNITY): Payer: Self-pay

## 2014-12-19 ENCOUNTER — Ambulatory Visit (HOSPITAL_COMMUNITY)
Admission: RE | Admit: 2014-12-19 | Discharge: 2014-12-19 | Disposition: A | Discharge status: Home / Self Care | Payer: Medicare Other | Source: Ambulatory Visit | Attending: Oncology | Admitting: Oncology

## 2014-12-19 DIAGNOSIS — R9389 Abnormal findings on diagnostic imaging of other specified body structures: Secondary | ICD-10-CM

## 2014-12-19 DIAGNOSIS — Z122 Encounter for screening for malignant neoplasm of respiratory organs: Secondary | ICD-10-CM | POA: Diagnosis present

## 2014-12-19 DIAGNOSIS — Z87891 Personal history of nicotine dependence: Secondary | ICD-10-CM | POA: Diagnosis not present

## 2014-12-19 DIAGNOSIS — R918 Other nonspecific abnormal finding of lung field: Secondary | ICD-10-CM | POA: Insufficient documentation

## 2014-12-20 ENCOUNTER — Encounter: Payer: Self-pay | Admitting: Internal Medicine

## 2014-12-20 DIAGNOSIS — R9389 Abnormal findings on diagnostic imaging of other specified body structures: Secondary | ICD-10-CM | POA: Insufficient documentation

## 2014-12-25 ENCOUNTER — Telehealth: Payer: Self-pay | Admitting: *Deleted

## 2014-12-25 NOTE — Telephone Encounter (Signed)
Pt called for results CT 12/19/14 - letter read to pt 12/20/14 by Dr Mikey BussingHoffman. Will make appt early 02/2015 to sch anoth CT. Croy Drumwright RN 2.29.16 10:50AM

## 2014-12-26 HISTORY — PX: OTHER SURGICAL HISTORY: SHX169

## 2014-12-28 ENCOUNTER — Telehealth: Payer: Self-pay | Admitting: Internal Medicine

## 2014-12-28 NOTE — Telephone Encounter (Signed)
Received a call from [name]'s cardiologist Dr. [facility] that he was considering an ablation and PPM.  He noted that he felt Mr. Leonard Massey would be a candidate however his sister had many questions about the lung nodules on CT scan that I had ordered and potentially wanted to delay this for that workup to be complete.   I gave Mr Leonard Massey a call and once again reassured him the the nodules do not look concerning but simply need a follow up CT, he also gave me permission to discuss this with his sister [name], I gave her a call at [phone].  I discussed the results of his CT and that the small nodules favor a possible infectious process but that we will simply follow it up with a repeat scan to see their change in size and that I do not feel that should necessarily delay procedures that may be of benefit.  I also expressed my condolences at the recent loss of her husband (she reports this has added to her anxiety about losing her brother)  She appreciated the call and reports she will talk with [name] and Dr. Sung AmabileSimonds about the ablation.

## 2015-01-04 HISTORY — PX: PACEMAKER INSERTION: SHX728

## 2015-02-11 ENCOUNTER — Other Ambulatory Visit: Payer: Self-pay | Admitting: Internal Medicine

## 2015-02-17 ENCOUNTER — Other Ambulatory Visit: Payer: Self-pay | Admitting: Internal Medicine

## 2015-02-25 DIAGNOSIS — I2510 Atherosclerotic heart disease of native coronary artery without angina pectoris: Secondary | ICD-10-CM

## 2015-02-25 HISTORY — DX: Atherosclerotic heart disease of native coronary artery without angina pectoris: I25.10

## 2015-03-06 ENCOUNTER — Telehealth: Payer: Self-pay | Admitting: Acute Care

## 2015-03-06 NOTE — Telephone Encounter (Signed)
I called and left a message for Leonard Massey in an attempt to schedule his 3 month follow up CT scan. Previous LDCT showed 4 A nodule requiring a 3 month follow up.I left my name and contact information, and asked that he return my call. I will await his return call.

## 2015-03-09 ENCOUNTER — Telehealth: Payer: Self-pay | Admitting: Acute Care

## 2015-03-09 NOTE — Telephone Encounter (Signed)
I called and spoke with Leonard Massey about scheduling his 3 month follow up CT scan ( he had a Lung RADS 4 A scan on 12/19/14, recommendation for a 3 month follow up). He has requested that we do the scan 03/15/15. I am wanting to check that because this is technically not 3 months after the first scan it will still be paid for under the Lung Cancer Screening Program. We may need to schedule for the following week to ensure he does not get billed. I have told him I will call him and let him know as soon as I know.

## 2015-03-12 ENCOUNTER — Other Ambulatory Visit: Payer: Self-pay | Admitting: Acute Care

## 2015-03-12 DIAGNOSIS — Z87891 Personal history of nicotine dependence: Secondary | ICD-10-CM

## 2015-03-12 NOTE — Telephone Encounter (Signed)
Leonard Massey is scheduled for his 3 month follow up CT on 03/19/15 at 0900. I have spoken with both Leonard Massey and his wife, and they both verbalize understanding of when and where to be for the scan on 03/19/15. I have mailed them directions to the scanner at [address]. They do not have an email to allow me to get this information to them.

## 2015-03-15 ENCOUNTER — Telehealth: Payer: Self-pay | Admitting: Acute Care

## 2015-03-15 ENCOUNTER — Encounter: Payer: Self-pay | Admitting: Acute Care

## 2015-03-15 ENCOUNTER — Ambulatory Visit (INDEPENDENT_AMBULATORY_CARE_PROVIDER_SITE_OTHER): Payer: Medicare Other | Admitting: Internal Medicine

## 2015-03-15 VITALS — BP 118/82 | HR 70 | Temp 98.2°F | Ht 66.0 in | Wt 111.9 lb

## 2015-03-15 DIAGNOSIS — G458 Other transient cerebral ischemic attacks and related syndromes: Secondary | ICD-10-CM

## 2015-03-15 DIAGNOSIS — G894 Chronic pain syndrome: Secondary | ICD-10-CM

## 2015-03-15 DIAGNOSIS — E785 Hyperlipidemia, unspecified: Secondary | ICD-10-CM

## 2015-03-15 DIAGNOSIS — Z7901 Long term (current) use of anticoagulants: Secondary | ICD-10-CM | POA: Diagnosis not present

## 2015-03-15 DIAGNOSIS — K219 Gastro-esophageal reflux disease without esophagitis: Secondary | ICD-10-CM | POA: Diagnosis not present

## 2015-03-15 DIAGNOSIS — I480 Paroxysmal atrial fibrillation: Secondary | ICD-10-CM | POA: Diagnosis not present

## 2015-03-15 DIAGNOSIS — R9389 Abnormal findings on diagnostic imaging of other specified body structures: Secondary | ICD-10-CM

## 2015-03-15 DIAGNOSIS — Z0000 Encounter for general adult medical examination without abnormal findings: Secondary | ICD-10-CM

## 2015-03-15 DIAGNOSIS — F17210 Nicotine dependence, cigarettes, uncomplicated: Secondary | ICD-10-CM | POA: Diagnosis not present

## 2015-03-15 DIAGNOSIS — R634 Abnormal weight loss: Secondary | ICD-10-CM

## 2015-03-15 DIAGNOSIS — I10 Essential (primary) hypertension: Secondary | ICD-10-CM | POA: Diagnosis not present

## 2015-03-15 DIAGNOSIS — I739 Peripheral vascular disease, unspecified: Secondary | ICD-10-CM

## 2015-03-15 DIAGNOSIS — J449 Chronic obstructive pulmonary disease, unspecified: Secondary | ICD-10-CM

## 2015-03-15 DIAGNOSIS — I779 Disorder of arteries and arterioles, unspecified: Secondary | ICD-10-CM

## 2015-03-15 DIAGNOSIS — R938 Abnormal findings on diagnostic imaging of other specified body structures: Secondary | ICD-10-CM | POA: Diagnosis not present

## 2015-03-15 DIAGNOSIS — Z125 Encounter for screening for malignant neoplasm of prostate: Secondary | ICD-10-CM

## 2015-03-15 MED ORDER — HYDROCODONE-ACETAMINOPHEN 10-325 MG PO TABS: 1.0000 | ORAL_TABLET | Freq: Three times a day (TID) | ORAL | Status: DC | PRN

## 2015-03-15 MED ORDER — METOPROLOL SUCCINATE ER 100 MG PO TB24: 100.0000 mg | ORAL_TABLET | Freq: Every day | ORAL | Status: DC

## 2015-03-15 MED ORDER — APIXABAN 5 MG PO TABS: 5.0000 mg | ORAL_TABLET | Freq: Two times a day (BID) | ORAL | Status: DC

## 2015-03-15 MED ORDER — SIMVASTATIN 40 MG PO TABS: 40.0000 mg | ORAL_TABLET | Freq: Every day | ORAL | Status: DC

## 2015-03-15 NOTE — Telephone Encounter (Signed)
Opened in error

## 2015-03-15 NOTE — Progress Notes (Signed)
[facility] Subjective:   Patient ID: Leonard Massey male   DOB: 10/30/1945 69 y.o.   MRN: [id number]  HPI: [name] Yoss is a 68 y.o. male with a PMH detailed below who presents for routine follow up. He comes today with his sister [name].  [name] reports that he is doing well but he continues to have a slow loss of weight.  He reports he eats but does not have a great appetite.  His sister is very concerned about him.  He does note that in the interim he has had a PPM placed by [name] at [facility] but they are asking for a referral to [name] in [place] as this would make his commute for care much easier and they are familiar with [name].   For his carotid artery stenosis he has been seeing [name] at [facility]. He reports his next appointment is in about a week.  He would also like to see if this could be followed in [name].  I instructed it may be easiest if he attends this next appointment asks [name] for a referral to our [facility] service here in [place].  He continues to follow with [name]. For his COPD. He is using Breo and Albuterol.  He has not seen him since our last visit.  [name] reports he is continuing to use e-cigerettes.  His chronic back pain and neck pain which reports is doing about the same. He notes by taking 2 pills of norco tid he is able to do much of his daily activies without being in severe pain.  He reports he never takes more than 3 pills in a day.    Cardiologist: [name], [name]  Arrhythmia Specialist: [name] Pulmonologist: [name] Pulmonology  Gastroenterologist: [name]  Past Medical History  Diagnosis Date  . COPD (chronic obstructive pulmonary disease)   . Depression   . GERD (gastroesophageal reflux disease)     Pt reports having EGD/colonoscopy in 2010 by Dr. [name] in [place] wich showed ulcers on uppper and normal colon. No report in emr.   . Hyperlipidemia     Pt  typically has low HDL and low LDL.   [name] Hypertension   . Low back pain   . Cervical spondylosis   . Alcoholism   . Subclavian steal syndrome     HX of.   . Right knee pain 8/10    Began to complain of after requesting a scooter.  Exam was negative and it was decided that pt should continue to ambulate.    Current Outpatient Prescriptions  Medication Sig Dispense Refill  . albuterol (PROAIR HFA) 108 (90 BASE) MCG/ACT inhaler Inhale 1-2 puffs into the lungs every 6 (six) hours as needed for wheezing or shortness of breath.    [name] apixaban (ELIQUIS) 5 MG TABS tablet Take 1 tablet (5 mg total) by mouth 2 (two) times daily. 60 tablet 11  . fluticasone (FLONASE) 50 MCG/ACT nasal spray Place into the nose.    [name] Fluticasone Furoate-Vilanterol (BREO ELLIPTA) 100-25 MCG/INH AEPB Inhale 1 Inhaler into the lungs daily.    [name] HYDROcodone-acetaminophen (NORCO) 10-325 MG per tablet Take 1 tablet by mouth every 8 (eight) hours as needed for severe pain. 90 tablet 0  . metoprolol succinate (TOPROL XL) 100 MG 24 hr tablet Take 1 tablet (100 mg total) by mouth daily. Take with or immediately following a meal. 90 tablet 3  .  sertraline (ZOLOFT) 50 MG tablet TAKE 1 TABLET BY MOUTH DAILY 30 tablet 5  . simvastatin (ZOCOR) 40 MG tablet Take 1 tablet (40 mg total) by mouth at bedtime. 90 tablet 3   No current facility-administered medications for this visit.   No family history on file. History   Social History  . Marital Status: Divorced    Spouse Name: [name]  . Number of Children: [name]  . Years of Education: [name]   Social History Main Topics  . Smoking status: Current Some Day Smoker -- 1.50 packs/day for 50 years    Types: Cigarettes    Start date: 07/15/2013    Last Attempt to Quit: 08/14/2013  . Smokeless tobacco: Former NeurosurgeonUser    Quit date: 02/01/2012     Comment: Using E-cigs  . Alcohol Use: No  . Drug Use: Not on file  . Sexual Activity: Not on file   Other Topics Concern  . None   Social  History Narrative   Disabled several years, [place].   Lives with disabled girlfriend in a trailer.  Works around [employer] and keeps busy.    Review of Systems: Review of Systems  Constitutional: Negative for fever.  Eyes: Negative for blurred vision.  Gastrointestinal: Negative for heartburn and abdominal pain.  Genitourinary: Negative for dysuria, urgency, frequency and hematuria.  Musculoskeletal: Negative for myalgias.  Neurological: Negative for dizziness, tingling and tremors.     Objective:  Physical Exam: Filed Vitals:   03/15/15 1501 03/15/15 1554  BP: 171/93 118/82  Pulse: 70   Temp: 98.2 F (36.8 C)   TempSrc: Oral   Height: 5\' 6"  (1.676 m)   Weight: 111 lb 14.4 oz (50.758 kg)   SpO2: 100%    Physical Exam  Constitutional: He is well-developed, well-nourished, and in no distress.  thin  HENT:  Head: Normocephalic and atraumatic.  Eyes: Conjunctivae are normal. Pupils are equal, round, and reactive to light.  Cardiovascular: Normal rate.   No murmur heard. Pulmonary/Chest: Effort normal and breath sounds normal. He has no wheezes.  PPM palpable under skin in left upper chest  Abdominal: Soft. Bowel sounds are normal. There is no tenderness.  Musculoskeletal: He exhibits no edema.  Lymphadenopathy:    He has no cervical adenopathy.  Nursing note and vitals reviewed.    Wt Readings from Last 5 Encounters:  03/15/15 111 lb 14.4 oz (50.758 kg)  12/14/14 115 lb 12.8 oz (52.527 kg)  09/07/14 115 lb 3.2 oz (52.254 kg)  06/01/14 120 lb (54.432 kg)  01/12/14 133 lb 12.8 oz (60.691 kg)    Assessment & Plan:  Case discussed with Dr. Criselda PeachesMullen  Loss of weight - He continues a very slow loss of weight. -We are working up lung CA as a possible cause, he is due for a 3 month follow up CT of his lungs on Monday - He has not had a PSA checked, he denies any LUTS symptoms but this may be reasonable, I called him on 5/22 and discussed this with him, he is  willing to have a PSA checked. I asked him to stop by the clinic on 5/23 when he comes for his CT.   SUBCLAVIAN STEAL SYNDROME - Currently asymptomatic, has follow up with Dr Everardo BealsGeary next week.   Paroxysmal a-fib -  Continue Eliquis for A/C - Now has PPM, will D/C Coreg (has COPD) and add Metoprolol 100mg  XL daily - Placed referral per patient request to Dr. Johney FrameAllred  Essential hypertension - Initially hypertensive but manual repeat shows patient at goal. - Change Coreg to metoprolol   Carotid arterial disease - Has close follow up with Dr. Everardo BealsGeary, [place] [facility] [place] [facility] [place].   COPD, severe -Stable, changed coreg to metoprolol.   GERD No current heartburn, patient reports he is off Nexium. - Patient may continue off nexium. Will monitor.   Hyperlipidemia -Stable, continue simvastatin.   Chronic pain syndrome -He is doing well with his current dose of Norco. He has had no red flags.  Meds help him to tolerate his chronic neck pain to do daily chores and adls, he has been told he is not an operative candidate due to multiple comorbidities.   Abnormal screening CT of chest - His repeat CT scan is coming up on Monday.     Medications Ordered Meds ordered this encounter  Medications  . DISCONTD: HYDROcodone-acetaminophen (NORCO) 10-325 MG per tablet    Sig: Take 1 tablet by mouth every 8 (eight) hours as needed for severe pain.    Dispense:  90 tablet    Refill:  0    Rx 1/3  . DISCONTD: HYDROcodone-acetaminophen (NORCO) 10-325 MG per tablet    Sig: Take 1 tablet by mouth every 8 (eight) hours as needed for severe pain.    Dispense:  90 tablet    Refill:  0    Rx 2/3 may be filled 30 days from print date  . HYDROcodone-acetaminophen (NORCO) 10-325 MG per tablet    Sig: Take 1 tablet by mouth every 8 (eight) hours as needed for severe pain.    Dispense:  90 tablet    Refill:  0    Rx 3/3 may be filled 60 days from print date  .  apixaban (ELIQUIS) 5 MG TABS tablet    Sig: Take 1 tablet (5 mg total) by mouth 2 (two) times daily.    Dispense:  60 tablet    Refill:  11  . simvastatin (ZOCOR) 40 MG tablet    Sig: Take 1 tablet (40 mg total) by mouth at bedtime.    Dispense:  90 tablet    Refill:  3  . metoprolol succinate (TOPROL XL) 100 MG 24 hr tablet    Sig: Take 1 tablet (100 mg total) by mouth daily. Take with or immediately following a meal.    Dispense:  90 tablet    Refill:  3   Other Orders Orders Placed This Encounter  Procedures  . PSA    Standing Status: Future     Number of Occurrences:      Standing Expiration Date: 04/18/2015  . Ambulatory referral to Cardiology    Referral Priority:  Routine    Referral Type:  Consultation    Referral Reason:  Specialty Services Required    Requested Specialty:  Cardiology    Number of Visits Requested:  1

## 2015-03-15 NOTE — Patient Instructions (Addendum)
General Instructions: Stop Coreg, Start Metoprolol 100mg  XL daily  Thank you for bringing your medicines today. This helps us keep you safe from mistakes.   Progress Toward Treatment Goals:  Treatment Goal 12/14/2014  Blood pressure deteriorated  Stop smoking smoking less  Prevent falls -    Self Care Goals & Plans:  Self Care Goal 03/15/2015  Manage my medications take my medicines as prescribed; bring my medications to every visit; refill my medications on time  Monitor my health -  Eat healthy foods eat more vegetables; eat foods that are low in salt; eat baked foods instead of fried foods  Be physically active take a walk every day  Stop smoking go to the [employer] website ([employer].com.eewww.quitlin[employer].com); call [employer] (1-800-QUIT-NOW)  Meeting treatment goals -    No flowsheet data found.   Care Management & Community Referrals:  Referral 12/14/2014  Referrals made for care management support none needed  Referrals made to community resources -

## 2015-03-18 NOTE — Assessment & Plan Note (Signed)
-    Continue Eliquis for A/C - Now has PPM, will D/C Coreg (has COPD) and add Metoprolol 100mg  XL daily - Placed referral per patient request to Dr. Johney FrameAllred

## 2015-03-18 NOTE — Assessment & Plan Note (Signed)
-   Has close follow up with Dr. Everardo BealsGeary, [place] [facility] [place].

## 2015-03-18 NOTE — Assessment & Plan Note (Signed)
-   His repeat CT scan is coming up on Monday.

## 2015-03-18 NOTE — Assessment & Plan Note (Signed)
-   Currently asymptomatic, has follow up with Dr Everardo BealsGeary next week.

## 2015-03-18 NOTE — Assessment & Plan Note (Signed)
-   Initially hypertensive but manual repeat shows patient at goal. - Change Coreg to metoprolol

## 2015-03-18 NOTE — Assessment & Plan Note (Signed)
No current heartburn, patient reports he is off Nexium. - Patient may continue off nexium. Will monitor.

## 2015-03-18 NOTE — Assessment & Plan Note (Signed)
Stable, continue simvastatin 

## 2015-03-18 NOTE — Assessment & Plan Note (Signed)
-  Stable, changed coreg to metoprolol.

## 2015-03-18 NOTE — Assessment & Plan Note (Signed)
-  He is doing well with his current dose of Norco. He has had no red flags.  Meds help him to tolerate his chronic neck pain to do daily chores and adls, he has been told he is not an operative candidate due to multiple comorbidities.

## 2015-03-18 NOTE — Assessment & Plan Note (Signed)
-   He continues a very slow loss of weight. -We are working up lung CA as a possible cause, he is due for a 3 month follow up CT of his lungs on Monday - He has not had a PSA checked, he denies any LUTS symptoms but this may be reasonable, I called him on 5/22 and discussed this with him, he is willing to have a PSA checked. I asked him to stop by the clinic on 5/23 when he comes for his CT.

## 2015-03-19 ENCOUNTER — Other Ambulatory Visit (INDEPENDENT_AMBULATORY_CARE_PROVIDER_SITE_OTHER): Payer: Medicare Other

## 2015-03-19 ENCOUNTER — Ambulatory Visit (INDEPENDENT_AMBULATORY_CARE_PROVIDER_SITE_OTHER)
Admission: RE | Admit: 2015-03-19 | Discharge: 2015-03-19 | Disposition: A | Discharge status: Home / Self Care | Payer: Medicare Other | Source: Ambulatory Visit | Attending: Acute Care | Admitting: Acute Care

## 2015-03-19 ENCOUNTER — Telehealth: Payer: Self-pay | Admitting: *Deleted

## 2015-03-19 DIAGNOSIS — R634 Abnormal weight loss: Secondary | ICD-10-CM

## 2015-03-19 DIAGNOSIS — Z125 Encounter for screening for malignant neoplasm of prostate: Secondary | ICD-10-CM

## 2015-03-19 DIAGNOSIS — Z87891 Personal history of nicotine dependence: Secondary | ICD-10-CM

## 2015-03-19 DIAGNOSIS — Z0000 Encounter for general adult medical examination without abnormal findings: Secondary | ICD-10-CM

## 2015-03-19 NOTE — Telephone Encounter (Signed)
Pt presents for lab work and wants BP checked, states he continues to have the h/a's he was having at last appt, have not improved, have not gotten worse, had one yesterday. He states he feels fine otherwise. He is offered an appt and refuses. Please advise

## 2015-03-19 NOTE — Telephone Encounter (Signed)
bp 134/86 hr 78, took his meds at 0700 today

## 2015-03-19 NOTE — Telephone Encounter (Signed)
BP acceptable. Keep F/U appt

## 2015-03-19 NOTE — Telephone Encounter (Signed)
Thank you, I will follow up his PSA, and Lung CT will likely have him back for a follow up appointment in a few weeks but will determine based on these results.

## 2015-03-20 ENCOUNTER — Telehealth: Payer: Self-pay | Admitting: Acute Care

## 2015-03-20 LAB — PSA: PSA: 0.75 ng/mL (ref <=4.00)

## 2015-03-20 NOTE — Progress Notes (Signed)
Internal Medicine Clinic Attending  Case discussed with Dr. Hoffman soon after the resident saw the patient.  We reviewed the resident's history and exam and pertinent patient test results.  I agree with the assessment, diagnosis, and plan of care documented in the resident's note. 

## 2015-03-20 NOTE — Telephone Encounter (Signed)
The follow up low dose CT that Leonard Massey had done 03/19/15 resulted as a lung RADS 2. I have called Leonard Massey and let him know that his scan was stable, and he did not need another scan for 12 months. He verbalized understanding. He did not have any questions. He has my contact information if needed. I also messaged Leonard Massey with the results as he is this patient's PCP. Leonard Massey knows we will call him April of 2017 to get his annual scan scheduled.

## 2015-04-10 ENCOUNTER — Other Ambulatory Visit: Payer: Self-pay

## 2015-04-11 ENCOUNTER — Other Ambulatory Visit: Payer: Self-pay

## 2015-04-12 ENCOUNTER — Encounter: Payer: Self-pay | Admitting: Internal Medicine

## 2015-04-12 ENCOUNTER — Ambulatory Visit (INDEPENDENT_AMBULATORY_CARE_PROVIDER_SITE_OTHER): Payer: Medicare Other | Admitting: Internal Medicine

## 2015-04-12 VITALS — BP 122/84 | HR 71 | Ht 66.0 in | Wt 113.8 lb

## 2015-04-12 DIAGNOSIS — I442 Atrioventricular block, complete: Secondary | ICD-10-CM

## 2015-04-12 DIAGNOSIS — I4891 Unspecified atrial fibrillation: Secondary | ICD-10-CM

## 2015-04-12 DIAGNOSIS — I4821 Permanent atrial fibrillation: Secondary | ICD-10-CM

## 2015-04-12 DIAGNOSIS — I482 Chronic atrial fibrillation: Secondary | ICD-10-CM | POA: Diagnosis not present

## 2015-04-12 DIAGNOSIS — I10 Essential (primary) hypertension: Secondary | ICD-10-CM

## 2015-04-12 DIAGNOSIS — I779 Disorder of arteries and arterioles, unspecified: Secondary | ICD-10-CM

## 2015-04-12 DIAGNOSIS — I739 Peripheral vascular disease, unspecified: Secondary | ICD-10-CM

## 2015-04-12 LAB — CUP PACEART INCLINIC DEVICE CHECK
Brady Statistic RV Percent Paced: 99.86 %
Date Time Interrogation Session: 20160616135733
Lead Channel Impedance Value: 562.5 Ohm
Lead Channel Impedance Value: 887.5 Ohm
Lead Channel Pacing Threshold Amplitude: 0.625 V
Lead Channel Pacing Threshold Amplitude: 1 V
Lead Channel Pacing Threshold Pulse Width: 0.4 ms
Lead Channel Pacing Threshold Pulse Width: 0.4 ms
Lead Channel Setting Pacing Amplitude: 2 V
Lead Channel Setting Pacing Pulse Width: 0.4 ms
Lead Channel Setting Sensing Sensitivity: 4 mV
MDC IDC MSMT BATTERY REMAINING LONGEVITY: 108 mo
MDC IDC MSMT BATTERY VOLTAGE: 3.01 V
MDC IDC PG MODEL: 3242
MDC IDC SET LEADCHNL LV PACING AMPLITUDE: 2 V
MDC IDC SET LEADCHNL LV PACING PULSEWIDTH: 0.4 ms
MDC IDC STAT BRADY RA PERCENT PACED: 0 %
Pulse Gen Serial Number: 7714572

## 2015-04-12 NOTE — Patient Instructions (Addendum)
Medication Instructions:  Your physician recommends that you continue on your current medications as directed. Please refer to the Current Medication list given to you today.   Labwork: None ordered  Testing/Procedures: None ordered  Follow-Up: You have been referred to [facility] to establish for PVD   Your physician wants you to follow-up in: 6 months with Dr Johney Frame Bonita Quin will receive a reminder letter in the mail two months in advance. If you don't receive a letter, please call our office to schedule the follow-up appointment.  Remote monitoring is used to monitor your Pacemaker of ICD from home. This monitoring reduces the number of office visits required to check your device to one time per year. It allows [place] to keep an eye on the functioning of your device to ensure it is working properly. You are scheduled for a device check from home on 07/12/15. You may send your transmission at any time that day. If you have a wireless device, the transmission will be sent automatically. After your physician reviews your transmission, you will receive a postcard with your next transmission date.  ICM clinic with Sherri Rad, RN     Any Other Special Instructions Will Be Listed Below (If Applicable).

## 2015-04-12 NOTE — Progress Notes (Signed)
Electrophysiology Office Note   Date:  04/12/2015   ID:  Howell, Groesbeck 01-22-1946, MRN [id number]  PCP:  Gust Rung, DO  Cardiologist:  Previously at [facility] Primary Electrophysiologist: Hillis Range, MD    Chief Complaint  Patient presents with  . Atrial Fibrillation     History of Present Illness: Leonard Massey is a 69 y.o. male who presents today for electrophysiology evaluation.   He presents to establish in the EP device clinic.  He is a poor historian.  He has atrial fibrillation and a SJM BiV pacemaker which was implanted by Dr Bard Herbert at [facility] 3 months ago with concomitant AV nodal ablation.  He has a h/o heavy tobacco use and prior ETOh use.  He is primarily limited by DDD/DJD and has advanced COPD.  He also had PVD for which he is followed by vascular surgery.   Today, he denies symptoms of palpitations, chest pain,  orthopnea, PND, lower extremity edema, claudication, dizziness, presyncope, syncope, bleeding, or neurologic sequela. The patient is tolerating medications without difficulties and is otherwise without complaint today.    Past Medical History  Diagnosis Date  . COPD (chronic obstructive pulmonary disease)   . Depression   . GERD (gastroesophageal reflux disease)     Pt reports having EGD/colonoscopy in 2010 by Dr. [name] in [place] wich showed ulcers on uppper and normal colon. No report in emr.   . Hyperlipidemia     Pt typically has low HDL and low LDL.   [name] Hypertension   . Low back pain   . Cervical spondylosis   . Alcoholism   . Subclavian steal syndrome     HX of.   . Right knee pain 8/10    Began to complain of after requesting a scooter.  Exam was negative and it was decided that pt should continue to ambulate.   . Permanent atrial fibrillation     chads2vasc score is at least 2, on eliquis  . Complete heart block     s/p AV nodal ablation   Past Surgical History  Procedure Laterality Date  . Carotid endartectomy Left   . Left  common carotid to subclavian artery bypass  04/1999  . Anterior cervical diskectomy and fusion at c4-5 and c5-6  2002    Dr. Wynetta Emery  . Pacemaker insertion  01/04/2015    SJM PM 3242 CRT-P implanted by Dr Sharol Harness at [facility] for AV block s/p AV nodal ablation  . Av nodal ablation  3/16    At [facility]     Current Outpatient Prescriptions  Medication Sig Dispense Refill  . albuterol (PROAIR HFA) 108 (90 BASE) MCG/ACT inhaler Inhale 1-2 puffs into the lungs every 6 (six) hours as needed for wheezing or shortness of breath.    [name] apixaban (ELIQUIS) 5 MG TABS tablet Take 1 tablet (5 mg total) by mouth 2 (two) times daily. 60 tablet 11  . HYDROcodone-acetaminophen (NORCO) 10-325 MG per tablet Take 1 tablet by mouth every 8 (eight) hours as needed for severe pain. 90 tablet 0  . ipratropium-albuterol (DUONEB) 0.5-2.5 (3) MG/3ML SOLN Inhale the contents of 1 vial by nebulization four times a day  3  . metoprolol succinate (TOPROL XL) 100 MG 24 hr tablet Take 1 tablet (100 mg total) by mouth daily. Take with or immediately following a meal. 90 tablet 3  . sertraline (ZOLOFT) 50 MG tablet TAKE 1 TABLET BY MOUTH DAILY 30 tablet 5  . simvastatin (ZOCOR)  40 MG tablet Take 1 tablet (40 mg total) by mouth at bedtime. 90 tablet 3   No current facility-administered medications for this visit.    Allergies:   Aspirin   Social History:  The patient  reports that he has been smoking Cigarettes.  He started smoking about 20 months ago. He has a 75 pack-year smoking history. He quit smokeless tobacco use about 3 years ago. He reports that he does not drink alcohol.   Family History:  The patient's  family history includes Heart failure in his mother; Hypertension in his sister.    ROS:  Please see the history of present illness.   All other systems are reviewed and negative.    PHYSICAL EXAM: VS:  BP 122/84 mmHg  Pulse 71  Ht 5\' 6"  (1.676 m)  Wt 51.619 kg (113 lb 12.8 oz)  BMI 18.38 kg/m2 , BMI  Body mass index is 18.38 kg/(m^2). GEN: thin and chronically ill, in no acute distress HEENT: normal Neck: no JVD, carotid bruits, or masses Cardiac: RRR (Paced) Respiratory:  clear to auscultation bilaterally, normal work of breathing GI: soft, nontender, nondistended, + BS MS: no deformity or atrophy Skin: warm and dry  Neuro:  Strength and sensation are intact Psych: euthymic mood, full affect  EKG:  EKG is ordered today. The ekg ordered today shows afib, V pacing at 70 bpm with QRS 128 msec   Recent Labs: 09/07/2014: BUN 18; Creat 0.68; Hemoglobin 13.8; Platelets 265; Potassium 4.9; Sodium 141; TSH 1.261    Lipid Panel     Component Value Date/Time   CHOL 129 09/07/2014 1421   TRIG 83 09/07/2014 1421   HDL 48 09/07/2014 1421   CHOLHDL 2.7 09/07/2014 1421   VLDL 17 09/07/2014 1421   LDLCALC 64 09/07/2014 1421     Wt Readings from Last 3 Encounters:  04/12/15 51.619 kg (113 lb 12.8 oz)  03/15/15 50.758 kg (111 lb 14.4 oz)  12/14/14 52.527 kg (115 lb 12.8 oz)      Other studies Reviewed: Additional studies/ records that were reviewed today include: records from [facility] reviewed by Care Everywhere  Review of the above records today demonstrates: as above,  In addition, echo 12/08/14 reveals EF 55-60%, moderate MR, moderate LA dilatation, apical hypertrophic CM variant   ASSESSMENT AND PLAN:  1.  Permanent afib S/p AV nodal ablation Continue long term anticoagulation with eliquis (chads2vasc score is at least 3)  2. CHB S/p BiV pacemaker Normal pacemaker function See Pace Art report No changes today  3. HTN Stable No change required today  4. PVD Significant PVD per records from [facility], he has been encouraged to establish with vascular surgery I will therefore place referal today  Merlin Return to see me in 6 months Will enroll in [facility] Device clinic  Current medicines are reviewed at length with the patient today.   The patient does not have concerns  regarding his medicines.  The following changes were made today:  none  Labs/ tests ordered today include:  Orders Placed This Encounter  Procedures  . Ambulatory referral to Vascular Surgery  . Implantable device check  . EKG 12-Lead     Signed, Hillis Range, MD  04/12/2015 10:09 PM     [facility] [address] [place] [address] [phone] (office) [phone] (fax)

## 2015-04-23 ENCOUNTER — Telehealth: Payer: Self-pay | Admitting: Internal Medicine

## 2015-04-23 NOTE — Telephone Encounter (Signed)
[facility]

## 2015-05-07 ENCOUNTER — Encounter: Payer: Self-pay | Admitting: Internal Medicine

## 2015-05-07 ENCOUNTER — Other Ambulatory Visit: Payer: Self-pay

## 2015-05-07 DIAGNOSIS — I6522 Occlusion and stenosis of left carotid artery: Secondary | ICD-10-CM

## 2015-05-14 ENCOUNTER — Ambulatory Visit (INDEPENDENT_AMBULATORY_CARE_PROVIDER_SITE_OTHER): Payer: Medicare Other | Admitting: *Deleted

## 2015-05-14 ENCOUNTER — Encounter: Payer: Self-pay | Admitting: *Deleted

## 2015-05-14 DIAGNOSIS — I482 Chronic atrial fibrillation: Secondary | ICD-10-CM

## 2015-05-14 DIAGNOSIS — Z950 Presence of cardiac pacemaker: Secondary | ICD-10-CM | POA: Diagnosis not present

## 2015-05-14 DIAGNOSIS — I442 Atrioventricular block, complete: Secondary | ICD-10-CM | POA: Diagnosis not present

## 2015-05-14 DIAGNOSIS — I4821 Permanent atrial fibrillation: Secondary | ICD-10-CM

## 2015-05-14 NOTE — Progress Notes (Signed)
EPIC Encounter for ICM Monitoring  Patient Name: Leonard Massey is a 69 y.o. male Date: 05/14/2015 Primary Care Physican: Gust RungHoffman, Erik C, DO Primary Cardiologist: Allred Electrophysiologist: Allred Dry Weight: 115 lbs  Bi-V pacing: >99%       In the past month, have you:  1. Gained more than 2 pounds in a day or more than 5 pounds in a week? no  2. Had changes in your medications (with verification of current medications)? no  3. Had more shortness of breath than is usual for you? no  4. Limited your activity because of shortness of breath? no  5. Not been able to sleep because of shortness of breath? no  6. Had increased swelling in your feet or ankles? no  7. Had symptoms of dehydration (dizziness, dry mouth, increased thirst, decreased urine output) no  8. Had changes in sodium restriction? no  9. Been compliant with medication? Yes   ICM trend:   Follow-up plan: ICM clinic phone appointment: 06/14/15. This is my first ICM encounter with the patient. He saw Dr. Johney FrameAllred on 04/12/15. Since that time his corvue readings have shown some elevation from ~ 6/14-7/2. The patient denies any change in his symptoms or weight. He denies any extra sodium/ fluid intake. He is currently back to baseline, although his baseline has shown a downward shift over the last 4-6 weeks. No changes made today.   Copy of note sent to patient's primary care physician, primary cardiologist, and device following physician.  Sherri RadHeather McGhee, RN, BSN 05/14/2015 11:41 AM

## 2015-06-14 ENCOUNTER — Ambulatory Visit (INDEPENDENT_AMBULATORY_CARE_PROVIDER_SITE_OTHER): Payer: Medicare Other | Admitting: Internal Medicine

## 2015-06-14 ENCOUNTER — Encounter: Payer: Self-pay | Admitting: Internal Medicine

## 2015-06-14 ENCOUNTER — Ambulatory Visit (INDEPENDENT_AMBULATORY_CARE_PROVIDER_SITE_OTHER): Payer: Medicare Other | Admitting: *Deleted

## 2015-06-14 VITALS — BP 191/77 | HR 89 | Temp 98.5°F | Ht 66.0 in | Wt 120.3 lb

## 2015-06-14 DIAGNOSIS — Z7901 Long term (current) use of anticoagulants: Secondary | ICD-10-CM | POA: Diagnosis not present

## 2015-06-14 DIAGNOSIS — I4821 Permanent atrial fibrillation: Secondary | ICD-10-CM

## 2015-06-14 DIAGNOSIS — Z95810 Presence of automatic (implantable) cardiac defibrillator: Secondary | ICD-10-CM | POA: Diagnosis not present

## 2015-06-14 DIAGNOSIS — R634 Abnormal weight loss: Secondary | ICD-10-CM

## 2015-06-14 DIAGNOSIS — G894 Chronic pain syndrome: Secondary | ICD-10-CM

## 2015-06-14 DIAGNOSIS — I10 Essential (primary) hypertension: Secondary | ICD-10-CM

## 2015-06-14 DIAGNOSIS — H9211 Otorrhea, right ear: Secondary | ICD-10-CM

## 2015-06-14 DIAGNOSIS — F17200 Nicotine dependence, unspecified, uncomplicated: Secondary | ICD-10-CM

## 2015-06-14 DIAGNOSIS — I482 Chronic atrial fibrillation: Secondary | ICD-10-CM | POA: Diagnosis not present

## 2015-06-14 DIAGNOSIS — F17210 Nicotine dependence, cigarettes, uncomplicated: Secondary | ICD-10-CM

## 2015-06-14 MED ORDER — NICOTINE POLACRILEX 2 MG MT LOZG
2.0000 mg | LOZENGE | OROMUCOSAL | Status: DC | PRN
Start: 2015-06-14 — End: 2015-07-05

## 2015-06-14 MED ORDER — NICOTINE 7 MG/24HR TD PT24: 7.0000 mg | MEDICATED_PATCH | TRANSDERMAL | Status: DC

## 2015-06-14 MED ORDER — HYDROCODONE-ACETAMINOPHEN 10-325 MG PO TABS: 1.0000 | ORAL_TABLET | Freq: Three times a day (TID) | ORAL | Status: DC | PRN

## 2015-06-14 MED ORDER — NICOTINE 14 MG/24HR TD PT24: 14.0000 mg | MEDICATED_PATCH | TRANSDERMAL | Status: AC

## 2015-06-14 NOTE — Progress Notes (Signed)
[facility] Subjective:   Patient ID: Leonard Massey male   DOB: 02/09/1946 69 y.o.   MRN: [id number]  HPI: [name] is a 69 y.o. male with a PMH detailed below who presents for routeine follow up for his multiple medical problems.    He does report he has had some clear right ear drainage for the last 2 days.  He notes that many years go he had some sort of tube placed in that ear but has not had any other problems.  He denies any pain, fever, chills or hearing loss.  He has received his zoster vaccine and has not appreciated any rash.  Please see A&P below for further HPI details of chronic medical issues.  Past Medical History  Diagnosis Date  . COPD (chronic obstructive pulmonary disease)   . Depression   . GERD (gastroesophageal reflux disease)     Pt reports having EGD/colonoscopy in 2010 by Dr. [name] in [place] wich showed ulcers on uppper and normal colon. No report in emr.   . Hyperlipidemia     Pt typically has low HDL and low LDL.   [name] Hypertension   . Low back pain   . Cervical spondylosis   . Alcoholism   . Subclavian steal syndrome     HX of.   . Right knee pain 8/10    Began to complain of after requesting a scooter.  Exam was negative and it was decided that pt should continue to ambulate.   . Permanent atrial fibrillation     chads2vasc score is at least 2, on eliquis  . Complete heart block     s/p AV nodal ablation   Current Outpatient Prescriptions  Medication Sig Dispense Refill  . albuterol (PROAIR HFA) 108 (90 BASE) MCG/ACT inhaler Inhale 1-2 puffs into the lungs every 6 (six) hours as needed for wheezing or shortness of breath.    [name] apixaban (ELIQUIS) 5 MG TABS tablet Take 1 tablet (5 mg total) by mouth 2 (two) times daily. 60 tablet 11  . HYDROcodone-acetaminophen (NORCO) 10-325 MG per tablet Take 1 tablet by mouth every 8 (eight) hours as needed for severe pain. 90 tablet 0  . ipratropium-albuterol (DUONEB) 0.5-2.5 (3) MG/3ML  SOLN Inhale the contents of 1 vial by nebulization four times a day  3  . metoprolol succinate (TOPROL XL) 100 MG 24 hr tablet Take 1 tablet (100 mg total) by mouth daily. Take with or immediately following a meal. 90 tablet 3  . nicotine (NICODERM CQ - DOSED IN MG/24 HOURS) 14 mg/24hr patch Place 1 patch (14 mg total) onto the skin daily. 14 patch 0  . [START ON 06/29/2015] nicotine (NICODERM CQ - DOSED IN MG/24 HR) 7 mg/24hr patch Place 1 patch (7 mg total) onto the skin daily. 14 patch 0  . nicotine polacrilex (EQL NICOTINE POLACRILEX) 2 MG lozenge Take 1 lozenge (2 mg total) by mouth as needed for smoking cessation. 100 tablet 0  . sertraline (ZOLOFT) 50 MG tablet TAKE 1 TABLET BY MOUTH DAILY 30 tablet 5  . simvastatin (ZOCOR) 40 MG tablet Take 1 tablet (40 mg total) by mouth at bedtime. 90 tablet 3   No current facility-administered medications for this visit.   Family History  Problem Relation Age of Onset  . Heart failure Mother   . Hypertension Sister    Social History   Social History  . Marital Status: Divorced    Spouse Name: [name]  .  Number of Children: [name]  . Years of Education: [name]   Social History Main Topics  . Smoking status: Current Some Day Smoker -- 1.50 packs/day for 50 years    Types: Cigarettes    Start date: 07/15/2013    Last Attempt to Quit: 08/14/2013  . Smokeless tobacco: Former Neurosurgeon    Quit date: 02/01/2012     Comment: Using E-cigs. pt smoking about 3 cigarettes a month 04-12-15  . Alcohol Use: No  . Drug Use: None  . Sexual Activity: Not Asked   Other Topics Concern  . None   Social History Narrative   Disabled several years, On [place] and [place].   Lives with disabled girlfriend in a trailer [place].  Works around [employer] and keeps busy.    Review of Systems: Review of Systems  Constitutional: Negative for fever, chills, weight loss and malaise/fatigue.  HENT: Positive for ear discharge. Negative for ear pain, hearing loss and tinnitus.    Eyes: Negative for blurred vision.  Respiratory: Negative for cough and shortness of breath.   Cardiovascular: Negative for chest pain.  Gastrointestinal: Negative for heartburn.  Genitourinary: Negative for dysuria.  Musculoskeletal: Negative for myalgias.  Skin: Negative for rash.  Neurological: Negative for dizziness and headaches.     Objective:  Physical Exam: Filed Vitals:   06/14/15 1410  BP: 191/77  Pulse: 89  Temp: 98.5 F (36.9 C)  TempSrc: Oral  Height: 5\' 6"  (1.676 m)  Weight: 120 lb 4.8 oz (54.568 kg)  SpO2: 96%  Physical Exam  Constitutional: He is oriented to person, place, and time.  thin  HENT:  Head: Normocephalic and atraumatic.  Right Ear: Hearing and tympanic membrane normal. No drainage.  Left Ear: Hearing, tympanic membrane, external ear and ear canal normal. No drainage.  At the superior portion of his right ear canal there is a small clear vesicle that does not appear to be actively draining.  No other signs of erythema or vesicle in the ear, face or neck.  Eyes: Conjunctivae are normal.  Cardiovascular: Normal heart sounds.   Pulmonary/Chest: Effort normal and breath sounds normal.  Abdominal: Soft. Bowel sounds are normal.  Musculoskeletal: He exhibits no edema.  Neurological: He is alert and oriented to person, place, and time.    Assessment & Plan:  Case discussed with Dr. Cyndie Chime  Essential hypertension BP has been well controlled, today his initial reading is elevated.  He has been taking his medication and I suspect the cuff taking the BP may have been too large.  In fact I tried to retest is BP manually in the room but our normal adult BP cuff was too large.  We likely will need a pediatric cuff in the future to check his BP.  I could not find one today. -Will continue his current antihypertensive regimen.   Permanent atrial fibrillation He remains rate controlled with Troprol XL 100mg  daily and on A/C with eliquis 5mg  BID.  He has  now established with Dr Johney Frame.  Tobacco use disorder Unfourtuantely Mr Stogsdill reports he has started smoking again.  He is currently only smoking a few cigerettes a day but would like help quitting.  He reports the low dose nicotine patch did nothing when he last tried it. - Nicotine patch x2 weeks then for 2 weeks - Nicotine losange PRN  Chronic pain syndrome -He is prescribed Norco for chronic neck pain (not good candidate nor dose he desire further surgical intervention).  He reports  norco does help him to be function around the house with his adls.  He has not had any red flag behavior. - Refill current Norco Rx, 3 months given today.  Loss of weight - Our extensive investigation did not reveal any concerned malignancy.  He does now report his appetite has picked up some and his weight now appears to be trending in the right direction>>> he is up to 120 lbs.  Ear drainage right Overall his exam is reassuring, he does have a small vesicle that is probably the likely source of his drainage and it may be that this is resolving,  I do not see any concerning features in his exam.  I did have Dr Cyndie Chime come in and examine him too.  We reassured him that this will likely resolve with time.    Medications Ordered Meds ordered this encounter  Medications  . nicotine (NICODERM CQ - DOSED IN MG/24 HOURS) 14 mg/24hr patch    Sig: Place 1 patch (14 mg total) onto the skin daily.    Dispense:  14 patch    Refill:  0  . nicotine (NICODERM CQ - DOSED IN MG/24 HR) 7 mg/24hr patch    Sig: Place 1 patch (7 mg total) onto the skin daily.    Dispense:  14 patch    Refill:  0  . nicotine polacrilex (EQL NICOTINE POLACRILEX) 2 MG lozenge    Sig: Take 1 lozenge (2 mg total) by mouth as needed for smoking cessation.    Dispense:  100 tablet    Refill:  0  . DISCONTD: HYDROcodone-acetaminophen (NORCO) 10-325 MG per tablet    Sig: Take 1 tablet by mouth every 8 (eight) hours as needed for  severe pain.    Dispense:  90 tablet    Refill:  0    Rx 1/3  . DISCONTD: HYDROcodone-acetaminophen (NORCO) 10-325 MG per tablet    Sig: Take 1 tablet by mouth every 8 (eight) hours as needed for severe pain.    Dispense:  90 tablet    Refill:  0    Rx 2/3 may be filled 30 days from print date  . HYDROcodone-acetaminophen (NORCO) 10-325 MG per tablet    Sig: Take 1 tablet by mouth every 8 (eight) hours as needed for severe pain.    Dispense:  90 tablet    Refill:  0    Rx 3/3 may be filled 60 days from print date   Other Orders No orders of the defined types were placed in this encounter.   Follow Up: No Follow-up on file.

## 2015-06-15 NOTE — Progress Notes (Signed)
EPIC Encounter for ICM Monitoring  Patient Name: Leonard Massey is a 69 y.o. male Date: 06/15/2015 Primary Care Physican: Gust Rung, DO Primary Cardiologist: Allred Electrophysiologist: Allred Dry Weight: 120 lbs  Bi-V Pacing >99%       In the past month, have you:  1. Gained more than 2 pounds in a day or more than 5 pounds in a week? No but he has gained 5lbs since last ICM call in July 2016. Education given to call for any further weight gain or other symptoms.   2. Had changes in your medications (with verification of current medications)? no  3. Had more shortness of breath than is usual for you? no  4. Limited your activity because of shortness of breath? no  5. Not been able to sleep because of shortness of breath? no  6. Had increased swelling in your feet or ankles? Yes, slight swelling in ankles.    7. Had symptoms of dehydration (dizziness, dry mouth, increased thirst, decreased urine output) no  8. Had changes in sodium restriction? no  9. Been compliant with medication? Yes   ICM trend:   Follow-up plan: ICM clinic phone appointment 07/19/2015. Patient reported no changes in breathing and his normal is shortness of breath when walking.  He is having difficulty sleeping but not due to breathing difficulties.  He discussed sleep study with Dr Mikey Bussing on 06/14/2015 but he is not ready to schedule the test. He is sleeping about 1 to 1 1/2 hrs a night.   Education given sleep apnea may negatively affect the heart and CPAP treatment can improve his sleep.  Corvue report showed impedance slightly below baseline 06/09/2015 - 06/10/2015 and slight elevation 06/11/2015 - 06/13/2015 and he has some slight swelling of ankle.    Copy of note sent to patient's primary care physician, primary cardiologist, and device following physician.  [name], RN, CCM 06/15/2015 10:21 AM

## 2015-06-17 DIAGNOSIS — H9211 Otorrhea, right ear: Secondary | ICD-10-CM | POA: Insufficient documentation

## 2015-06-17 NOTE — Assessment & Plan Note (Signed)
BP has been well controlled, today his initial reading is elevated.  He has been taking his medication and I suspect the cuff taking the BP may have been too large.  In fact I tried to retest is BP manually in the room but our normal adult BP cuff was too large.  We likely will need a pediatric cuff in the future to check his BP.  I could not find one today. -Will continue his current antihypertensive regimen.

## 2015-06-17 NOTE — Assessment & Plan Note (Signed)
-   Our extensive investigation did not reveal any concerned malignancy.  He does now report his appetite has picked up some and his weight now appears to be trending in the right direction>>> he is up to 120 lbs.

## 2015-06-17 NOTE — Assessment & Plan Note (Signed)
He remains rate controlled with Troprol XL  daily and on A/C with eliquis  BID.  He has now established with Dr Johney Frame.

## 2015-06-17 NOTE — Assessment & Plan Note (Signed)
Unfourtuantely Leonard Massey reports he has started smoking again.  He is currently only smoking a few cigerettes a day but would like help quitting.  He reports the low dose nicotine patch did nothing when he last tried it. - Nicotine patch x2 weeks then for 2 weeks - Nicotine losange PRN

## 2015-06-17 NOTE — Assessment & Plan Note (Signed)
-  He is prescribed Norco for chronic neck pain (not good candidate nor dose he desire further surgical intervention).  He reports norco does help him to be function around the house with his adls.  He has not had any red flag behavior. - Refill current Norco Rx, 3 months given today.

## 2015-06-17 NOTE — Assessment & Plan Note (Signed)
Overall his exam is reassuring, he does have a small vesicle that is probably the likely source of his drainage and it may be that this is resolving,  I do not see any concerning features in his exam.  I did have Dr Cyndie Chime come in and examine him too.  We reassured him that this will likely resolve with time.

## 2015-06-18 ENCOUNTER — Encounter: Payer: Self-pay | Admitting: Vascular Surgery

## 2015-06-18 NOTE — Progress Notes (Signed)
Medicine attending: Medical history, presenting problems, physical findings, and medications, reviewed with Dr Erik Hoffman on the day of the patient visit and I concur with his evaluation and management plan. 

## 2015-06-19 ENCOUNTER — Ambulatory Visit (HOSPITAL_COMMUNITY)
Admission: RE | Admit: 2015-06-19 | Discharge: 2015-06-19 | Disposition: A | Discharge status: Home / Self Care | Payer: Medicare Other | Source: Ambulatory Visit | Attending: Vascular Surgery | Admitting: Vascular Surgery

## 2015-06-19 ENCOUNTER — Ambulatory Visit (INDEPENDENT_AMBULATORY_CARE_PROVIDER_SITE_OTHER): Payer: Medicare Other | Admitting: Vascular Surgery

## 2015-06-19 ENCOUNTER — Encounter: Payer: Self-pay | Admitting: Vascular Surgery

## 2015-06-19 VITALS — BP 118/84 | HR 89 | Temp 98.4°F | Resp 16 | Ht 66.0 in | Wt 113.0 lb

## 2015-06-19 DIAGNOSIS — T82858D Stenosis of vascular prosthetic devices, implants and grafts, subsequent encounter: Secondary | ICD-10-CM | POA: Diagnosis not present

## 2015-06-19 DIAGNOSIS — I6522 Occlusion and stenosis of left carotid artery: Secondary | ICD-10-CM

## 2015-06-19 DIAGNOSIS — I739 Peripheral vascular disease, unspecified: Secondary | ICD-10-CM | POA: Diagnosis not present

## 2015-06-19 NOTE — Addendum Note (Signed)
Addended by: Adria Dill L on: 06/19/2015 02:22 PM   Modules accepted: Orders

## 2015-06-19 NOTE — Addendum Note (Signed)
Addended by: Adria Dill L on: 06/19/2015 02:37 PM   Modules accepted: Orders

## 2015-06-19 NOTE — Progress Notes (Signed)
Subjective:     Patient ID: Leonard Massey, male   DOB: September 13, 1946, 69 y.o.   MRN: [id number]  HPI This 69 year old male has a remote history of a carotid subclavian bypass graft he says performed in [place] about 30 years ago but does not know the physician. He has recently been followed at [facility] by [facility]. The patient denies any active neurologic symptoms or claudication symptoms in the left upper extremity. Patient was having left arm symptoms prior to the first surgery. Patient lives in [place]  and has requested follow-up in [place] rather than at [facility]. Patient had pacemaker inserted in the last year at [facility] and is chronically anticoagulated on Coumadin for history of A. Fib. Patient also relates a history of bilateral calf claudication at about one half block. No history of rest pain and nonhealing ulcers infection or gangrene. Patient denies lateralizing weakness, aphasia, amaurosis fugax, diplopia, blurred vision, or syncope.   Past Medical History  Diagnosis Date  . COPD (chronic obstructive pulmonary disease)   . Depression   . GERD (gastroesophageal reflux disease)     Pt reports having EGD/colonoscopy in 2010 by Dr. [name] in [place] wich showed ulcers on uppper and normal colon. No report in emr.   . Hyperlipidemia     Pt typically has low HDL and low LDL.   [name] Hypertension   . Low back pain   . Cervical spondylosis   . Alcoholism   . Subclavian steal syndrome     HX of.   . Right knee pain 8/10    Began to complain of after requesting a scooter.  Exam was negative and it was decided that pt should continue to ambulate.   . Permanent atrial fibrillation     chads2vasc score is at least 2, on eliquis  . Complete heart block     s/p AV nodal ablation    Social History  Substance Use Topics  . Smoking status: Current Some Day Smoker -- 1.50 packs/day for 50 years    Types: Cigarettes    Start date: 07/15/2013    Last Attempt to Quit:  08/14/2013  . Smokeless tobacco: Former Neurosurgeon    Quit date: 02/01/2012     Comment: Using E-cigs. pt smoking about 3 cigarettes a month 04-12-15  . Alcohol Use: No    Family History  Problem Relation Age of Onset  . Heart failure Mother   . Hypertension Sister     Allergies  Allergen Reactions  . Aspirin     REACTION: He denies anything more than mild gastric upset when he takes uncoated ASA     Current outpatient prescriptions:  .  albuterol (PROAIR HFA) 108 (90 BASE) MCG/ACT inhaler, Inhale 1-2 puffs into the lungs every 6 (six) hours as needed for wheezing or shortness of breath., Disp: , Rfl:  .  apixaban (ELIQUIS) 5 MG TABS tablet, Take 1 tablet (5 mg total) by mouth 2 (two) times daily., Disp: 60 tablet, Rfl: 11 .  HYDROcodone-acetaminophen (NORCO) 10-325 MG per tablet, Take 1 tablet by mouth every 8 (eight) hours as needed for severe pain., Disp: 90 tablet, Rfl: 0 .  ipratropium-albuterol (DUONEB) 0.5-2.5 (3) MG/3ML SOLN, Inhale the contents of 1 vial by nebulization four times a day, Disp: , Rfl: 3 .  metoprolol succinate (TOPROL XL) 100 MG 24 hr tablet, Take 1 tablet (100 mg total) by mouth daily. Take with or immediately following a meal., Disp: 90 tablet, Rfl: 3 .  nicotine (NICODERM CQ - DOSED IN MG/24 HOURS) 14 mg/24hr patch, Place 1 patch (14 mg total) onto the skin daily., Disp: 14 patch, Rfl: 0 .  [START ON 06/29/2015] nicotine (NICODERM CQ - DOSED IN MG/24 HR) 7 mg/24hr patch, Place 1 patch (7 mg total) onto the skin daily., Disp: 14 patch, Rfl: 0 .  nicotine polacrilex (EQL NICOTINE POLACRILEX) 2 MG lozenge, Take 1 lozenge (2 mg total) by mouth as needed for smoking cessation., Disp: 100 tablet, Rfl: 0 .  sertraline (ZOLOFT) 50 MG tablet, TAKE 1 TABLET BY MOUTH DAILY, Disp: 30 tablet, Rfl: 5 .  simvastatin (ZOCOR) 40 MG tablet, Take 1 tablet (40 mg total) by mouth at bedtime. (Patient not taking: Reported on 06/19/2015), Disp: 90 tablet, Rfl: 3  Filed Vitals:   06/19/15  1233  BP: 118/84  Pulse: 89  Temp: 98.4 F (36.9 C)  Resp: 16  Height: 5\' 6"  (1.676 m)  Weight: 113 lb (51.256 kg)  SpO2: 100%    Body mass index is 18.25 kg/(m^2).           Review of Systems Patient has history of ablation procedure for heart block, has A. Fib, has wheezing, productive cough, COPD, history of depression , GERD. Patient has chronic lumbar spine problems with back discomfort. Other systems negative and complete review of systems     Objective:   Physical Exam BP 118/84 mmHg  Pulse 89  Temp(Src) 98.4 F (36.9 C)  Resp 16  Ht 5\' 6"  (1.676 m)  Wt 113 lb (51.256 kg)  BMI 18.25 kg/m2  SpO2 100%  Gen.-alert and oriented x3 in no apparent distress HEENT normal for age Lungs no rhonchi or wheezing Cardiovascular regular rhythm no murmurs carotid pulses 3+ palpable  , bruit over right carotid and left carotid areas. -pacemaker in left infraclavicular area palpable Abdomen soft nontender no palpable masses Musculoskeletal free of  major deformities Skin clear -no rashes Neurologic normal Lower extremities  2+ femoral pulses palpable bilaterally. No popliteal or distal pulses. No evidence of ischemia ulceration or gangrene  Upper extremities with 3+ radial pulse on the right and 2+ radial pulse on the left. Both hands well perfused.    I reviewed the records supplied by [facility] which indicated that the flow had reversed in the carotid to subclavian bypass and now is in the direction of the subclavian to carotid artery. There is a known stenosis at the origin of the common carotid artery on the left.   Today I ordered a duplex scan of the head and neck area. There is no evidence of right arotid stenosis. There is no evidence of left internal carotid stenosis. Flow in the subclavian carotid bypass does go in the direction of subclavian to carotid with damp velocities throughout the distal common carotid. There is  Greater than 30 mm difference in blood  pressures right heart and left.      Assessment:      history of subclavian steal  Syndrome approximately 30 years ago treated by carotid-subclavian bypass. Patient now has proximal common carotid stenosis on the left and flow has reversed and now is in the direction of subclavian to carotid. Slightly dampened flow distally in the left carotid system. Asymptomatic.  A. 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  no upper extremity claudication or neurologic symptoms are noted  patient will return in 6 months with CT angiogram of the head and neck area to further delineate this unless he develops symptoms in the interim  Will not plan treatment of his lower extremity claudication at this time since it is stable and is not limb threatening

## 2015-06-21 ENCOUNTER — Other Ambulatory Visit: Payer: Self-pay | Admitting: Internal Medicine

## 2015-06-29 ENCOUNTER — Other Ambulatory Visit: Payer: Self-pay | Admitting: Cardiovascular Disease

## 2015-06-29 ENCOUNTER — Encounter: Payer: Self-pay | Admitting: Internal Medicine

## 2015-06-29 ENCOUNTER — Telehealth: Payer: Self-pay | Admitting: Internal Medicine

## 2015-06-29 DIAGNOSIS — K5730 Diverticulosis of large intestine without perforation or abscess without bleeding: Secondary | ICD-10-CM | POA: Insufficient documentation

## 2015-06-29 DIAGNOSIS — R600 Localized edema: Secondary | ICD-10-CM

## 2015-06-29 MED ORDER — FUROSEMIDE 20 MG PO TABS: ORAL_TABLET | ORAL | Status: DC

## 2015-06-29 NOTE — Telephone Encounter (Signed)
Patient left voice mail message requesting return call.  Spoke with patient. He reported he has had bilateral leg swelling from knees to ankles for past 3 days and elevation has not helped them.  He stated he has had trouble breathing when lying flat at night for the last month.  He has some chest congestion. Today's weight is 114lbs which is only 1 pound weight gain in the last month.  Had patient send Optivol transmission and it revealed it is trending along baseline.    Discussed with Dr Excell Seltzer, DOD.  Recommendation: Lasix  daily x 3 days and have patient call office on 07/03/2015 to discuss effectiveness.  Dr Excell Seltzer stated if Lasix is effective for leg swelling, then member may continue on Lasix  daily.  BMP in 1 week.    Patient notified of Dr Earmon Phoenix recommendation of Lasix  daily x 3 days, call office on Tuesday for update on condition and labs in 1 week.  He verbalized understanding.

## 2015-07-03 NOTE — Telephone Encounter (Signed)
Call to patient.  Advised Dr Johney Frame recommended appointment with PA/NP this week.  Appointment set up with Boyce Medici, PA-C on Thursday, 07/05/2015 at 2:30PM.  Patient verbalized understanding and agreed to appointment.

## 2015-07-03 NOTE — Telephone Encounter (Signed)
Please set up with cardiology PA/NP to see in flex clinic if able this week.

## 2015-07-03 NOTE — Telephone Encounter (Signed)
Received call from patient for update after taking Lasix x 3 days.  He reported his breathing remains unchanged and difficult to breathe when lying flat at night.  He stated he can't tell a lot of difference in the leg swelling.  He said he has been having leg cramps for the last 2 nights.  He stated he did not think the Lasix was making a difference.  I stated Dr Excell Seltzer had said on Friday, if the Lasix did not help for the 3 days then he does not need to take on a regular basis.  He will not take anymore Lasix and will be in for Friday for lab as ordered by Dr Excell Seltzer on 06/29/2015. Weight today was 113lbs which is a 2lb drop in last 2 days.   I stated I would forward a message to Dr Johney Frame for any further recommendations regarding the leg swelling and difficulty breathing at night when lying flat.

## 2015-07-05 ENCOUNTER — Ambulatory Visit (INDEPENDENT_AMBULATORY_CARE_PROVIDER_SITE_OTHER): Payer: Medicare Other | Admitting: Cardiology

## 2015-07-05 ENCOUNTER — Encounter: Payer: Self-pay | Admitting: Cardiology

## 2015-07-05 VITALS — BP 120/90 | HR 70 | Ht 66.0 in | Wt 116.0 lb

## 2015-07-05 DIAGNOSIS — I739 Peripheral vascular disease, unspecified: Secondary | ICD-10-CM

## 2015-07-05 DIAGNOSIS — I779 Disorder of arteries and arterioles, unspecified: Secondary | ICD-10-CM

## 2015-07-05 DIAGNOSIS — I6522 Occlusion and stenosis of left carotid artery: Secondary | ICD-10-CM

## 2015-07-05 DIAGNOSIS — R600 Localized edema: Secondary | ICD-10-CM

## 2015-07-05 DIAGNOSIS — I509 Heart failure, unspecified: Secondary | ICD-10-CM

## 2015-07-05 LAB — BASIC METABOLIC PANEL
BUN: 17 mg/dL (ref 6–23)
CO2: 32 mEq/L (ref 19–32)
Calcium: 9 mg/dL (ref 8.4–10.5)
Chloride: 102 mEq/L (ref 96–112)
Creatinine, Ser: 0.75 mg/dL (ref 0.40–1.50)
GFR: 110.37 mL/min (ref >60.00)
Glucose, Bld: 93 mg/dL (ref 70–99)
POTASSIUM: 3.8 meq/L (ref 3.5–5.1)
Sodium: 141 mEq/L (ref 135–145)

## 2015-07-05 MED ORDER — POTASSIUM CHLORIDE CRYS ER 20 MEQ PO TBCR: 20.0000 meq | EXTENDED_RELEASE_TABLET | Freq: Every day | ORAL | Status: DC

## 2015-07-05 MED ORDER — FUROSEMIDE 40 MG PO TABS: 40.0000 mg | ORAL_TABLET | Freq: Every day | ORAL | Status: DC

## 2015-07-05 MED ORDER — FUROSEMIDE 20 MG PO TABS: 40.0000 mg | ORAL_TABLET | Freq: Every day | ORAL | Status: DC

## 2015-07-05 NOTE — Patient Instructions (Addendum)
Medication Instructions:  Your physician has recommended you make the following change in your medication:  1.  Increase your Lasix to 40 mgs taking 1 tablet daily  2.  Start Potassium 20 meq taking 1 tablet daily.   Labwork: TODAY - BMP  Testing/Procedures: Your physician has requested that you have an echocardiogram ASAP.  Echocardiography is a painless test that uses sound waves to create images of your heart. It provides your doctor with information about the size and shape of your heart and how well your heart's chambers and valves are working. This procedure takes approximately one hour. There are no restrictions for this procedure.    Follow-Up: Your physician recommends that you schedule a follow-up appointment in: 1 WEEK WITH THE 1ST AVAILABLE EXTENDER (AFTER THE ECHO IS COMPLETED)   Any Other Special Instructions Will Be Listed Below (If Applicable).

## 2015-07-05 NOTE — Progress Notes (Signed)
07/05/2015 Leonard Massey   1946/08/16  [id number]  Primary Physician Leonard Bussing [name], DO Primary Cardiologist/ Electrophysiologist: [name]  Reason for Visit/CC: Dyspnea and fluid retention.  HPI:  The patient is a 69 year old male, followed by [name]. He recently established care in the EP clinic as a new patient in June of this year, after having been previously [facility]. He has permanent atrial fibrillation and has a Public house manager BiV pacemaker which was implanted by Dr Bard Herbert at [facility] in March 2016 with concomitant AV nodal ablation. He also has a h/o heavy tobacco use and prior ETOh use and advanced COPD. He also had PVD for which he is followed by vascular surgery. Based on his records from [facility], he had an echo in February 2016 which revealed normal left ventricular systolic function with an ejection fraction of 55-60%. He denies any history of CAD.  He presents to clinic today with complaints of dyspnea and fluid retention that has worsened over the course of the week. He is asymptomatic at rest but notes dyspnea with exertion, decreased exercise tolerance, lower extremity edema as well as orthopnea. He has no respiratory distress while resting. He denies any chest discomfort. He reports that he was taking Lasix but he self discontinued this 2 days ago due to worsening leg cramps. He does not have prescribed supplemental potassium.  Note,  BMP from 08/2014 showed normal renal function with SCr at 0.68. EKG today demonstrates chronic atrial fibrillation with a controlled ventricular response of 70 bpm. Blood pressure is stable 120/90. He has been compliant with Eliquis.   Current Outpatient Prescriptions  Medication Sig Dispense Refill  . albuterol (PROAIR HFA) 108 (90 BASE) MCG/ACT inhaler Inhale 1-2 puffs into the lungs every 6 (six) hours as needed for wheezing or shortness of breath.    [name] apixaban (ELIQUIS) 5 MG TABS tablet Take 1 tablet (5 mg  total) by mouth 2 (two) times daily. 60 tablet 11  . budesonide (PULMICORT) 0.5 MG/2ML nebulizer solution INHALE 1 VIAL VIA NEBULIZER PO BID  3  . furosemide (LASIX) 20 MG tablet TAKE 1 TABLET(20 MG) BY MOUTH EVERY DAY AS DIRECTED 90 tablet 3  . HYDROcodone-acetaminophen (NORCO) 10-325 MG per tablet Take 1 tablet by mouth every 8 (eight) hours as needed for severe pain. 90 tablet 0  . ipratropium-albuterol (DUONEB) 0.5-2.5 (3) MG/3ML SOLN Inhale the contents of 1 vial by nebulization four times a day  3  . metoprolol succinate (TOPROL XL) 100 MG 24 hr tablet Take 1 tablet (100 mg total) by mouth daily. Take with or immediately following a meal. 90 tablet 3  . sertraline (ZOLOFT) 50 MG tablet TAKE 1 TABLET BY MOUTH DAILY 90 tablet 5  . simvastatin (ZOCOR) 40 MG tablet Take 1 tablet (40 mg total) by mouth at bedtime. 90 tablet 3  . SYMBICORT 160-4.5 MCG/ACT inhaler INHALE 2 PUFFS PO BID  3   No current facility-administered medications for this visit.    Allergies  Allergen Reactions  . Aspirin     REACTION: He denies anything more than mild gastric upset when he takes uncoated ASA    Social History   Social History  . Marital Status: Divorced    Spouse Name: [name]  . Number of Children: [name]  . Years of Education: [name]   Occupational History  . Not on file.   Social History Main Topics  . Smoking status: Current Some Day Smoker -- 1.50 packs/day  for 50 years    Types: Cigarettes    Start date: 07/15/2013    Last Attempt to Quit: 08/14/2013  . Smokeless tobacco: Former Neurosurgeon    Quit date: 02/01/2012     Comment: Using E-cigs. pt smoking about 3 cigarettes a month 04-12-15  . Alcohol Use: No  . Drug Use: Not on file  . Sexual Activity: Not on file   Other Topics Concern  . Not on file   Social History Narrative   Disabled several years, On SSDI and [place].   Lives with disabled girlfriend in a trailer [place].  Works around [employer] and keeps busy.      Review of  Systems: General: negative for chills, fever, night sweats or weight changes.  Cardiovascular: negative for chest pain, dyspnea on exertion, edema, orthopnea, palpitations, paroxysmal nocturnal dyspnea or shortness of breath Dermatological: negative for rash Respiratory: negative for cough or wheezing Urologic: negative for hematuria Abdominal: negative for nausea, vomiting, diarrhea, bright red blood per rectum, melena, or hematemesis Neurologic: negative for visual changes, syncope, or dizziness All other systems reviewed and are otherwise negative except as noted above.    Blood pressure 120/90, pulse 70, height  (1.676 m), weight 116 lb (52.617 kg).  General appearance: alert, cooperative and no distress Neck: no carotid bruit and no JVD Lungs: E creased breath sounds bilaterally consistent with COPD, no rales rhonchi or wheezing Heart: irregularly irregular rhythm and Regular rate Extremities: 2+ bilateral pedal edema Pulses: 2+ and symmetric Skin: warm and dry Neurologic: Grossly normal  EKG atrial fibrillation with a CVR. 70 bpm.   ASSESSMENT AND PLAN:     1. Acute CHF: 2D echo 11/2014 showed normal LVF with an EF of 50%. However, he notes new dyspnea and fluid retention. He has evidence of volume overload. He stopped his Lasix 2 days ago due to leg cramps (no prescribed supplemental K). We will check a BMP today to assess renal function and electrolytes. Will plan to restart Lasix at a dose of 40 mg daily with the addition of 20 mEq of supplemental K. Will adjust dose of lasix and/or supplemental K, if needed, based on BMP results. We discussed low sodium diet. Will arrange repeat BMP in 1 week and repeat f/u with an APP to reassess. This may be acute diastolic CHF, but need to r/o new LV dysfunction. Will recheck a 2D echo. Patient advised to obtain prior to f/u visit in 1 week.  2. Permanent Atrial Fibrillation: rate is controlled on metoprolol. Continue Eliquis for  a/c.  3. PPM: followed by [name]  PLAN  Plan outlined in detail above. F/U in 1 week for repeat assessment   SIMMONS, BRITTAINY PA-C 07/05/2015 2:48 PM

## 2015-07-09 ENCOUNTER — Ambulatory Visit (HOSPITAL_COMMUNITY)
Admission: RE | Admit: 2015-07-09 | Discharge: 2015-07-09 | Disposition: A | Discharge status: Home / Self Care | Payer: Medicare Other | Source: Ambulatory Visit | Attending: Cardiology | Admitting: Cardiology

## 2015-07-09 DIAGNOSIS — I509 Heart failure, unspecified: Secondary | ICD-10-CM | POA: Insufficient documentation

## 2015-07-09 DIAGNOSIS — E785 Hyperlipidemia, unspecified: Secondary | ICD-10-CM | POA: Insufficient documentation

## 2015-07-09 DIAGNOSIS — I340 Nonrheumatic mitral (valve) insufficiency: Secondary | ICD-10-CM | POA: Diagnosis not present

## 2015-07-09 DIAGNOSIS — I10 Essential (primary) hypertension: Secondary | ICD-10-CM | POA: Diagnosis not present

## 2015-07-09 DIAGNOSIS — I517 Cardiomegaly: Secondary | ICD-10-CM | POA: Diagnosis not present

## 2015-07-09 DIAGNOSIS — F17200 Nicotine dependence, unspecified, uncomplicated: Secondary | ICD-10-CM | POA: Insufficient documentation

## 2015-07-09 NOTE — Progress Notes (Signed)
  Echocardiogram 2D Echocardiogram has been performed.  Leonard Massey 07/09/2015, 2:05 PM

## 2015-07-10 ENCOUNTER — Other Ambulatory Visit: Payer: Self-pay | Admitting: Cardiology

## 2015-07-11 ENCOUNTER — Telehealth: Payer: Self-pay | Admitting: *Deleted

## 2015-07-11 DIAGNOSIS — I10 Essential (primary) hypertension: Secondary | ICD-10-CM

## 2015-07-11 NOTE — Telephone Encounter (Signed)
-----   Message from Allayne Butcher, [place] sent at 07/10/2015 12:04 PM EDT ----- BNP normal. Continue lasix and supplemental K as directed. Keep 1 week f/u with APP and recheck BMP at time of visit.

## 2015-07-11 NOTE — Telephone Encounter (Signed)
Spoke with pt and advised him that his labs were normal and that [place], PA-C recommended him to keep his f/u appt with Ronie Spies 07-17-15 and repeat his BMP at that time.  Order was put in [facility] for pt.  Pt verbalized understanding.

## 2015-07-17 ENCOUNTER — Other Ambulatory Visit: Payer: Self-pay | Admitting: Internal Medicine

## 2015-07-17 ENCOUNTER — Ambulatory Visit (INDEPENDENT_AMBULATORY_CARE_PROVIDER_SITE_OTHER): Payer: Medicare Other | Admitting: Physician Assistant

## 2015-07-17 ENCOUNTER — Other Ambulatory Visit (INDEPENDENT_AMBULATORY_CARE_PROVIDER_SITE_OTHER): Payer: Medicare Other | Admitting: *Deleted

## 2015-07-17 ENCOUNTER — Encounter: Payer: Self-pay | Admitting: Physician Assistant

## 2015-07-17 VITALS — BP 100/70 | HR 72 | Ht 66.0 in | Wt 107.0 lb

## 2015-07-17 DIAGNOSIS — I2510 Atherosclerotic heart disease of native coronary artery without angina pectoris: Secondary | ICD-10-CM

## 2015-07-17 DIAGNOSIS — I10 Essential (primary) hypertension: Secondary | ICD-10-CM

## 2015-07-17 DIAGNOSIS — Z87891 Personal history of nicotine dependence: Secondary | ICD-10-CM | POA: Diagnosis not present

## 2015-07-17 DIAGNOSIS — Z8679 Personal history of other diseases of the circulatory system: Secondary | ICD-10-CM

## 2015-07-17 DIAGNOSIS — I5031 Acute diastolic (congestive) heart failure: Secondary | ICD-10-CM | POA: Diagnosis not present

## 2015-07-17 DIAGNOSIS — R0600 Dyspnea, unspecified: Secondary | ICD-10-CM

## 2015-07-17 DIAGNOSIS — Z950 Presence of cardiac pacemaker: Secondary | ICD-10-CM

## 2015-07-17 DIAGNOSIS — I482 Chronic atrial fibrillation: Secondary | ICD-10-CM

## 2015-07-17 DIAGNOSIS — I4821 Permanent atrial fibrillation: Secondary | ICD-10-CM

## 2015-07-17 LAB — HEPATIC FUNCTION PANEL
ALBUMIN: 3.9 g/dL (ref 3.5–5.2)
ALT: 17 U/L (ref 0–53)
AST: 29 U/L (ref 0–37)
Alkaline Phosphatase: 63 U/L (ref 39–117)
BILIRUBIN DIRECT: 0.2 mg/dL (ref 0.0–0.3)
TOTAL PROTEIN: 7.2 g/dL (ref 6.0–8.3)
Total Bilirubin: 0.4 mg/dL (ref 0.2–1.2)

## 2015-07-17 LAB — BASIC METABOLIC PANEL
BUN: 25 mg/dL — ABNORMAL HIGH (ref 6–23)
CHLORIDE: 102 meq/L (ref 96–112)
CO2: 29 meq/L (ref 19–32)
Calcium: 9.2 mg/dL (ref 8.4–10.5)
Creatinine, Ser: 0.79 mg/dL (ref 0.40–1.50)
GFR: 103.93 mL/min (ref >60.00)
Glucose, Bld: 111 mg/dL — ABNORMAL HIGH (ref 70–99)
POTASSIUM: 4.6 meq/L (ref 3.5–5.1)
Sodium: 139 mEq/L (ref 135–145)

## 2015-07-17 LAB — CBC WITH DIFFERENTIAL/PLATELET
BASOS ABS: 0 10*3/uL (ref 0.0–0.1)
Basophils Relative: 0.4 % (ref 0.0–3.0)
EOS ABS: 0.1 10*3/uL (ref 0.0–0.7)
Eosinophils Relative: 1.4 % (ref 0.0–5.0)
HCT: 41.2 % (ref 39.0–52.0)
Hemoglobin: 12.9 g/dL — ABNORMAL LOW (ref 13.0–17.0)
Lymphocytes Relative: 16.3 % (ref 12.0–46.0)
Lymphs Abs: 1.4 10*3/uL (ref 0.7–4.0)
MCHC: 31.4 g/dL (ref 30.0–36.0)
MCV: 84.1 fl (ref 78.0–100.0)
MONOS PCT: 9 % (ref 3.0–12.0)
Monocytes Absolute: 0.8 10*3/uL (ref 0.1–1.0)
NEUTROS ABS: 6.3 10*3/uL (ref 1.4–7.7)
NEUTROS PCT: 72.9 % (ref 43.0–77.0)
PLATELETS: 354 10*3/uL (ref 150.0–400.0)
RBC: 4.9 Mil/uL (ref 4.22–5.81)
RDW: 19 % — ABNORMAL HIGH (ref 11.5–15.5)
WBC: 8.7 10*3/uL (ref 4.0–10.5)

## 2015-07-17 LAB — TSH: TSH: 2.5 u[IU]/mL (ref 0.35–4.50)

## 2015-07-17 NOTE — Progress Notes (Signed)
Cardiology Office Note Date:  07/17/2015  Patient ID:  Leonard Massey Feb 09, 1946, MRN [id number] PCP:  Gust Rung, DO  Cardiologist:  Allred   Chief Complaint: follow-up CHF  History of Present Illness: Leonard Massey is a 69 y.o. male with history of HTN, permanent atrial fibrillation s/p [employer] Jude BiV pacemaker implanted by Dr. Bard Herbert at [facility] in 12/2014 with concomitant AVN ablation, h/o heavy tobacco use with advanced COPD, prior EtOH use, PVD followed by vascular surgery (prior carotid subclavian bypass for subclavian steal ~03, bilateral aortoiliac and superficial femoral occlusive disease with calf claudication being managed medically) who presents for follow-up. Per report he has no history of CAD. He was seen 07/05/15 by Robbie Lis PA-C with complaints of dyspnea, fluid retention (edema + orthopnea), and decreased exercise tolerance. He had been previously on Lasix but had self-discontinued this 2 days prior to appointment due to leg cramps. Weight was up 5lb. He was started back on Lasix  daily with additional potassium for suspected CHF. He reported compliance with Eliquis. 2D echo 07/09/15: EF 55-60% with normal LV wall thickness, no RWMA, mild MR, severe LAE, mod RAE, mild-mod increased PASP.  He comes back in today to clinic with resolution of his fluid retention. He stopped Lasix on 9/16 due to resolution of edema and development of a "rash" 3 days prior - has rare scattered excoriations on his legs and one spot on his scalp but no papules, macules or erythema. He still reports dyspnea with exertion and decreased exercise tolerance. He saw his lung doctor today in [place] who diagnosed him with a lung infection after doing a CXR. He has been started on antibiotics. He endorses rare occasional chest pain without any significant pattern (for example occasionally feels like he can't lay on the side where he has his device placed). He is still smoking occasionally. Note that  he had a CT chest in 02/2015 showing lung RADS Category 2, benign appearance or behavior (with recommendation for annual screening with low-dose chest CT without contrast in 12 months), diffuse bronchial wall thickening with emphysema; imaging findings suggestive of underlying COPD and atherosclerotic disease including 3 vessel coronary artery calcification.  Past Medical History  Diagnosis Date  . COPD (chronic obstructive pulmonary disease)   . Depression   . GERD (gastroesophageal reflux disease)     Pt reports having EGD/colonoscopy in 2010 by Dr. [name] in [place] wich showed ulcers on uppper and normal colon. No report in emr.   . Hyperlipidemia     Pt typically has low HDL and low LDL.   [name] Hypertension   . Low back pain   . Cervical spondylosis   . Alcoholism   . Subclavian steal syndrome     HX of.   . Right knee pain 8/10    Began to complain of after requesting a scooter.  Exam was negative and it was decided that pt should continue to ambulate.   . Permanent atrial fibrillation     chads2vasc score is at least 2, on eliquis  . Complete heart block     s/p AV nodal ablation    Past Surgical History  Procedure Laterality Date  . Carotid endartectomy Left   . Left common carotid to subclavian artery bypass  04/1999  . Anterior cervical diskectomy and fusion at c4-5 and c5-6  2002    Dr. Wynetta Emery  . Pacemaker insertion  01/04/2015    SJM PM 3242 CRT-P implanted by Dr Sharol Harness  at [facility] for AV block s/p AV nodal ablation  . Av nodal ablation  3/16    At [facility]    Current Outpatient Prescriptions  Medication Sig Dispense Refill  . albuterol (PROAIR HFA) 108 (90 BASE) MCG/ACT inhaler Inhale 1-2 puffs into the lungs every 6 (six) hours as needed for wheezing or shortness of breath.    [name] apixaban (ELIQUIS) 5 MG TABS tablet Take 1 tablet (5 mg total) by mouth 2 (two) times daily. 60 tablet 11  . budesonide (PULMICORT) 0.5 MG/2ML nebulizer solution INHALE 1 VIAL VIA  NEBULIZER PO BID  3  . furosemide (LASIX) 40 MG tablet Take 1 tablet (40 mg total) by mouth daily. 90 tablet 1  . HYDROcodone-acetaminophen (NORCO) 10-325 MG per tablet Take 1 tablet by mouth every 8 (eight) hours as needed for severe pain. 90 tablet 0  . ipratropium-albuterol (DUONEB) 0.5-2.5 (3) MG/3ML SOLN Inhale the contents of 1 vial by nebulization four times a day  3  . metoprolol succinate (TOPROL XL) 100 MG 24 hr tablet Take 1 tablet (100 mg total) by mouth daily. Take with or immediately following a meal. 90 tablet 3  . potassium chloride SA (K-DUR,KLOR-CON) 20 MEQ tablet Take 1 tablet (20 mEq total) by mouth daily. 90 tablet 3  . sertraline (ZOLOFT) 50 MG tablet TAKE 1 TABLET BY MOUTH DAILY 90 tablet 3  . simvastatin (ZOCOR) 40 MG tablet Take 1 tablet (40 mg total) by mouth at bedtime. 90 tablet 3  . SYMBICORT 160-4.5 MCG/ACT inhaler INHALE 2 PUFFS PO BID  3   No current facility-administered medications for this visit.    Allergies:   Aspirin   Social History:  The patient  reports that he has been smoking Cigarettes.  He started smoking about 2 years ago. He has a 75 pack-year smoking history. He quit smokeless tobacco use about 3 years ago. He reports that he does not drink alcohol.   Family History:  The patient's family history includes Heart attack in his mother and sister; Heart failure in his mother; Hypertension in his sister. There is no history of Stroke.  ROS:  Please see the history of present illness.   All other systems are reviewed and otherwise negative.   PHYSICAL EXAM:  VS:  BP 100/70 mmHg  Pulse 72  Ht  (1.676 m)  Wt 107 lb (48.535 kg)  BMI 17.28 kg/m2  SpO2 96% BMI: Body mass index is 17.28 kg/(m^2). Frail appearing WM in no acute distress, appears older than stated age HEENT: normocephalic, atraumatic Neck: no JVD or masses Cardiac:  normal S1, S2; RRR; no murmurs, rubs, or gallops Lungs:  Diffusely diminished but no wheezing, rhonchi or  rales Abd: soft, nontender, no hepatomegaly, + BS MS: no deformity or atrophy Ext: no edema. Rare excoriation on legs (looks like the kind of scab that shows up when someone scrapes against something), also a small 3mm sized scab on his occipital scalp Skin: warm and dry, no rash Neuro:  moves all extremities spontaneously, no focal abnormalities noted, follows commands Psych: euthymic mood, full affect  Recent Labs: 09/07/2014: Hemoglobin 13.8; Platelets 265; TSH 1.261 07/05/2015: BUN 17; Creatinine, Ser 0.75; Potassium 3.8; Sodium 141  09/07/2014: Cholesterol 129; HDL 48; LDL Cholesterol 64; Total CHOL/HDL Ratio 2.7; Triglycerides 83; VLDL 17   Estimated Creatinine Clearance: 61.5 mL/min (by C-G formula based on Cr of 0.75).   Wt Readings from Last 3 Encounters:  07/17/15 107 lb (48.535 kg)  07/05/15 116 lb (52.617 kg)  06/19/15 113 lb (51.256 kg)     Other studies reviewed: Additional studies/records reviewed today include: summarized above  ASSESSMENT AND PLAN:  1. Possible acute diastolic CHF, but with continued dyspnea - weight is down 13lbs and he reports resolution of edema. However, he still endorses decreased exercise tolerance. He reports being diagnosed with a lung infection today at his pulmonologist in  via CXR. He is now on antibiotics. He self-discontinued Lasix and potassium due to possible rash. This does not look typical for a drug rash. I asked him to please have this evaluated by primary care. I think it is fine for him to stay off the diuretic/potassium since he had such a profound response to it. Will recheck BMET today but will also add CBC, TSH and LFTs (for albumin) given recent symptoms. He would also benefit from ischemic eval to rule out CAD as cause of his symptoms. I considered cardiac CT but then discovered he had had a CT in 02/2015 showing coronary artery calcification. Recent echocardiogram showed normal LV function without any WMA. Will proceed with  Lexiscan nuclear stress testing in a little over a week to give him time to recover from his lung infection. Suspect dyspnea will turn out to be multifactorial with significant underlying COPD. He is not wheezing today. Reviewed ER warning signs.  2. Permanent atrial fibrillation - continue current regimen. Reports compliance with Eliquis. 3. History of BiV pacemaker - followed by [name]. 4. History of tobacco abuse - counseled on importance of cessation.  Disposition: F/u with [name] or APP in 1-2 months.  Current medicines are reviewed at length with the patient today.  The patient did not have any concerns regarding medicines.  Thomasene Mohair PA-C 07/17/2015 3:51 PM     [facility] [address] [place] [address] [phone] (office)  [phone] (fax)

## 2015-07-17 NOTE — Patient Instructions (Addendum)
Medication Instructions:  Your physician has recommended you make the following change in your medication:  1.  STOP the Lasix (Fursoemide) 2.  STOP the Potassium     Labwork: None ordered  Testing/Procedures: Your physician has requested that you have a lexiscan myoview.  PLEASE SCHEDULE ON 07-27-15 OR AFTER.    For further information please visit [email or web address] Please follow instruction sheet, as given.   Follow-Up: Your physician wants you to follow-up in:  1-2 MONTHS WITH DR. ALLRED OR 1ST AVAILABLE EXTENDER.  You will receive a reminder letter in the mail two months in advance. If you don't receive a letter, please call our office to schedule the follow-up appointment.   Any Other Special Instructions Will Be Listed Below (If Applicable). Pharmacologic Stress Electrocardiogram A pharmacologic stress electrocardiogram is a heart (cardiac) test that uses nuclear imaging to evaluate the blood supply to your heart. This test may also be called a pharmacologic stress electrocardiography. Pharmacologic means that a medicine is used to increase your heart rate and blood pressure.  This stress test is done to find areas of poor blood flow to the heart by determining the extent of coronary artery disease (CAD). Some people exercise on a treadmill, which naturally increases the blood flow to the heart. For those people unable to exercise on a treadmill, a medicine is used. This medicine stimulates your heart and will cause your heart to beat harder and more quickly, as if you were exercising.  Pharmacologic stress tests can help determine:  The adequacy of blood flow to your heart during increased levels of activity in order to clear you for discharge home.  The extent of coronary artery blockage caused by CAD.  Your prognosis if you have suffered a heart attack.  The effectiveness of cardiac procedures done, such as an angioplasty, which can increase the circulation in your coronary  arteries.  Causes of chest pain or pressure. LET [facility] CARE PROVIDER KNOW ABOUT:  Any allergies you have.  All medicines you are taking, including vitamins, herbs, eye drops, creams, and over-the-counter medicines.  Previous problems you or members of your family have had with the use of anesthetics.  Any blood disorders you have.  Previous surgeries you have had.  Medical conditions you have.  Possibility of pregnancy, if this applies.  If you are currently breastfeeding. RISKS AND COMPLICATIONS Generally, this is a safe procedure. However, as with any procedure, complications can occur. Possible complications include:  You develop pain or pressure in the following areas:  Chest.  Jaw or neck.  Between your shoulder blades.  Radiating down your left arm.  Headache.  Dizziness or light-headedness.  Shortness of breath.  Increased or irregular heartbeat.  Low blood pressure.  Nausea or vomiting.  Flushing.  Redness going up the arm and slight pain during injection of medicine.  Heart attack (rare). BEFORE THE PROCEDURE   Avoid all forms of caffeine for 24 hours before your test or as directed by your health care provider. This includes coffee, tea (even decaffeinated tea), caffeinated sodas, chocolate, cocoa, and certain pain medicines.  Follow your health care provider's instructions regarding eating and drinking before the test.  Take your medicines as directed at regular times with water unless instructed otherwise. Exceptions may include:  If you have diabetes, ask how you are to take your insulin or pills. It is common to adjust insulin dosing the morning of the test.  If you are taking beta-blocker medicines, it is important to talk  to your health care provider about these medicines well before the date of your test. Taking beta-blocker medicines may interfere with the test. In some cases, these medicines need to be changed or stopped 24 hours or  more before the test.  If you wear a nitroglycerin patch, it may need to be removed prior to the test. Ask your health care provider if the patch should be removed before the test.  If you use an inhaler for any breathing condition, bring it with you to the test.  If you are an outpatient, bring a snack so you can eat right after the stress phase of the test.  Do not smoke for 4 hours prior to the test or as directed by your health care provider.  Do not apply lotions, powders, creams, or oils on your chest prior to the test.  Wear comfortable shoes and clothing. Let your health care provider know if you were unable to complete or follow the preparations for your test. PROCEDURE   Multiple patches (electrodes) will be put on your chest. If needed, small areas of your chest may be shaved to get better contact with the electrodes. Once the electrodes are attached to your body, multiple wires will be attached to the electrodes, and your heart rate will be monitored.  An IV access will be started. A nuclear trace (isotope) is given. The isotope may be given intravenously, or it may be swallowed. Nuclear refers to several types of radioactive isotopes, and the nuclear isotope lights up the arteries so that the nuclear images are clear. The isotope is absorbed by your body. This results in low radiation exposure.  A resting nuclear image is taken to show how your heart functions at rest.  A medicine is given through the IV access.  A second scan is done about 1 hour after the medicine injection and determines how your heart functions under stress.  During this stress phase, you will be connected to an electrocardiogram machine. Your blood pressure and oxygen levels will be monitored. AFTER THE PROCEDURE   Your heart rate and blood pressure will be monitored after the test.  You may return to your normal schedule, including diet,activities, and medicines, unless your health care provider  tells you otherwise. Document Released: 03/01/2009 Document Revised: 10/18/2013 Document Reviewed: 06/20/2013 [facility] Patient Information 2015 [place], [place]. This information is not intended to replace advice given to you by your health care provider. Make sure you discuss any questions you have with your health care provider.

## 2015-07-19 ENCOUNTER — Emergency Department (HOSPITAL_COMMUNITY): Payer: Medicare Other

## 2015-07-19 ENCOUNTER — Telehealth: Payer: Self-pay | Admitting: Internal Medicine

## 2015-07-19 ENCOUNTER — Ambulatory Visit (INDEPENDENT_AMBULATORY_CARE_PROVIDER_SITE_OTHER): Payer: Medicare Other | Admitting: *Deleted

## 2015-07-19 ENCOUNTER — Encounter (HOSPITAL_COMMUNITY): Payer: Self-pay | Admitting: Emergency Medicine

## 2015-07-19 ENCOUNTER — Telehealth: Payer: Self-pay | Admitting: Cardiology

## 2015-07-19 ENCOUNTER — Emergency Department (HOSPITAL_COMMUNITY)
Admission: EM | Admit: 2015-07-19 | Discharge: 2015-07-19 | Disposition: A | Discharge status: Home / Self Care | Payer: Medicare Other | Attending: Emergency Medicine | Admitting: Emergency Medicine

## 2015-07-19 DIAGNOSIS — I10 Essential (primary) hypertension: Secondary | ICD-10-CM | POA: Insufficient documentation

## 2015-07-19 DIAGNOSIS — J189 Pneumonia, unspecified organism: Secondary | ICD-10-CM

## 2015-07-19 DIAGNOSIS — J159 Unspecified bacterial pneumonia: Secondary | ICD-10-CM | POA: Insufficient documentation

## 2015-07-19 DIAGNOSIS — Z720 Tobacco use: Secondary | ICD-10-CM | POA: Diagnosis not present

## 2015-07-19 DIAGNOSIS — I2510 Atherosclerotic heart disease of native coronary artery without angina pectoris: Secondary | ICD-10-CM | POA: Diagnosis not present

## 2015-07-19 DIAGNOSIS — Z7982 Long term (current) use of aspirin: Secondary | ICD-10-CM | POA: Diagnosis not present

## 2015-07-19 DIAGNOSIS — J438 Other emphysema: Secondary | ICD-10-CM

## 2015-07-19 DIAGNOSIS — Z79899 Other long term (current) drug therapy: Secondary | ICD-10-CM | POA: Diagnosis not present

## 2015-07-19 DIAGNOSIS — J441 Chronic obstructive pulmonary disease with (acute) exacerbation: Secondary | ICD-10-CM | POA: Insufficient documentation

## 2015-07-19 DIAGNOSIS — I509 Heart failure, unspecified: Secondary | ICD-10-CM | POA: Diagnosis not present

## 2015-07-19 DIAGNOSIS — F329 Major depressive disorder, single episode, unspecified: Secondary | ICD-10-CM | POA: Diagnosis not present

## 2015-07-19 DIAGNOSIS — E785 Hyperlipidemia, unspecified: Secondary | ICD-10-CM | POA: Diagnosis not present

## 2015-07-19 DIAGNOSIS — I442 Atrioventricular block, complete: Secondary | ICD-10-CM

## 2015-07-19 DIAGNOSIS — Z95810 Presence of automatic (implantable) cardiac defibrillator: Secondary | ICD-10-CM | POA: Diagnosis not present

## 2015-07-19 DIAGNOSIS — Z8719 Personal history of other diseases of the digestive system: Secondary | ICD-10-CM | POA: Diagnosis not present

## 2015-07-19 DIAGNOSIS — R0602 Shortness of breath: Secondary | ICD-10-CM | POA: Diagnosis present

## 2015-07-19 LAB — BASIC METABOLIC PANEL
ANION GAP: 9 (ref 5–15)
BUN: 23 mg/dL — ABNORMAL HIGH (ref 6–20)
CALCIUM: 9.6 mg/dL (ref 8.9–10.3)
CO2: 28 mmol/L (ref 22–32)
Chloride: 104 mmol/L (ref 101–111)
Creatinine, Ser: 0.7 mg/dL (ref 0.61–1.24)
GFR calc non Af Amer: >60 mL/min (ref >60)
Glucose, Bld: 155 mg/dL — ABNORMAL HIGH (ref 65–99)
POTASSIUM: 4.9 mmol/L (ref 3.5–5.1)
Sodium: 141 mmol/L (ref 135–145)

## 2015-07-19 LAB — I-STAT TROPONIN, ED: TROPONIN I, POC: 0 ng/mL (ref 0.00–0.08)

## 2015-07-19 LAB — CBC
HEMATOCRIT: 41.9 % (ref 39.0–52.0)
HEMOGLOBIN: 12.7 g/dL — AB (ref 13.0–17.0)
MCH: 25.8 pg — ABNORMAL LOW (ref 26.0–34.0)
MCHC: 30.3 g/dL (ref 30.0–36.0)
MCV: 85 fL (ref 78.0–100.0)
Platelets: 329 10*3/uL (ref 150–400)
RBC: 4.93 MIL/uL (ref 4.22–5.81)
RDW: 16.8 % — ABNORMAL HIGH (ref 11.5–15.5)
WBC: 12.1 10*3/uL — AB (ref 4.0–10.5)

## 2015-07-19 LAB — I-STAT CG4 LACTIC ACID, ED
Lactic Acid, Venous: 2.82 mmol/L (ref 0.5–2.0)
Lactic Acid, Venous: 2.65 mmol/L (ref 0.5–2.0)

## 2015-07-19 LAB — BRAIN NATRIURETIC PEPTIDE: B Natriuretic Peptide: 670.3 pg/mL — ABNORMAL HIGH (ref 0.0–100.0)

## 2015-07-19 LAB — PROTIME-INR
INR: 1.52 — AB (ref 0.00–1.49)
PROTHROMBIN TIME: 18.4 s — AB (ref 11.6–15.2)

## 2015-07-19 MED ORDER — LEVOFLOXACIN 500 MG PO TABS: 500.0000 mg | ORAL_TABLET | Freq: Every day | ORAL | Status: DC

## 2015-07-19 MED ORDER — ALBUTEROL (5 MG/ML) CONTINUOUS INHALATION SOLN
10.0000 mg/h | INHALATION_SOLUTION | RESPIRATORY_TRACT | Status: DC
  Administered 2015-07-19: 10 mg/h via RESPIRATORY_TRACT
  Filled 2015-07-19: qty 20

## 2015-07-19 MED ORDER — IPRATROPIUM BROMIDE 0.02 % IN SOLN
0.5000 mg | Freq: Once | RESPIRATORY_TRACT | Status: AC
  Administered 2015-07-19: 0.5 mg via RESPIRATORY_TRACT
  Filled 2015-07-19: qty 2.5

## 2015-07-19 MED ORDER — IOHEXOL 350 MG/ML SOLN
80.0000 mL | Freq: Once | INTRAVENOUS | Status: AC | PRN
  Administered 2015-07-19: 80 mL via INTRAVENOUS

## 2015-07-19 MED ORDER — LEVOFLOXACIN IN D5W 750 MG/150ML IV SOLN
750.0000 mg | Freq: Once | INTRAVENOUS | Status: AC
  Administered 2015-07-19: 750 mg via INTRAVENOUS
  Filled 2015-07-19: qty 150

## 2015-07-19 MED ORDER — SODIUM CHLORIDE 0.9 % IV BOLUS (SEPSIS)
1000.0000 mL | Freq: Once | INTRAVENOUS | Status: AC
  Administered 2015-07-19: 1000 mL via INTRAVENOUS

## 2015-07-19 MED ORDER — METHYLPREDNISOLONE SODIUM SUCC 125 MG IJ SOLR
125.0000 mg | Freq: Once | INTRAMUSCULAR | Status: AC
  Administered 2015-07-19: 125 mg via INTRAVENOUS
  Filled 2015-07-19: qty 2

## 2015-07-19 MED ORDER — ALBUTEROL SULFATE (2.5 MG/3ML) 0.083% IN NEBU
5.0000 mg | INHALATION_SOLUTION | Freq: Once | RESPIRATORY_TRACT | Status: DC
  Filled 2015-07-19: qty 6

## 2015-07-19 NOTE — ED Notes (Signed)
Pt was able to ambulate with no assistance. SpO2 started at 97% and decreased to 96% but held at 96% throughout ambulation. Pt has steady gate and was able to walk at a brisk pace. Pt tolerated well.

## 2015-07-19 NOTE — Telephone Encounter (Signed)
LMOVM reminding pt to send remote transmission.   

## 2015-07-19 NOTE — Telephone Encounter (Signed)
New Message   Pt sister is calling and she states that pt is having trouble breathing  Pt c/o Shortness Of Breath: STAT if SOB developed within the last 24 hours or pt is noticeably SOB on the phone  1. Are you currently SOB (can you hear that pt is SOB on the phone)? yes  2. How long have you been experiencing SOB?  Yesterday it has gotten worse  3. Are you SOB when sitting or when up moving around?      both  4. Are you currently experiencing any other symptoms? Pt looks pale

## 2015-07-19 NOTE — ED Notes (Signed)
76 yom presents to ED with c/o CP and SOB. Onset this morning when he woke up around 1030. Patient states he has been having difficulty sleeping and did not go to bed until 0800 this morning. Patient c/o 6 out of 10 sharp pain. Patient states it comes and goes and gets worse with movement. Patient states he has been dx with an infection in the lungs by his "lung doctor", Dr. Marcellus Scott. Patient has a follow up appointment with him on 09/28. Denies nausea, dizziness, headache. Patient does have pacemaker that was placed about 6 months ago.

## 2015-07-19 NOTE — Discharge Instructions (Signed)
Take levaquin for a week.   Continue taking your steroids as prescribed.  Use albuterol every 4 hrs for 2 days then as needed.  See your doctor.   Return to ER if you have worsening shortness of breath, chest pain, fevers.

## 2015-07-19 NOTE — ED Provider Notes (Signed)
CSN: [id number]     Arrival date & time 07/19/15  1256 History   First MD Initiated Contact with Patient 07/19/15 1507     Chief Complaint  Patient presents with  . Chest Pain  . Shortness of Breath     (Consider location/radiation/quality/duration/timing/severity/associated sxs/prior Treatment) The history is provided by the patient.  Leonard Massey is a 69 y.o. male hx of COPD, GERD, HL here with shortness of breath, chest pain. Patient has intermittent shortness of breath for the last several weeks. Patient was diagnosed with pneumonia and finished a course of antibiotics several weeks ago. Has been seeing his cardiologist recently and was diagnosed with worsening CHF so placed on lasix. Saw pulmonary 3 days ago and was started on prednisone. Today, he woke up and his shortness of breath is worse. Minimal chest pain. Shortness of breath worse with exertion. Denies fevers. He has known lung nodule but no diagnosis of lung cancer. He is on Eliquis for afib and has a pacemaker.    Past Medical History  Diagnosis Date  . COPD (chronic obstructive pulmonary disease)   . Depression   . GERD (gastroesophageal reflux disease)     Pt reports having EGD/colonoscopy in 2010 by Dr. [name] in [place] wich showed ulcers on uppper and normal colon. No report in emr.   . Hyperlipidemia     Pt typically has low HDL and low LDL.   [name] Hypertension   . Low back pain   . Cervical spondylosis   . Alcoholism   . Subclavian steal syndrome     HX of.   . Right knee pain 8/10    Began to complain of after requesting a scooter.  Exam was negative and it was decided that pt should continue to ambulate.   . Permanent atrial fibrillation     chads2vasc score is at least 2, on eliquis  . Complete heart block     s/p AV nodal ablation  . Coronary artery calcification seen on CT scan 02/2015  . Emphysema of lung   . CHF (congestive heart failure)    Past Surgical History  Procedure Laterality Date  . Carotid  endartectomy Left   . Left common carotid to subclavian artery bypass  04/1999  . Anterior cervical diskectomy and fusion at c4-5 and c5-6  2002    Dr. Wynetta Emery  . Pacemaker insertion  01/04/2015    SJM PM 3242 CRT-P implanted by Dr Sharol Harness at [facility] for AV block s/p AV nodal ablation  . Av nodal ablation  3/16    At [facility]   Family History  Problem Relation Age of Onset  . Heart failure Mother   . Hypertension Sister   . Heart attack Sister   . Heart attack Mother   . Stroke Neg Hx    Social History  Substance Use Topics  . Smoking status: Current Some Day Smoker -- 1.50 packs/day for 50 years    Types: Cigarettes    Start date: 07/15/2013    Last Attempt to Quit: 08/14/2013  . Smokeless tobacco: Former Neurosurgeon    Quit date: 02/01/2012     Comment: Using E-cigs. pt smoking about 3 cigarettes a month 04-12-15  . Alcohol Use: No    Review of Systems  Respiratory: Positive for shortness of breath.   Cardiovascular: Positive for chest pain.  All other systems reviewed and are negative.     Allergies  Aspirin  Home Medications   Prior to Admission  medications   Medication Sig Start Date End Date Taking? Authorizing Provider  albuterol (PROAIR HFA) 108 (90 BASE) MCG/ACT inhaler Inhale 1-2 puffs into the lungs every 6 (six) hours as needed for wheezing or shortness of breath.   Yes Historical Provider, MD  apixaban (ELIQUIS) 5 MG TABS tablet Take 1 tablet (5 mg total) by mouth 2 (two) times daily. 03/15/15  Yes Gust Rung, DO  aspirin (ASPIRIN EC) 81 MG EC tablet Take 81 mg by mouth daily. Swallow whole.   Yes Historical Provider, MD  budesonide (PULMICORT) 0.5 MG/2ML nebulizer solution INHALE 1 VIAL VIA NEBULIZER PO BID 05/10/15  Yes Historical Provider, MD  HYDROcodone-acetaminophen (NORCO) 10-325 MG per tablet Take 1 tablet by mouth every 8 (eight) hours as needed for severe pain. 06/14/15  Yes Gust Rung, DO  ipratropium-albuterol (DUONEB) 0.5-2.5 (3) MG/3ML  SOLN Inhale the contents of 1 vial by nebulization four times a day 01/16/15  Yes Historical Provider, MD  metoprolol succinate (TOPROL XL) 100 MG 24 hr tablet Take 1 tablet (100 mg total) by mouth daily. Take with or immediately following a meal. 03/15/15 03/14/16 Yes Gust Rung, DO  predniSONE (DELTASONE) 10 MG tablet Take 40 mg by mouth daily with breakfast.   Yes Historical Provider, MD  sertraline (ZOLOFT) 50 MG tablet TAKE 1 TABLET BY MOUTH DAILY 07/17/15  Yes Gust Rung, DO  simvastatin (ZOCOR) 40 MG tablet Take 1 tablet (40 mg total) by mouth at bedtime. 03/15/15  Yes Gust Rung, DO  SYMBICORT 160-4.5 MCG/ACT inhaler INHALE 2 PUFFS PO BID 06/07/15  Yes Historical Provider, MD   BP 134/90 mmHg  Pulse 74  Resp 24  Ht  (1.676 m)  Wt 107 lb (48.535 kg)  BMI 17.28 kg/m2  SpO2 99% Physical Exam  Constitutional: He is oriented to person, place, and time. He appears well-developed and well-nourished.  HENT:  Head: Normocephalic.  Mouth/Throat: Oropharynx is clear and moist.  Eyes: Conjunctivae are normal. Pupils are equal, round, and reactive to light.  Neck: Normal range of motion. Neck supple.  Cardiovascular: Normal rate, regular rhythm and normal heart sounds.   Pulmonary/Chest: Effort normal.  Diminished, diffuse wheezing, no retractions   Abdominal: Soft. Bowel sounds are normal. He exhibits no distension. There is no tenderness. There is no rebound.  Musculoskeletal: Normal range of motion. He exhibits no edema or tenderness.  Neurological: He is alert and oriented to person, place, and time. No cranial nerve deficit. Coordination normal.  Skin: Skin is warm and dry.  Psychiatric: He has a normal mood and affect. His behavior is normal. Judgment and thought content normal.  Nursing note and vitals reviewed.   ED Course  Procedures (including critical care time) Labs Review Labs Reviewed  BASIC METABOLIC PANEL - Abnormal; Notable for the following:    Glucose,  Bld 155 (*)    BUN 23 (*)    All other components within normal limits  CBC - Abnormal; Notable for the following:    WBC 12.1 (*)    Hemoglobin 12.7 (*)    MCH 25.8 (*)    RDW 16.8 (*)    All other components within normal limits  PROTIME-INR - Abnormal; Notable for the following:    Prothrombin Time 18.4 (*)    INR 1.52 (*)    All other components within normal limits  BRAIN NATRIURETIC PEPTIDE - Abnormal; Notable for the following:    B Natriuretic Peptide 670.3 (*)    All  other components within normal limits  I-STAT CG4 LACTIC ACID, ED - Abnormal; Notable for the following:    Lactic Acid, Venous 2.82 (*)    All other components within normal limits  I-STAT CG4 LACTIC ACID, ED - Abnormal; Notable for the following:    Lactic Acid, Venous 2.65 (*)    All other components within normal limits  I-STAT TROPOININ, ED    Imaging Review Dg Chest 2 View  07/19/2015   CLINICAL DATA:  Short of breath, cough for a month  EXAM: CHEST  2 VIEW  COMPARISON:  CT 03/19/2015  FINDINGS: Normal cardiac silhouette.  LEFT pacemaker noted.  There is a nodular density along the RIGHT lateral chest wall which is not clearly seen on comparison CT from 03/19/2015. Lungs are hyperinflated. On the lateral projection there is thickening along the superior aspect of the RIGHT oblique fissure which may correspond to lateral abnormality on the anterior projection. Nipple shadows noted.  IMPRESSION: 1. New nodular thickening along the superior aspect of the RIGHT oblique fissure laterally. Recommend CT of thorax to exclude a developing pulmonary lesion.  2.  Emphysematous change.   Electronically Signed   By: Genevive Bi M.D.   On: 07/19/2015 14:46   Ct Angio Chest Pe W/cm &/or Wo Cm  07/19/2015   CLINICAL DATA:  CP and SOB. Onset this morning when he woke up around 1030. Patient states he has been having difficulty sleeping and did not go to bed until 0800 this morning. Patient c/o 6 out of 10 sharp pain.   EXAM: CT ANGIOGRAPHY CHEST WITH CONTRAST  TECHNIQUE: Multidetector CT imaging of the chest was performed using the standard protocol during bolus administration of intravenous contrast. Multiplanar CT image reconstructions and MIPs were obtained to evaluate the vascular anatomy.  CONTRAST:  80mL OMNIPAQUE IOHEXOL 350 MG/ML SOLN  COMPARISON:  03/19/2015  FINDINGS: Left subclavian transvenous pacing leads. Right arm IV contrast administration. The SVC is patent. There is mild right atrial enlargement with significant reflux of contrast into the IVC and hepatic veins from the right atrium. The RV is nondilated. Satisfactory opacification of pulmonary arteries noted, and there is no evidence of pulmonary emboli. Some streaming artifact in the right lower lobe pulmonary artery branch. Incomplete opacification of pulmonary veins. Left atrial enlargement. Scattered coronary calcifications. Coarse calcifications in the thoracic aorta without evidence of aneurysm. Incomplete aortic contrast opacification limiting assessment for dissection.  No pleural or pericardial effusion. No hilar or mediastinal adenopathy. Advanced emphysema. Peripheral airspace consolidation in the posterior right upper lobe, new since previous exam. Small branching nodular opacities peripherally in the posterior and lateral basal segments right lower lobe, new since prior study. Thoracic spine and sternum intact. Small hiatal hernia. Remainder visualized upper abdomen unremarkable.  Review of the MIP images confirms the above findings.  IMPRESSION: 1. Negative for acute PE. 2. New pleural-based airspace disease in the posterior right upper lobe, and new patchy nodular opacities in the posterior right lower lobe. These were not present on the most recent prior CT, and are likely infectious/inflammatory. Consider 3-6 month follow-up to confirm appropriate resolution and exclude neoplasm however. 3. Atherosclerosis, including aortic and coronary artery  disease. Please note that although the presence of coronary artery calcium documents the presence of coronary artery disease, the severity of this disease and any potential stenosis cannot be assessed on this non-gated CT examination. Assessment for potential risk factor modification, dietary therapy or pharmacologic therapy may be warranted, if clinically indicated.  Electronically Signed   By: Corlis Leak M.D.   On: 07/19/2015 16:33   I have personally reviewed and evaluated these images and lab results as part of my medical decision-making.   EKG Interpretation   Date/Time:  Thursday July 19 2015 13:55:50 EDT Ventricular Rate:  71 PR Interval:  352 QRS Duration: 128 QT Interval:  446 QTC Calculation: 484 R Axis:   -97 Text Interpretation:  Atrial-sensed ventricular-paced rhythm with  prolonged AV conduction Biventricular pacemaker detected Abnormal ECG  pacer spikes new since previous  Confirmed by YAO  MD, DAVID ([id number]) on  07/19/2015 4:42:16 PM      MDM   Final diagnoses:  None    Leonard Massey is a 69 y.o. male here with shortness of breath. Consider COPD exacerbation. I doubt CHF exacerbation. Consider recurrent pneumonia as well. Also consider PE. Will get labs, CXR, CT angio.   7:32 PM CT showed no PE but showed RU lobe pneumonia. WBC 12. Not hypoxic. Felt better with steroids, albuterol. Given levaquin. Repeat lactate improved. Wants to go home. Will dc home with levaquin, already on PO steroids.     Richardean Canal, MD 07/19/15 [id number]

## 2015-07-19 NOTE — Telephone Encounter (Signed)
Patient's sister calling about patient having SOB and chest pain. Patient is already in the ED with his sister. Advised patient's sister to talk with the person at the front desk and let them know the patient is having chest pain. Sister did as advised and got off the phone. Called ED Triage to make sure patient was being seen soon, due to chest pain, a nurse informed me that patient was already brought back to be evaluated.

## 2015-07-20 ENCOUNTER — Telehealth: Payer: Self-pay | Admitting: *Deleted

## 2015-07-20 ENCOUNTER — Encounter: Payer: Self-pay | Admitting: Internal Medicine

## 2015-07-20 DIAGNOSIS — I442 Atrioventricular block, complete: Secondary | ICD-10-CM

## 2015-07-20 DIAGNOSIS — Z95810 Presence of automatic (implantable) cardiac defibrillator: Secondary | ICD-10-CM | POA: Diagnosis not present

## 2015-07-20 NOTE — Progress Notes (Addendum)
EPIC Encounter for ICM Monitoring  Patient Name: Leonard Massey is a 69 y.o. male Date: 07/20/2015 Primary Care Physican: Gust Rung, DO Primary Cardiologist: Allred Electrophysiologist: Allred Dry Weight: 109 lb       In the past month, have you:  1. Gained more than 2 pounds in a day or more than 5 pounds in a week? Unsure of weight prior to ER visit.  Was 116lbs on 07/05/2015 and 107lbs on 07/17/2015 office visit  2. Had changes in your medications (with verification of current medications)? 07/20/2015   3. Had more shortness of breath than is usual for you? Yes, patient went to ER for SOB on 07/19/2015  4. Limited your activity because of shortness of breath? Yes  5. Not been able to sleep because of shortness of breath? Yes in the last week  6. Had increased swelling in your feet or ankles? Some ankle swelling  7. Had symptoms of dehydration (dizziness, dry mouth, increased thirst, decreased urine output) no  8. Had changes in sodium restriction? no  9. Been compliant with medication? Yes   ICM trend:   Follow-up plan: ICM clinic phone appointment 07/26/2015 for 1 week repeat follow up. Corvue impedance revealed above baseline starting 07/02/2015 to transmission date of 07/20/2015 which correlates to his recent diuresis.   Patient reported visit to ED on 07/19/2015 due to SOB.  He stated he has lost over 13 lbs and he is feeling better.  Denied any leg swelling, chest pain, or SOB.  He reported he would prefer to take Lasix and Potassium.  He asked if he could stop both Lasix and Potasssium and I stated he should follow the nurse's instructions that he spoke to earlier today.  Encouraged drink limited fluids but needs to stay hydrated.  He was diagnosed with pneumonia in the last month and had been taking antibiotics.                  Spoke with Ronie Spies, PA-C in office and discussed Optivol impedance above baseline which would indicate dehydration.  Asked if he should continue with  Lasix and Potassium.  She instructed patient to hold Lasix and Potassium.  Attempted call to patient, left message to return call for instructions on Lasix and Potassium.          Patient returned call and instruction given to hold Lasix and Potassium.  He verbalized understanding.    Copy of note sent to patient's primary care physician, primary cardiologist, and device following physician.  [name], RN, CCM 07/20/2015 3:18 PM   whats plan for follow-up?         Needs to be reassessed to make sure he does not return to CHF and to ensure stabilization.            ----- Message -----     From: [name], RN     Sent: 07/20/2015  4:12 PM      To: Hillis Range, MD           I think we can just see what the repeat transmission looks like.         ----- Message -----     From: [name], RN     Sent: 07/23/2015  9:06 AM      To: Hillis Range, MD        Repeat Corvue Transmission on 07/26/2015. Would you like for me to schedule BMET this week?         -----  Message -----     From: Hillis Range, MD     Sent: 07/22/2015  8:49 PM      To: [name], RN

## 2015-07-20 NOTE — Telephone Encounter (Signed)
Called pt to let him know that is echo the he had done was good.  Advised him that his pumping function was normal.  Also let him know that his labs were good and that his renal function was ok and Dayna advised him to continue with there Lasix and Potassium daily.  Pt verbalized understanding.

## 2015-07-25 LAB — CUP PACEART REMOTE DEVICE CHECK
Battery Remaining Longevity: 110 mo
Battery Remaining Percentage: 95.5 %
Battery Voltage: 3.01 V
Lead Channel Impedance Value: 860 Ohm
Lead Channel Pacing Threshold Amplitude: 1 V
Lead Channel Pacing Threshold Pulse Width: 0.4 ms
Lead Channel Sensing Intrinsic Amplitude: 12 mV
Lead Channel Setting Pacing Amplitude: 2 V
Lead Channel Setting Pacing Amplitude: 2 V
Lead Channel Setting Pacing Pulse Width: 0.4 ms
MDC IDC MSMT LEADCHNL RV IMPEDANCE VALUE: 560 Ohm
MDC IDC MSMT LEADCHNL RV PACING THRESHOLD AMPLITUDE: 0.625 V
MDC IDC MSMT LEADCHNL RV PACING THRESHOLD PULSEWIDTH: 0.4 ms
MDC IDC PG SERIAL: 7714572
MDC IDC SESS DTM: 20160923185519
MDC IDC SET LEADCHNL RV PACING PULSEWIDTH: 0.4 ms
MDC IDC SET LEADCHNL RV SENSING SENSITIVITY: 4 mV
Pulse Gen Model: 3242

## 2015-07-26 ENCOUNTER — Ambulatory Visit (INDEPENDENT_AMBULATORY_CARE_PROVIDER_SITE_OTHER): Payer: Medicare Other

## 2015-07-26 DIAGNOSIS — Z95810 Presence of automatic (implantable) cardiac defibrillator: Secondary | ICD-10-CM

## 2015-07-26 NOTE — Progress Notes (Addendum)
EPIC Encounter for ICM Monitoring  Patient Name: Leonard Massey is a 69 y.o. male Date: 07/26/2015 Primary Care Physican: Gust Rung, DO Primary Cardiologist: Allred Electrophysiologist: Allred Dry Weight: 117 lbs       In the past month, have you:  1. Gained more than 2 pounds in a day or more than 5 pounds in a week? Yes, gained 10 lbs.  Was 109 lbs on 07/20/2015.  Patient reported his appetite has returned and thinks it related to eating a lot more foods than fluid.  2. Had changes in your medications (with verification of current medications)? no  3. Had more shortness of breath than is usual for you? no  4. Limited your activity because of shortness of breath? no  5. Not been able to sleep because of shortness of breath? no  6. Had increased swelling in your feet or ankles? He has had slight swelling in his feet since starting Prednisone 2 months ago  7. Had symptoms of dehydration (dizziness, dry mouth, increased thirst, decreased urine output) no  8. Had changes in sodium restriction? Yes, patient not follow low salt diet.  Education given that foods high in sodium can cause fluid retention and recommendation is to limit salt to 2000 mg day.  Patient education mailed on Low Sodium Eating Plan.  He eats canned soups, liver pudding and lunch meat sandwiches such as bologna, ham.  Requested to check food labels to determine his current intake of sodium per day.   9. Been compliant with medication? Yes   ICM trend:   Follow-up plan: ICM clinic phone appointment 08/08/2015 for repeat follow up.  Corvue impedance reference line leveling out and daily impedance below baseline.  Patient does not think the weight gain or leg swelling is related to fluid retention.  Confirmed he stopped taking Lasix and Potassium as ordered on 07/20/2015 by Ronie Spies, NP.  Patient eating high sodium foods and stated he drinks a lot of fluids during the day including boost twice a day.  Denied any  difficulty breathing.  Taking prednisone for 2 months which was prescribed by Pulmonary physician.  He has an echo scheduled next week.                Advised patient to follow low salt diet, weight (117 lbs) is trending back toward baseline after diuresing (was 107 lbs on 07/17/2015).     Advised will forward to Dr Johney Frame for review and recommendations and will call him back with any recommendations.  Copy of note sent to patient's primary care physician, primary cardiologist, and device following physician.  [name], RN, CCM 07/26/2015 2:38 PM  Lets just repeat in 2 weeks         ----- Message -----     From: [name], RN     Sent: 07/26/2015  3:19 PM      To: Hillis Range, MD

## 2015-07-30 ENCOUNTER — Telehealth (HOSPITAL_COMMUNITY): Payer: Self-pay | Admitting: *Deleted

## 2015-07-30 NOTE — Telephone Encounter (Signed)
Patient given detailed instructions per Myocardial Perfusion Study Information Sheet for test on 08/01/15 at 1000. Patient notified to arrive 15 minutes early and that it is imperative to arrive on time for appointment to keep from having the test rescheduled.  If you need to cancel or reschedule your appointment, please call the office within 24 hours of your appointment. Failure to do so may result in a cancellation of your appointment, and a $50 no show fee. Patient verbalized understanding. Leonard Massey, Adelene Idler

## 2015-08-01 ENCOUNTER — Ambulatory Visit (HOSPITAL_COMMUNITY): Payer: Medicare Other | Attending: Physician Assistant

## 2015-08-01 DIAGNOSIS — I779 Disorder of arteries and arterioles, unspecified: Secondary | ICD-10-CM | POA: Insufficient documentation

## 2015-08-01 DIAGNOSIS — Z87891 Personal history of nicotine dependence: Secondary | ICD-10-CM | POA: Diagnosis not present

## 2015-08-01 DIAGNOSIS — I739 Peripheral vascular disease, unspecified: Secondary | ICD-10-CM | POA: Diagnosis not present

## 2015-08-01 DIAGNOSIS — Z8679 Personal history of other diseases of the circulatory system: Secondary | ICD-10-CM | POA: Diagnosis not present

## 2015-08-01 DIAGNOSIS — I482 Chronic atrial fibrillation: Secondary | ICD-10-CM

## 2015-08-01 DIAGNOSIS — I5031 Acute diastolic (congestive) heart failure: Secondary | ICD-10-CM | POA: Insufficient documentation

## 2015-08-01 DIAGNOSIS — I10 Essential (primary) hypertension: Secondary | ICD-10-CM | POA: Diagnosis not present

## 2015-08-01 DIAGNOSIS — I4821 Permanent atrial fibrillation: Secondary | ICD-10-CM

## 2015-08-01 DIAGNOSIS — R0602 Shortness of breath: Secondary | ICD-10-CM | POA: Diagnosis not present

## 2015-08-01 DIAGNOSIS — R079 Chest pain, unspecified: Secondary | ICD-10-CM | POA: Diagnosis not present

## 2015-08-01 DIAGNOSIS — Z950 Presence of cardiac pacemaker: Secondary | ICD-10-CM

## 2015-08-01 LAB — MYOCARDIAL PERFUSION IMAGING
CHL CUP NUCLEAR SDS: 0
CHL CUP RESTING HR STRESS: 70 {beats}/min
LV sys vol: 38 mL
LVDIAVOL: 87 mL
Peak HR: 71 {beats}/min
RATE: 0.22
SRS: 5
SSS: 5
TID: 1.01

## 2015-08-01 MED ORDER — TECHNETIUM TC 99M SESTAMIBI GENERIC - CARDIOLITE
10.9000 | Freq: Once | INTRAVENOUS | Status: AC | PRN
  Administered 2015-08-01: 10.9 via INTRAVENOUS

## 2015-08-01 MED ORDER — REGADENOSON 0.4 MG/5ML IV SOLN
0.4000 mg | Freq: Once | INTRAVENOUS | Status: AC
  Administered 2015-08-01: 0.4 mg via INTRAVENOUS

## 2015-08-01 MED ORDER — TECHNETIUM TC 99M SESTAMIBI GENERIC - CARDIOLITE
32.8000 | Freq: Once | INTRAVENOUS | Status: AC | PRN
  Administered 2015-08-01: 32.8 via INTRAVENOUS

## 2015-08-03 ENCOUNTER — Telehealth: Payer: Self-pay | Admitting: Internal Medicine

## 2015-08-03 NOTE — Telephone Encounter (Signed)
New message  ° ° °Patient calling for test results.   °

## 2015-08-03 NOTE — Telephone Encounter (Signed)
Patient called back about stress test results. Per Ronie Spies PA, Please let patient know study is low risk. F/u as planned. He would also benefit from referral to pulmonology for dyspnea if he is not already seeing one. Patient does see a pulmonologist in [place]. Patient verbalized understanding of results.

## 2015-08-07 ENCOUNTER — Encounter: Payer: Self-pay | Admitting: Cardiology

## 2015-08-08 ENCOUNTER — Telehealth: Payer: Self-pay | Admitting: Cardiology

## 2015-08-08 ENCOUNTER — Ambulatory Visit (INDEPENDENT_AMBULATORY_CARE_PROVIDER_SITE_OTHER): Payer: Medicare Other

## 2015-08-08 DIAGNOSIS — I5031 Acute diastolic (congestive) heart failure: Secondary | ICD-10-CM

## 2015-08-08 DIAGNOSIS — Z95810 Presence of automatic (implantable) cardiac defibrillator: Secondary | ICD-10-CM

## 2015-08-08 NOTE — Telephone Encounter (Signed)
Spoke with pt and reminded pt of remote transmission that is due today. Pt verbalized understanding.   

## 2015-08-08 NOTE — Progress Notes (Signed)
EPIC Encounter for ICM Monitoring  Patient Name: Leonard LeekJohn W Vales is a 69 y.o. male Date: 08/08/2015 Primary Care Physican: Gust RungHoffman, Erik C, DO Primary Cardiologist: Allred Electrophysiologist: Allred Dry Weight: 119 lb       In the past month, have you:  1. Gained more than 2 pounds in a day or more than 5 pounds in a week? no  2. Had changes in your medications (with verification of current medications)? no  3. Had more shortness of breath than is usual for you? no  4. Limited your activity because of shortness of breath? no  5. Not been able to sleep because of shortness of breath? no  6. Had increased swelling in your feet or ankles? no  7. Had symptoms of dehydration (dizziness, dry mouth, increased thirst, decreased urine output) no  8. Had changes in sodium restriction? no  9. Been compliant with medication? Yes   ICM trend: 08/08/2015     Follow-up plan: ICM clinic phone appointment on 08/21/2015.  Patient has office appointment on 08/22/2015.  Corvue impedance above baseline ~07/30/2015 to 07/31/2015; 08/02/2015 to 08/05/2015.  Advised to drink enough fluids to stay hydrated. He stated he is feeling fine.  No changes today.    Copy of note sent to patient's primary care physician, primary cardiologist, and device following physician.  Karie SodaLaurie S Gunnard Dorrance, RN, CCM 08/08/2015 1:19 PM

## 2015-08-21 ENCOUNTER — Ambulatory Visit (INDEPENDENT_AMBULATORY_CARE_PROVIDER_SITE_OTHER): Payer: Medicare Other

## 2015-08-21 ENCOUNTER — Encounter: Payer: Self-pay | Admitting: Physician Assistant

## 2015-08-21 DIAGNOSIS — Z95810 Presence of automatic (implantable) cardiac defibrillator: Secondary | ICD-10-CM | POA: Diagnosis not present

## 2015-08-21 DIAGNOSIS — I739 Peripheral vascular disease, unspecified: Secondary | ICD-10-CM | POA: Insufficient documentation

## 2015-08-21 NOTE — Progress Notes (Addendum)
Cardiology Office Note Date:  08/22/2015  Patient ID:  Duriel, Deery July 14, 1946, MRN [id number] PCP:  Gust Rung, DO  Cardiologist:  [name]  Chief Complaint: f/u dyspnea  History of Present Illness: AYSON CHERUBINI is a 69 y.o. male with history of HTN, permanent atrial fibrillation s/p St. Jude BiV pacemaker implanted by Dr. Bard Herbert at [facility] in 12/2014 with concomitant AVN ablation, h/o heavy tobacco use with advanced COPD, prior EtOH use, PVD followed by vascular surgery (prior carotid subclavian bypass for subclavian steal ~03, bilateral aortoiliac and superficial femoral occlusive disease with calf claudication being managed medically), possible diastolic CHF 06/2015 who presents for follow-up.   He was seen 07/05/15 by Robbie Lis PA-C with complaints of dyspnea, fluid retention (edema + orthopnea), and decreased exercise tolerance. He had been previously on Lasix but had self-discontinued this 2 days prior to appointment due to leg cramps. Weight was up 5lb. He was started back on Lasix  daily with additional potassium for suspected CHF. He reported compliance with Eliquis. 2D echo 07/09/15: EF 55-60% with normal LV wall thickness, no RWMA, mild MR, severe LAE, mod RAE, mild-mod increased PASP. I saw him back in clnic 07/17/15 at which time he stopped Lasix and potassium due to resolution of edema and development of a "rash" 3 days prior - he had rare scattered excoriations on his legs and one spot on his scalp but no papules, macules or erythema. He was still reporting dyspnea on exertion and decreased exercise tolerance. He had seen his PCP earlier that day who had diagnosed him with a lung infection and started antibiotics.  Weight was down 13lb and he did not appear volume overloaded. Device interrogation later correlated low volume status. Note that he had a CT chest in 02/2015 showing lung RADS Category 2, benign appearance or behavior (with recommendation for annual  screening with low-dose chest CT without contrast in 12 months), diffuse bronchial wall thickening with emphysema; imaging findings suggestive of underlying COPD and atherosclerotic disease including 3 vessel coronary artery calcification. He underwent Lexiscan nuclear stress testing 08/01/15 which was low risk, no reversible ischemia, EF 56%.  He comes in for follow-up today. He is feeling better since the last time I saw him. His breathing has improved. He still gets DOE with steep inclines but states this is back to baseline. No chest pain, orthopnea, LEE, bleeding. He gets occasional edema after a long day of working in the yard but it improves with elevation of his legs. He says his spirits are up and down lately as the woman he's lived with for 25 years is at [facility] being treated for a stroke. He inquires about nicotine patches.    Past Medical History  Diagnosis Date  . COPD (chronic obstructive pulmonary disease) (HCC)   . Depression   . GERD (gastroesophageal reflux disease)     Pt reports having EGD/colonoscopy in 2010 by Dr. [name] in [place] wich showed ulcers on uppper and normal colon. No report in emr.   . Hyperlipidemia     Pt typically has low HDL and low LDL.   [name] Essential hypertension   . Low back pain   . Cervical spondylosis   . Alcoholism (HCC)   . Subclavian steal syndrome     HX of.   . Right knee pain 8/10    Began to complain of after requesting a scooter.  Exam was negative and it was decided that pt should continue to  ambulate.   . Permanent atrial fibrillation (HCC)   . Complete heart block (HCC)     a. h/o permanent atrial fibrillation s/p St. Jude BiV pacemaker implanted by Dr. Bard Herbert at [facility] in 12/2014 with concomitant AVN ablation.  . Coronary artery calcification seen on CT scan 02/2015    a. Lexiscan nuclear stress testing 08/01/15 which was low risk, no reversible ischemia, EF 56%..  . Chronic diastolic CHF (congestive heart failure) (HCC)     . Tobacco abuse   . PVD (peripheral vascular disease) (HCC)     a. followed by vascular surgery (prior carotid subclavian bypass for subclavian steal ~03, bilateral aortoiliac and superficial femoral occlusive disease with calf claudication being managed medically.    Past Surgical History  Procedure Laterality Date  . Carotid endartectomy Left   . Left common carotid to subclavian artery bypass  04/1999  . Anterior cervical diskectomy and fusion at c4-5 and c5-6  2002    Dr. Wynetta Emery  . Pacemaker insertion  01/04/2015    SJM PM 3242 CRT-P implanted by Dr Sharol Harness at [facility] for AV block s/p AV nodal ablation  . Av nodal ablation  3/16    At [facility]    Current Outpatient Prescriptions  Medication Sig Dispense Refill  . albuterol (PROAIR HFA) 108 (90 BASE) MCG/ACT inhaler Inhale 1-2 puffs into the lungs every 6 (six) hours as needed for wheezing or shortness of breath.    [name] apixaban (ELIQUIS) 5 MG TABS tablet Take 1 tablet (5 mg total) by mouth 2 (two) times daily. 60 tablet 11  . aspirin (ASPIRIN EC) 81 MG EC tablet Take 81 mg by mouth daily. Swallow whole.    . budesonide (PULMICORT) 0.5 MG/2ML nebulizer solution INHALE 1 VIAL VIA NEBULIZER PO BID  3  . HYDROcodone-acetaminophen (NORCO) 10-325 MG per tablet Take 1 tablet by mouth every 8 (eight) hours as needed for severe pain. 90 tablet 0  . ipratropium-albuterol (DUONEB) 0.5-2.5 (3) MG/3ML SOLN Inhale the contents of 1 vial by nebulization four times a day  3  . levofloxacin (LEVAQUIN) 500 MG tablet Take 1 tablet (500 mg total) by mouth daily. 7 tablet 0  . metoprolol succinate (TOPROL XL) 100 MG 24 hr tablet Take 1 tablet (100 mg total) by mouth daily. Take with or immediately following a meal. 90 tablet 3  . predniSONE (DELTASONE) 10 MG tablet Take 40 mg by mouth daily with breakfast.    . sertraline (ZOLOFT) 50 MG tablet TAKE 1 TABLET BY MOUTH DAILY 90 tablet 3  . simvastatin (ZOCOR) 40 MG tablet Take 1 tablet (40 mg total)  by mouth at bedtime. 90 tablet 3  . SYMBICORT 160-4.5 MCG/ACT inhaler INHALE 2 PUFFS PO BID  3  . nicotine (NICODERM CQ - DOSED IN MG/24 HOURS) 21 mg/24hr patch Use  patch one daily for 6 weeks (dispense #42), then  patch one daily for 2 weeks (dispense #14), then  patch one daily for 2 weeks (dispense #14). Remove old patch before applying new one. 70 patch 0   No current facility-administered medications for this visit.    Allergies:   Aspirin   Social History:  The patient  reports that he has been smoking Cigarettes.  He started smoking about 2 years ago. He has a 75 pack-year smoking history. He quit smokeless tobacco use about 3 years ago. He reports that he does not drink alcohol.   Family History:  The patient's family history includes Heart attack in  his mother and sister; Heart failure in his mother; Hypertension in his sister. There is no history of Stroke.  ROS:  Please see the history of present illness.  All other systems are reviewed and otherwise negative.   PHYSICAL EXAM:  VS:  BP 108/82 mmHg  Pulse 70  Ht 5\' 6"  (1.676 m)  Wt 112 lb 12.8 oz (51.166 kg)  BMI 18.22 kg/m2  SpO2 96% BMI: Body mass index is 18.22 kg/(m^2). Frail appearing WM in no acute distress, appears older than stated age HEENT: normocephalic, atraumatic Neck: no JVD, carotid bruits or masses Cardiac:  normal S1, S2; RRR; no murmurs, rubs, or gallops Lungs:  diffusely diminished bilaterally; no wheezing, rhonchi or rales Abd: soft, nontender, no hepatomegaly, + BS MS: no deformity or atrophy Ext: no edema Skin: warm and dry, no rash Neuro:  moves all extremities spontaneously, no focal abnormalities noted, follows commands Psych: euthymic mood, full affect   Recent Labs: 07/17/2015: ALT 17; TSH 2.50 07/19/2015: B Natriuretic Peptide 670.3*; BUN 23*; Creatinine, Ser 0.70; Hemoglobin 12.7*; Platelets 329; Potassium 4.9; Sodium 141  09/07/2014: Cholesterol 129; HDL 48; LDL Cholesterol 64;  Total CHOL/HDL Ratio 2.7; Triglycerides 83; VLDL 17   CrCl cannot be calculated (Patient has no serum creatinine result on file.).   Wt Readings from Last 3 Encounters:  08/22/15 112 lb 12.8 oz (51.166 kg)  08/01/15 107 lb (48.535 kg)  07/19/15 107 lb (48.535 kg)     Other studies reviewed: Additional studies/records reviewed today include: summarized above  ASSESSMENT AND PLAN:  1. Dyspnea - back to baseline. Suspect chronic DOE r/t significant COPD. He is [place] by pulmonology in [place]. He did have coronary calcification on prior CT scan but had negative nuclear stress test last month. Continue risk factor modification. Hyperlipidemia is followed by PCP (LDL was 64 in 08/2014). I noticed aspirin is now on the patient's med list where it wasn't before - he says he's always taken a baby aspirin. He has not experienced any bleeding. Will defer decision to continue to Dr. Johney FrameAllred when he sees in follow-up (he has known concomitant PVD, but is also on Eliquis for AF). 2. Chronic diastolic CHF - appears euvolemic. Continue current regimen. Added Lasix to h/o allergies but I am still not totally convinced his prior rash was a drug rash - did not have that appearance. 3. COPD with ongoing tobacco abuse - as above. 4. Permanent atrial fib s/p AVN ablation and pacemaker - keep f/u with Dr. Johney FrameAllred as planned. 5. Tobacco abuse - I am so proud that the patient took it upon himself to ask for nicotine patches right before I was about to advise cessation again. Will prescribe. If this does not work, would consider an alternative agent like Chantix or Zyban since he seems motivated to quit.  Disposition: F/u with Dr. Johney FrameAllred 09/2015 as scheduled.  Current medicines are reviewed at length with the patient today.  The patient did not have any concerns regarding medicines.  Thomasene MohairSigned, Dayna Dunn PA-C 08/22/2015 11:09 AM     [facility] [address] [place] [address] [phone]  [phone] (office)  [phone] (fax)

## 2015-08-22 ENCOUNTER — Encounter: Payer: Self-pay | Admitting: Physician Assistant

## 2015-08-22 ENCOUNTER — Ambulatory Visit (INDEPENDENT_AMBULATORY_CARE_PROVIDER_SITE_OTHER): Payer: Medicare Other | Admitting: Physician Assistant

## 2015-08-22 VITALS — BP 108/82 | HR 70 | Ht 66.0 in | Wt 112.8 lb

## 2015-08-22 DIAGNOSIS — I6522 Occlusion and stenosis of left carotid artery: Secondary | ICD-10-CM

## 2015-08-22 DIAGNOSIS — Z950 Presence of cardiac pacemaker: Secondary | ICD-10-CM

## 2015-08-22 DIAGNOSIS — E785 Hyperlipidemia, unspecified: Secondary | ICD-10-CM

## 2015-08-22 DIAGNOSIS — I5032 Chronic diastolic (congestive) heart failure: Secondary | ICD-10-CM

## 2015-08-22 DIAGNOSIS — J449 Chronic obstructive pulmonary disease, unspecified: Secondary | ICD-10-CM

## 2015-08-22 DIAGNOSIS — I482 Chronic atrial fibrillation: Secondary | ICD-10-CM | POA: Diagnosis not present

## 2015-08-22 DIAGNOSIS — I4821 Permanent atrial fibrillation: Secondary | ICD-10-CM

## 2015-08-22 DIAGNOSIS — R0600 Dyspnea, unspecified: Secondary | ICD-10-CM

## 2015-08-22 DIAGNOSIS — Z8679 Personal history of other diseases of the circulatory system: Secondary | ICD-10-CM

## 2015-08-22 DIAGNOSIS — Z720 Tobacco use: Secondary | ICD-10-CM

## 2015-08-22 MED ORDER — NICOTINE 21 MG/24HR TD PT24: MEDICATED_PATCH | TRANSDERMAL | Status: DC

## 2015-08-22 MED ORDER — NICOTINE 21 MG/24HR TD PT24
MEDICATED_PATCH | TRANSDERMAL | Status: DC
Start: 2015-08-22 — End: 2015-12-27

## 2015-08-22 NOTE — Patient Instructions (Addendum)
Medication Instructions:  1) START using Nicotine Patches- use 21mg  patch one daily for 6 weeks, then 14mg  patch one daily for 2 weeks and then 7mg  patch one daily for 2 weeks.  Remove old patch before applying new one.   Labwork: none  Testing/Procedures: None  Follow-Up: Your physician recommends that you keep you follow-up appointment in: December 2016 with Dr. Johney FrameAllred.    Any Other Special Instructions Will Be Listed Below (If Applicable).     If you need a refill on your cardiac medications before your next appointment, please call your pharmacy.

## 2015-08-22 NOTE — Progress Notes (Signed)
EPIC Encounter for ICM Monitoring  Patient Name: Leonard LeekJohn W Massey is a 69 y.o. male Date: 08/22/2015 Primary Care Physican: Gust RungHoffman, Erik C, DO Primary Cardiologist: Allred Electrophysiologist: Allred Dry Weight: 112 lb       In the past month, have you:  1. Gained more than 2 pounds in a day or more than 5 pounds in a week? no  2. Had changes in your medications (with verification of current medications)? Yes, nicotine patch  3. Had more shortness of breath than is usual for you? no  4. Limited your activity because of shortness of breath? no  5. Not been able to sleep because of shortness of breath? no  6. Had increased swelling in your feet or ankles? no  7. Had symptoms of dehydration (dizziness, dry mouth, increased thirst, decreased urine output) no  8. Had changes in sodium restriction? no  9. Been compliant with medication? Yes   ICM trend: 08/21/2015   Follow-up plan: ICM clinic phone appointment on 09/25/2015.  Corvue impedance below baseline 08/14/2015 to 08/18/2015 and patient denied any symptoms.  He reported if walks for exercise then he may have a little ankle swelling but it resolves. He had office visit today with D. Dunn PA-C.  No changes today. Patient reported he is feeling better.   Copy of note sent to patient's primary care physician, primary cardiologist, and device following physician.  Karie SodaLaurie S Short, RN, CCM 08/22/2015 4:16 PM

## 2015-09-13 ENCOUNTER — Encounter: Payer: Self-pay | Admitting: Internal Medicine

## 2015-09-13 ENCOUNTER — Ambulatory Visit (INDEPENDENT_AMBULATORY_CARE_PROVIDER_SITE_OTHER): Payer: Medicare Other | Admitting: Internal Medicine

## 2015-09-13 VITALS — BP 102/61 | HR 85 | Temp 97.8°F | Resp 18 | Wt 112.1 lb

## 2015-09-13 DIAGNOSIS — I482 Chronic atrial fibrillation: Secondary | ICD-10-CM | POA: Diagnosis not present

## 2015-09-13 DIAGNOSIS — G894 Chronic pain syndrome: Secondary | ICD-10-CM | POA: Diagnosis not present

## 2015-09-13 DIAGNOSIS — I4821 Permanent atrial fibrillation: Secondary | ICD-10-CM

## 2015-09-13 DIAGNOSIS — I10 Essential (primary) hypertension: Secondary | ICD-10-CM | POA: Diagnosis present

## 2015-09-13 DIAGNOSIS — F17200 Nicotine dependence, unspecified, uncomplicated: Secondary | ICD-10-CM

## 2015-09-13 DIAGNOSIS — Z7901 Long term (current) use of anticoagulants: Secondary | ICD-10-CM | POA: Diagnosis not present

## 2015-09-13 DIAGNOSIS — R9389 Abnormal findings on diagnostic imaging of other specified body structures: Secondary | ICD-10-CM

## 2015-09-13 DIAGNOSIS — F17210 Nicotine dependence, cigarettes, uncomplicated: Secondary | ICD-10-CM | POA: Diagnosis not present

## 2015-09-13 MED ORDER — NICOTINE POLACRILEX 2 MG MT LOZG: 2.0000 mg | LOZENGE | OROMUCOSAL | Status: DC | PRN

## 2015-09-13 MED ORDER — HYDROCODONE-ACETAMINOPHEN 10-325 MG PO TABS: 1.0000 | ORAL_TABLET | Freq: Three times a day (TID) | ORAL | Status: DC | PRN

## 2015-09-13 NOTE — Progress Notes (Signed)
[facility] Subjective:   Patient ID: Leonard Massey male   DOB: 05-25-1946 69 y.o.   MRN: [id number]  HPI: [name] Caine is a 69 y.o. male with a PMH detailed below who presents for 3 month follow up of his HTN, Afib and chronic pain.  Please see A&P below for further details on the status of his chronic medical problems.    Past Medical History  Diagnosis Date  . COPD (chronic obstructive pulmonary disease) (HCC)   . Depression   . GERD (gastroesophageal reflux disease)     Pt reports having EGD/colonoscopy in 2010 by Dr. [name] in [place] wich showed ulcers on uppper and normal colon. No report in emr.   . Hyperlipidemia     Pt typically has low HDL and low LDL.   [name] Essential hypertension   . Low back pain   . Cervical spondylosis   . Alcoholism (HCC)   . Subclavian steal syndrome     HX of.   . Right knee pain 8/10    Began to complain of after requesting a scooter.  Exam was negative and it was decided that pt should continue to ambulate.   . Permanent atrial fibrillation (HCC)   . Complete heart block (HCC)     a. h/o permanent atrial fibrillation s/p St. Jude BiV pacemaker implanted by Dr. Bard Herbert at [facility] in 12/2014 with concomitant AVN ablation.  . Coronary artery calcification seen on CT scan 02/2015    a. Lexiscan nuclear stress testing 08/01/15 which was low risk, no reversible ischemia, EF 56%..  . Chronic diastolic CHF (congestive heart failure) (HCC)   . Tobacco abuse   . PVD (peripheral vascular disease) (HCC)     a. followed by vascular surgery (prior carotid subclavian bypass for subclavian steal ~03, bilateral aortoiliac and superficial femoral occlusive disease with calf claudication being managed medically.   Current Outpatient Prescriptions  Medication Sig Dispense Refill  . albuterol (PROAIR HFA) 108 (90 BASE) MCG/ACT inhaler Inhale 1-2 puffs into the lungs every 6 (six) hours as needed for wheezing or shortness of breath.    .  ALPRAZolam (XANAX) 0.25 MG tablet TK 1 T PO TID  3  . apixaban (ELIQUIS) 5 MG TABS tablet Take 1 tablet (5 mg total) by mouth 2 (two) times daily. 60 tablet 11  . aspirin (ASPIRIN EC) 81 MG EC tablet Take 81 mg by mouth daily. Swallow whole.    . budesonide (PULMICORT) 0.5 MG/2ML nebulizer solution INHALE 1 VIAL VIA NEBULIZER PO BID  3  . HYDROcodone-acetaminophen (NORCO) 10-325 MG tablet Take 1 tablet by mouth every 8 (eight) hours as needed for severe pain. 90 tablet 0  . ipratropium-albuterol (DUONEB) 0.5-2.5 (3) MG/3ML SOLN Inhale the contents of 1 vial by nebulization four times a day  3  . metoprolol succinate (TOPROL XL) 100 MG 24 hr tablet Take 1 tablet (100 mg total) by mouth daily. Take with or immediately following a meal. 90 tablet 3  . nicotine (NICODERM CQ - DOSED IN MG/24 HOURS) 21 mg/24hr patch Use  patch one daily for 6 weeks (dispense #42), then  patch one daily for 2 weeks (dispense #14), then  patch one daily for 2 weeks (dispense #14). Remove old patch before applying new one. 70 patch 0  . nicotine polacrilex (COMMIT) 2 MG lozenge Take 1 lozenge (2 mg total) by mouth as needed for smoking cessation. 100 tablet 0  . sertraline (ZOLOFT) 50 MG tablet TAKE  1 TABLET BY MOUTH DAILY 90 tablet 3  . simvastatin (ZOCOR) 40 MG tablet Take 1 tablet (40 mg total) by mouth at bedtime. 90 tablet 3  . SYMBICORT 160-4.5 MCG/ACT inhaler INHALE 2 PUFFS PO BID  3   No current facility-administered medications for this visit.   Family History  Problem Relation Age of Onset  . Heart failure Mother   . Hypertension Sister   . Heart attack Sister   . Heart attack Mother   . Stroke Neg Hx    Social History   Social History  . Marital Status: Divorced    Spouse Name: [name]  . Number of Children: [name]  . Years of Education: [name]   Social History Main Topics  . Smoking status: Current Some Day Smoker -- 1.50 packs/day for 50 years    Types: Cigarettes    Start date: 07/15/2013     Last Attempt to Quit: 08/14/2013  . Smokeless tobacco: Former NeurosurgeonUser    Quit date: 02/01/2012     Comment: Using E-cigs. pt smoking about 3 cigarettes a month 04-12-15  . Alcohol Use: No  . Drug Use: None  . Sexual Activity: Not Asked   Other Topics Concern  . None   Social History Narrative   Disabled several years, [place].   Lives with disabled girlfriend in a trailer .  Works around [employer] house and keeps busy.    Review of Systems: Review of Systems  Constitutional: Negative for fever and chills.  Respiratory: Negative for cough and shortness of breath.   Cardiovascular: Negative for chest pain, palpitations and leg swelling.  Gastrointestinal: Negative for heartburn.  Genitourinary: Negative for dysuria.  Musculoskeletal: Positive for back pain and neck pain.  Neurological: Negative for weakness and headaches.  Psychiatric/Behavioral: Positive for depression (partner he lives with had a stroke and is now in rehab). Negative for suicidal ideas.    Objective:  Physical Exam: Filed Vitals:   09/13/15 1446  BP: 102/61  Pulse: 85  Temp: 97.8 F (36.6 C)  TempSrc: Oral  Resp: 18  Weight: 112 lb 1.6 oz (50.848 kg)  SpO2: 97%   Physical Exam  Constitutional: He is well-developed, well-nourished, and in no distress.  HENT:  Head: Normocephalic and atraumatic.  Eyes:  Left pupil aniscoria (chronic)  Cardiovascular: Normal heart sounds.   irr irr  Pulmonary/Chest: Effort normal and breath sounds normal. He has no wheezes. He has no rales.  Abdominal: Soft. Bowel sounds are normal.  Musculoskeletal: He exhibits no edema.  Nursing note and vitals reviewed.   Assessment & Plan:  Case discussed with Dr. Heide SparkNarendra   Permanent atrial fibrillation Stable Continue rate control with Toprol XL 100mg  daily and Eliquis for A/C.  He does also take a 81mg  ASA and has not had any significant bleeding.  Tobacco use disorder He reports that he is intermittently  smoking cigerettes and using e-cigs.  He would like to stop completely.  Cardiology recently prescribed him nicotine patches but he was unable to afford this.  I have given him information today about the [place] quit line and he will check to see if they have assistance programs.  I have also added Nicotine losanges prescription and instructed him on how to use this.  Total time spent on tobacco use consuling was 3 minutes.  Chronic pain syndrome He takes Norco 10-325 for chronic back, neck, and knee pain.  He reports he does take about 3 pills a day, he does not  exceed this.  He has not had any red flags.  We did not obtain a urine specimen today but will need to at this next visit. -Given 3 month refill  Of note his pulmonogist prescribed a low does of Xanax to help with sleep.  I cautioned him about combining xanax and pain medications.  Essential hypertension BP at goal.  Continue metoprolol.    Medications Ordered Meds ordered this encounter  Medications  . ALPRAZolam (XANAX) 0.25 MG tablet    Sig: TK 1 T PO TID    Refill:  3  . DISCONTD: HYDROcodone-acetaminophen (NORCO) 10-325 MG tablet    Sig: Take 1 tablet by mouth every 8 (eight) hours as needed for severe pain.    Dispense:  90 tablet    Refill:  0    Rx 1/3  . DISCONTD: HYDROcodone-acetaminophen (NORCO) 10-325 MG tablet    Sig: Take 1 tablet by mouth every 8 (eight) hours as needed for severe pain.    Dispense:  90 tablet    Refill:  0    Rx 2/3 To be filled 30 days after print date  . HYDROcodone-acetaminophen (NORCO) 10-325 MG tablet    Sig: Take 1 tablet by mouth every 8 (eight) hours as needed for severe pain.    Dispense:  90 tablet    Refill:  0    Rx 3/3 To be filled 60 days after print date  . nicotine polacrilex (COMMIT) 2 MG lozenge    Sig: Take 1 lozenge (2 mg total) by mouth as needed for smoking cessation.    Dispense:  100 tablet    Refill:  0   Other Orders No orders of the defined types were placed in  this encounter.   Follow Up: Return in about 3 months (around 12/14/2015).

## 2015-09-13 NOTE — Patient Instructions (Signed)
General Instructions:  Continue to work on quitting smoking.  Thank you for bringing your medicines today. This helps us keep you safe from mistakes.   Progress Toward Treatment Goals:  Treatment Goal 09/13/2015  Blood pressure at goal  Stop smoking smoking less  Prevent falls -    Self Care Goals & Plans:  Self Care Goal 09/13/2015  Manage my medications take my medicines as prescribed; bring my medications to every visit; refill my medications on time; follow the sick day instructions if I am sick  Monitor my health keep track of my blood pressure  Eat healthy foods eat foods that are low in salt; eat more vegetables; drink diet soda or water instead of juice or soda  Be physically active take a walk every day; find a convenient safe place to exercise  Stop smoking go to the [employer] website ([employer].com.eewww.quitlinenc.com); call [employer] (1-800-QUIT-NOW)  Meeting treatment goals -    No flowsheet data found.   Care Management & Community Referrals:  Referral 09/13/2015  Referrals made for care management support -  Referrals made to community resources nutrition

## 2015-09-16 NOTE — Assessment & Plan Note (Addendum)
He takes Norco 10-325 for chronic back, neck, and knee pain.  He reports he does take about 3 pills a day, he does not exceed this.  He has not had any red flags.  We did not obtain a urine specimen today but will need to at this next visit. -Given 3 month refill  Of note his pulmonogist prescribed a low does of Xanax to help with sleep.  I cautioned him about combining xanax and pain medications.

## 2015-09-16 NOTE — Assessment & Plan Note (Signed)
Stable Continue rate control with Toprol XL 100mg  daily and Eliquis for A/C.  He does also take a 81mg  ASA and has not had any significant bleeding.

## 2015-09-16 NOTE — Assessment & Plan Note (Signed)
He reports that he is intermittently smoking cigerettes and using e-cigs.  He would like to stop completely.  Cardiology recently prescribed him nicotine patches but he was unable to afford this.  I have given him information today about the [place] quit line and he will check to see if they have assistance programs.  I have also added Nicotine losanges prescription and instructed him on how to use this.  Total time spent on tobacco use consuling was 3 minutes.

## 2015-09-16 NOTE — Assessment & Plan Note (Signed)
BP at goal.  Continue metoprolol.

## 2015-09-17 NOTE — Progress Notes (Signed)
Internal Medicine Clinic Attending  Case discussed with Dr. Hoffman soon after the resident saw the patient.  We reviewed the resident's history and exam and pertinent patient test results.  I agree with the assessment, diagnosis, and plan of care documented in the resident's note. 

## 2015-09-25 ENCOUNTER — Telehealth: Payer: Self-pay | Admitting: Cardiology

## 2015-09-25 ENCOUNTER — Ambulatory Visit (INDEPENDENT_AMBULATORY_CARE_PROVIDER_SITE_OTHER): Payer: Medicare Other

## 2015-09-25 DIAGNOSIS — Z95810 Presence of automatic (implantable) cardiac defibrillator: Secondary | ICD-10-CM | POA: Diagnosis not present

## 2015-09-25 DIAGNOSIS — I5032 Chronic diastolic (congestive) heart failure: Secondary | ICD-10-CM | POA: Diagnosis not present

## 2015-09-25 NOTE — Telephone Encounter (Signed)
Spoke with pt and reminded pt of remote transmission that is due today. Pt verbalized understanding.   

## 2015-09-26 NOTE — Progress Notes (Addendum)
EPIC Encounter for ICM Monitoring  Patient Name: Leonard LeekJohn W Massey is a 69 y.o. male Date: 09/26/2015 Primary Care Physican: Gust RungHoffman, Erik C, DO Primary Cardiologist: Allred Electrophysiologist: Allred Dry Weight: 118 lb       In the past month, have you:  1. Gained more than 2 pounds in a day or more than 5 pounds in a week? no  2. Had changes in your medications (with verification of current medications)? no  3. Had more shortness of breath than is usual for you? no  4. Limited your activity because of shortness of breath? no  5. Not been able to sleep because of shortness of breath? no  6. Had increased swelling in your feet or ankles? no  7. Had symptoms of dehydration (dizziness, dry mouth, increased thirst, decreased urine output) no  8. Had changes in sodium restriction? no  9. Been compliant with medication? Yes   ICM trend:   Follow-up plan: ICM clinic phone appointment 10/03/2015.  Corvue daily impedance below baseline for last 9 days.  He reported he chronically has SOB.  He denied any HF symptoms at this time. He does eat foods high in sodium and reminded him to limit salt intake to 2000mg  daily.  He does not take Furosemide or Potassium on regular basis.  He stated the Furosemide can make him feel nauseated at times.         Optivol reviewed with Dr Johney FrameAllred.  He recommended patient take 1 tablet of Furosemide 20 mg daily x 2 days and 1 tablet of Potassium 20 meq daily x 2 days.  Will repeat Optivol on 10/03/2015.          Patient notified of Dr Jenel LucksAllred's order to Take 1 tablet of Furosemide daily x 2 days and 1 tablet of Potassium 20 meq daily x 2 days starting on 09/27/2015.  Advised to call the office should he have any problems with nausea,        vomiting, weakness or other side effects prior to taking medications the 2nd day.  He verbalized understanding.   Copy of note sent to patient's primary care physician, primary cardiologist, and device following  physician.  Karie SodaLaurie S Celene Pippins, RN, CCM 09/26/2015 4:22 PM

## 2015-10-03 ENCOUNTER — Telehealth: Payer: Self-pay

## 2015-10-03 ENCOUNTER — Ambulatory Visit (INDEPENDENT_AMBULATORY_CARE_PROVIDER_SITE_OTHER): Payer: Medicare Other

## 2015-10-03 DIAGNOSIS — I5032 Chronic diastolic (congestive) heart failure: Secondary | ICD-10-CM

## 2015-10-03 DIAGNOSIS — Z95810 Presence of automatic (implantable) cardiac defibrillator: Secondary | ICD-10-CM

## 2015-10-03 NOTE — Telephone Encounter (Signed)
Repeat ICM transmission received.  Attempted call and left message to return call.

## 2015-10-04 NOTE — Telephone Encounter (Signed)
Spoke to patient

## 2015-10-04 NOTE — Progress Notes (Signed)
EPIC Encounter for ICM Monitoring  Patient Name: Leonard Massey is a 69 y.o. male Date: 10/04/2015 Primary Care Physican: Gust RungHoffman, Erik C, DO Primary Cardiologist: Allred Electrophysiologist: Allred Dry Weight: 118 lb   Bi-V Pacing >99%      In the past month, have you:  1. Gained more than 2 pounds in a day or more than 5 pounds in a week? no  2. Had changes in your medications (with verification of current medications)? no  3. Had more shortness of breath than is usual for you? no  4. Limited your activity because of shortness of breath? no  5. Not been able to sleep because of shortness of breath? no  6. Had increased swelling in your feet or ankles? no  7. Had symptoms of dehydration (dizziness, dry mouth, increased thirst, decreased urine output) no  8. Had changes in sodium restriction? no  9. Been compliant with medication? Yes   ICM trend: 3 month view 10/03/2015   Follow-up plan: ICM clinic phone appointment on 11/06/2015.  Appointment with Dr Johney FrameAllred 10/10/2015.  Repeat Corvue after taking prn dose of Furosemide x 2 days, 12/1 and 12/2.  Impedance above baseline 09/28/2015 to 10/01/2015 and returned to baseline 10/01/2015.  He stated he feels fine and denied any HF symptoms.  Weight remains stable at 118 lbs.  No changes today.   Copy of note sent to patient's primary care physician, primary cardiologist, and device following physician.  Karie SodaLaurie S Short, RN, CCM 10/04/2015 11:07 AM

## 2015-10-10 ENCOUNTER — Ambulatory Visit (INDEPENDENT_AMBULATORY_CARE_PROVIDER_SITE_OTHER): Payer: Medicare Other | Admitting: Internal Medicine

## 2015-10-10 ENCOUNTER — Encounter: Payer: Self-pay | Admitting: Internal Medicine

## 2015-10-10 ENCOUNTER — Other Ambulatory Visit: Payer: Self-pay

## 2015-10-10 VITALS — BP 108/80 | HR 77 | Ht 66.0 in | Wt 112.6 lb

## 2015-10-10 DIAGNOSIS — I482 Chronic atrial fibrillation: Secondary | ICD-10-CM

## 2015-10-10 DIAGNOSIS — I739 Peripheral vascular disease, unspecified: Secondary | ICD-10-CM | POA: Diagnosis not present

## 2015-10-10 DIAGNOSIS — I10 Essential (primary) hypertension: Secondary | ICD-10-CM | POA: Diagnosis not present

## 2015-10-10 DIAGNOSIS — I442 Atrioventricular block, complete: Secondary | ICD-10-CM | POA: Diagnosis not present

## 2015-10-10 DIAGNOSIS — I6522 Occlusion and stenosis of left carotid artery: Secondary | ICD-10-CM | POA: Diagnosis not present

## 2015-10-10 DIAGNOSIS — I4821 Permanent atrial fibrillation: Secondary | ICD-10-CM

## 2015-10-10 LAB — CUP PACEART INCLINIC DEVICE CHECK
Brady Statistic RA Percent Paced: 0 %
Brady Statistic RV Percent Paced: 99.59 %
Implantable Lead Implant Date: 20160310
Implantable Lead Location: 753860
Lead Channel Impedance Value: 900 Ohm
Lead Channel Pacing Threshold Amplitude: 0.625 V
Lead Channel Pacing Threshold Amplitude: 1 V
Lead Channel Sensing Intrinsic Amplitude: 12 mV
Lead Channel Setting Sensing Sensitivity: 4 mV
MDC IDC LEAD IMPLANT DT: 20160310
MDC IDC LEAD LOCATION: 753858
MDC IDC MSMT BATTERY REMAINING LONGEVITY: 104.4
MDC IDC MSMT BATTERY VOLTAGE: 3.01 V
MDC IDC MSMT LEADCHNL LV PACING THRESHOLD PULSEWIDTH: 0.4 ms
MDC IDC MSMT LEADCHNL RV IMPEDANCE VALUE: 550 Ohm
MDC IDC MSMT LEADCHNL RV PACING THRESHOLD PULSEWIDTH: 0.4 ms
MDC IDC PG MODEL: 3242
MDC IDC PG SERIAL: 7714572
MDC IDC SESS DTM: 20161214165853
MDC IDC SET LEADCHNL LV PACING AMPLITUDE: 2 V
MDC IDC SET LEADCHNL LV PACING PULSEWIDTH: 0.4 ms
MDC IDC SET LEADCHNL RV PACING AMPLITUDE: 2 V
MDC IDC SET LEADCHNL RV PACING PULSEWIDTH: 0.4 ms

## 2015-10-10 MED ORDER — VARENICLINE TARTRATE 0.5 MG X 11 & 1 MG X 42 PO MISC: ORAL | Status: DC

## 2015-10-10 NOTE — Progress Notes (Signed)
Electrophysiology Office Note   Date:  10/10/2015   ID:  Massey, Leonard Feb 03, 1946, MRN [id number]  PCP:  Leonard Rung, DO  Cardiologist:  Previously at [facility] Primary Electrophysiologist: Leonard Range, MD    Chief Complaint  Patient presents with  . Permanent AFIB  . CHB  . PVD     History of Present Illness: Leonard Massey is a 69 y.o. male who presents today for electrophysiology evaluation.   He has atrial fibrillation and a SJM BiV pacemaker which was implanted by Dr Bard Herbert at [facility] 9 months ago with concomitant AV nodal ablation.  He has a h/o heavy tobacco use and prior ETOh use.  He is primarily limited by DDD/DJD and has advanced COPD.  He also had PVD for which he is followed by vascular surgery.  He seems to be doing well from a CV standpoint.  Today, he denies symptoms of palpitations, chest pain,  orthopnea, PND, lower extremity edema, claudication, dizziness, presyncope, syncope, bleeding, or neurologic sequela. The patient is tolerating medications without difficulties and is otherwise without complaint today.    Past Medical History  Diagnosis Date  . COPD (chronic obstructive pulmonary disease) (HCC)   . Depression   . GERD (gastroesophageal reflux disease)     Pt reports having EGD/colonoscopy in 2010 by Dr. Chales Massey in [place] wich showed ulcers on uppper and normal colon. No report in emr.   . Hyperlipidemia     Pt typically has low HDL and low LDL.   Leonard Massey Essential hypertension   . Low back pain   . Cervical spondylosis   . Alcoholism (HCC)   . Subclavian steal syndrome     HX of.   . Right knee pain 8/10    Began to complain of after requesting a scooter.  Exam was negative and it was decided that pt should continue to ambulate.   . Permanent atrial fibrillation (HCC)   . Complete heart block (HCC)     a. h/o permanent atrial fibrillation s/p [employer] BiV pacemaker implanted by Dr. Bard Herbert at [facility] in 12/2014 with concomitant AVN ablation.  .  Coronary artery calcification seen on CT scan 02/2015    a. Lexiscan nuclear stress testing 08/01/15 which was low risk, no reversible ischemia, EF 56%..  . Chronic diastolic CHF (congestive heart failure) (HCC)   . Tobacco abuse   . PVD (peripheral vascular disease) (HCC)     a. followed by vascular surgery (prior carotid subclavian bypass for subclavian steal ~03, bilateral aortoiliac and superficial femoral occlusive disease with calf claudication being managed medically.   Past Surgical History  Procedure Laterality Date  . Carotid endartectomy Left   . Left common carotid to subclavian artery bypass  04/1999  . Anterior cervical diskectomy and fusion at c4-5 and c5-6  2002    Dr. Wynetta Emery  . Pacemaker insertion  01/04/2015    SJM PM 3242 CRT-P implanted by Dr Sharol Harness at [facility] for AV block s/p AV nodal ablation  . Av nodal ablation  3/16    At [facility]     Current Outpatient Prescriptions  Medication Sig Dispense Refill  . albuterol (PROAIR HFA) 108 (90 BASE) MCG/ACT inhaler Inhale 1-2 puffs into the lungs every 6 (six) hours as needed for wheezing or shortness of breath.    . ALPRAZolam (XANAX) 0.25 MG tablet Take 1 tablet by mouth three times a day  3  . apixaban (ELIQUIS) 5 MG TABS tablet Take 1  tablet (5 mg total) by mouth 2 (two) times daily. 60 tablet 11  . aspirin (ASPIRIN EC) 81 MG EC tablet Take 81 mg by mouth daily. Swallow whole.    . budesonide (PULMICORT) 0.5 MG/2ML nebulizer solution INHALE 1 VIAL VIA NEBULIZER TWICE DAILY  3  . furosemide (LASIX) 40 MG tablet Take 1 tablet by mouth daily as needed. Swelling  1  . HYDROcodone-acetaminophen (NORCO) 10-325 MG tablet Take 1 tablet by mouth every 8 (eight) hours as needed for severe pain. 90 tablet 0  . ipratropium-albuterol (DUONEB) 0.5-2.5 (3) MG/3ML SOLN Inhale the contents of 1 vial by nebulization four times a day  3  . metoprolol succinate (TOPROL XL) 100 MG 24 hr tablet Take 1 tablet (100 mg total) by mouth  daily. Take with or immediately following a meal. 90 tablet 3  . nicotine (NICODERM CQ - DOSED IN MG/24 HOURS) 21 mg/24hr patch Use 21mg  patch one daily for 6 weeks (dispense #42), then 14mg  patch one daily for 2 weeks (dispense #14), then 7mg  patch one daily for 2 weeks (dispense #14). Remove old patch before applying new one. 70 patch 0  . nicotine polacrilex (COMMIT) 2 MG lozenge Take 1 lozenge (2 mg total) by mouth as needed for smoking cessation. 100 tablet 0  . potassium chloride SA (K-DUR,KLOR-CON) 20 MEQ tablet Take 1 tablet by mouth as directed. Take on the days you take Lasix  3  . sertraline (ZOLOFT) 50 MG tablet TAKE 1 TABLET BY MOUTH DAILY 90 tablet 3  . simvastatin (ZOCOR) 40 MG tablet Take 1 tablet (40 mg total) by mouth at bedtime. 90 tablet 3  . SYMBICORT 160-4.5 MCG/ACT inhaler INHALE 2 PUFFS INTO THE LUNGS TWICE DAILY  3   No current facility-administered medications for this visit.    Allergies:   Aspirin and Lasix   Social History:  The patient  reports that he has been smoking Cigarettes.  He started smoking about 2 years ago. He has a 75 pack-year smoking history. He quit smokeless tobacco use about 3 years ago. He reports that he does not drink alcohol.   Family History:  The patient's  family history includes Heart attack in his mother and sister; Heart failure in his mother; Hypertension in his sister. There is no history of Stroke.    ROS:  Please see the history of present illness.   All other systems are reviewed and negative.    PHYSICAL EXAM: VS:  BP 108/80 mmHg  Pulse 77  Ht 5\' 6"  (1.676 m)  Wt 112 lb 9.6 oz (51.075 kg)  BMI 18.18 kg/m2  SpO2 92% , BMI Body mass index is 18.18 kg/(m^2). GEN: thin and chronically ill, in no acute distress HEENT: normal Neck: no JVD, carotid bruits, or masses Cardiac: RRR (Paced) Respiratory:  Decreased BS throughout with poor expiratory flow, normal work of breathing GI: soft, nontender, nondistended, + BS MS: no  deformity or atrophy Skin: warm and dry  Neuro:  Strength and sensation are intact Psych: euthymic mood, full affect   Recent Labs: 07/17/2015: ALT 17; TSH 2.50 07/19/2015: B Natriuretic Peptide 670.3*; BUN 23*; Creatinine, Ser 0.70; Hemoglobin 12.7*; Platelets 329; Potassium 4.9; Sodium 141    Lipid Panel     Component Value Date/Time   CHOL 129 09/07/2014 1421   TRIG 83 09/07/2014 1421   HDL 48 09/07/2014 1421   CHOLHDL 2.7 09/07/2014 1421   VLDL 17 09/07/2014 1421   LDLCALC 64 09/07/2014 1421  Wt Readings from Last 3 Encounters:  10/10/15 112 lb 9.6 oz (51.075 kg)  09/13/15 112 lb 1.6 oz (50.848 kg)  08/22/15 112 lb 12.8 oz (51.166 kg)     ASSESSMENT AND PLAN:  1.  Permanent afib S/p AV nodal ablation Continue long term anticoagulation with eliquis (chads2vasc score is at least 3)  2. CHB S/p BiV pacemaker Normal pacemaker function See Pace Art report No changes today  3. HTN Stable No change required today  4. PVD Follows with Dr Hart [place]  5. Tobacco use Cessation advised Will order chantix.  Risks of the medicine discussed with patient  Merlin Return to see EP PA every 6 months and I will see when needed  Signed, Leonard Range, MD  10/10/2015 11:13 AM     [facility] [address] [place] [address] [phone] (office) [phone] (fax)

## 2015-10-10 NOTE — Patient Instructions (Addendum)
Medication Instructions:  Your physician recommends that you continue on your current medications as directed. Please refer to the Current Medication list given to you today. Chantix called in for smoking---take as prescribed    Labwork: None ordered   Testing/Procedures: None ordered   Follow-Up: Your physician wants you to follow-up in: 6 months with Francis Dowse, PA You will receive a reminder letter in the mail two months in advance. If you don't receive a letter, please call our office to schedule the follow-up appointment.  Remote monitoring is used to monitor your  ICD from home. This monitoring reduces the number of office visits required to check your device to one time per year. It allows [place] to keep an eye on the functioning of your device to ensure it is working properly. You are scheduled for a device check from home on 01/09/16. You may send your transmission at any time that day. If you have a wireless device, the transmission will be sent automatically. After your physician reviews your transmission, you will receive a postcard with your next transmission date.     Any Other Special Instructions Will Be Listed Below (If Applicable).  Smoking Cessation, Tips for Success   I have called in Chantix for you  If you are ready to quit smoking, congratulations! You have chosen to help yourself be healthier. Cigarettes bring nicotine, tar, carbon monoxide, and other irritants into your body. Your lungs, heart, and blood vessels will be able to work better without these poisons. There are many different ways to quit smoking. Nicotine gum, nicotine patches, a nicotine inhaler, or nicotine nasal spray can help with physical craving. Hypnosis, support groups, and medicines help break the habit of smoking. WHAT THINGS CAN I DO TO MAKE QUITTING EASIER?  Here are some tips to help you quit for good:  Pick a date when you will quit smoking completely. Tell all of your friends and family  about your plan to quit on that date.  Do not try to slowly cut down on the number of cigarettes you are smoking. Pick a quit date and quit smoking completely starting on that day.  Throw away all cigarettes.   Clean and remove all ashtrays from your home, work, and car.  On a card, write down your reasons for quitting. Carry the card with you and read it when you get the urge to smoke.  Cleanse your body of nicotine. Drink enough water and fluids to keep your urine clear or pale yellow. Do this after quitting to flush the nicotine from your body.  Learn to predict your moods. Do not let a bad situation be your excuse to have a cigarette. Some situations in your life might tempt you into wanting a cigarette.  Never have "just one" cigarette. It leads to wanting another and another. Remind yourself of your decision to quit.  Change habits associated with smoking. If you smoked while driving or when feeling stressed, try other activities to replace smoking. Stand up when drinking your coffee. Brush your teeth after eating. Sit in a different chair when you read the paper. Avoid alcohol while trying to quit, and try to drink fewer caffeinated beverages. Alcohol and caffeine may urge you to smoke.  Avoid foods and drinks that can trigger a desire to smoke, such as sugary or spicy foods and alcohol.  Ask people who smoke not to smoke around you.  Have something planned to do right after eating or having a cup of coffee.  For example, plan to take a walk or exercise.  Try a relaxation exercise to calm you down and decrease your stress. Remember, you may be tense and nervous for the first 2 weeks after you quit, but this will pass.  Find new activities to keep your hands busy. Play with a pen, coin, or rubber band. Doodle or draw things on paper.  Brush your teeth right after eating. This will help cut down on the craving for the taste of tobacco after meals. You can also try mouthwash.   Use  oral substitutes in place of cigarettes. Try using lemon drops, carrots, cinnamon sticks, or chewing gum. Keep them handy so they are available when you have the urge to smoke.  When you have the urge to smoke, try deep breathing.  Designate your home as a nonsmoking area.  If you are a heavy smoker, ask your health care provider about a prescription for nicotine chewing gum. It can ease your withdrawal from nicotine.  Reward yourself. Set aside the cigarette money you save and buy yourself something nice.  Look for support from others. Join a support group or smoking cessation program. Ask someone at home or at work to help you with your plan to quit smoking.  Always ask yourself, "Do I need this cigarette or is this just a reflex?" Tell yourself, "Today, I choose not to smoke," or "I do not want to smoke." You are reminding yourself of your decision to quit.  Do not replace cigarette smoking with electronic cigarettes (commonly called e-cigarettes). The safety of e-cigarettes is unknown, and some may contain harmful chemicals.  If you relapse, do not give up! Plan ahead and think about what you will do the next time you get the urge to smoke. HOW WILL I FEEL WHEN I QUIT SMOKING? You may have symptoms of withdrawal because your body is used to nicotine (the addictive substance in cigarettes). You may crave cigarettes, be irritable, feel very hungry, cough often, get headaches, or have difficulty concentrating. The withdrawal symptoms are only temporary. They are strongest when you first quit but will go away within 10-14 days. When withdrawal symptoms occur, stay in control. Think about your reasons for quitting. Remind yourself that these are signs that your body is healing and getting used to being without cigarettes. Remember that withdrawal symptoms are easier to treat than the major diseases that smoking can cause.  Even after the withdrawal is over, expect periodic urges to smoke. However,  these cravings are generally short lived and will go away whether you smoke or not. Do not smoke! WHAT RESOURCES ARE AVAILABLE TO HELP ME QUIT SMOKING? Your health care provider can direct you to community resources or hospitals for support, which may include:  Group support.  Education.  Hypnosis.  Therapy.   This information is not intended to replace advice given to you by your health care provider. Make sure you discuss any questions you have with your health care provider.   Document Released: 07/11/2004 Document Revised: 11/03/2014 Document Reviewed: 03/31/2013 Elsevier Interactive Patient Education [employer]2016 [employer] Inc.      If you need a refill on your cardiac medications before your next appointment, please call your pharmacy.

## 2015-11-06 ENCOUNTER — Ambulatory Visit (INDEPENDENT_AMBULATORY_CARE_PROVIDER_SITE_OTHER): Payer: Medicare Other

## 2015-11-06 DIAGNOSIS — Z950 Presence of cardiac pacemaker: Secondary | ICD-10-CM

## 2015-11-06 DIAGNOSIS — I5032 Chronic diastolic (congestive) heart failure: Secondary | ICD-10-CM

## 2015-11-08 ENCOUNTER — Telehealth: Payer: Self-pay

## 2015-11-08 NOTE — Telephone Encounter (Signed)
Spoke with patient.

## 2015-11-08 NOTE — Progress Notes (Signed)
EPIC Encounter for ICM Monitoring  Patient Name: Leonard Massey is a 70 y.o. male Date: 11/08/2015 Primary Care Physican: Gust RungHoffman, Erik C, DO Primary Cardiologist: Allred Electrophysiologist: Allred Dry Weight: 118 lbs   Bi-V Pacing >99%      In the past month, have you:  1. Gained more than 2 pounds in a day or more than 5 pounds in a week? no  2. Had changes in your medications (with verification of current medications)? no  3. Had more shortness of breath than is usual for you? no  4. Limited your activity because of shortness of breath? no  5. Not been able to sleep because of shortness of breath? no  6. Had increased swelling in your feet or ankles? no  7. Had symptoms of dehydration (dizziness, dry mouth, increased thirst, decreased urine output) no  8. Had changes in sodium restriction? no  9. Been compliant with medication? Yes   ICM trend: 3 month view 11/06/2015  ICM trend: 1 year view 11/06/2015   Follow-up plan: ICM clinic phone appointment on 12/10/2015.  Corvue daily impedance below reference line 11/03/2015 11/06/2015 but returning to baseline on 11/06/2015.  Patient denied any HF symptoms.  He stated he is always Leonard Massey of breathe when walking.  He has no complaints today.  No changes today and encouraged to call should he experience any symptoms.  Copy of note sent to patient's primary care physician, primary cardiologist, and device following physician.  Karie SodaLaurie S Evadne Ose, RN, CCM 11/08/2015 3:39 PM

## 2015-11-08 NOTE — Telephone Encounter (Signed)
Remote ICM transmission received.  Attempted patient call and left message for return call.   

## 2015-11-29 ENCOUNTER — Other Ambulatory Visit: Payer: Self-pay | Admitting: Vascular Surgery

## 2015-11-29 LAB — CREATININE, SERUM: CREATININE: 0.67 mg/dL — AB (ref 0.70–1.25)

## 2015-12-10 ENCOUNTER — Ambulatory Visit (INDEPENDENT_AMBULATORY_CARE_PROVIDER_SITE_OTHER): Payer: Medicare Other

## 2015-12-10 DIAGNOSIS — Z950 Presence of cardiac pacemaker: Secondary | ICD-10-CM

## 2015-12-10 DIAGNOSIS — I5032 Chronic diastolic (congestive) heart failure: Secondary | ICD-10-CM

## 2015-12-11 ENCOUNTER — Telehealth: Payer: Self-pay

## 2015-12-11 NOTE — Telephone Encounter (Signed)
Received ICM transmission.  Attempted patient call and no answer or answering machine.

## 2015-12-13 NOTE — Progress Notes (Signed)
EPIC Encounter for ICM Monitoring  Patient Name: Leonard Massey is a 70 y.o. male Date: 12/13/2015 Primary Care Physican: Gust Rung, DO Primary Cardiologist: Allred Electrophysiologist: Allred Dry Weight: 120 lbs       In the past month, have you:  1. Gained more than 2 pounds in a day or more than 5 pounds in a week? no  2. Had changes in your medications (with verification of current medications)? no  3. Had more shortness of breath than is usual for you? no  4. Limited your activity because of shortness of breath? no  5. Not been able to sleep because of shortness of breath? no  6. Had increased swelling in your feet or ankles? no  7. Had symptoms of dehydration (dizziness, dry mouth, increased thirst, decreased urine output) no  8. Had changes in sodium restriction? no  9. Been compliant with medication? Yes   ICM trend: 3 month view for 12/10/2015   ICM trend: 1 year view for 12/10/2015   Follow-up plan: ICM clinic phone appointment on 01/21/2016.  CorVue thoracic impedance below reference line 11/28/2015 to 12/02/2015 suggesting fluid accumulation.  He denied any fluid symptoms.  Thoracic impedance back to reference line 12/03/2015 suggesting stable fluid levels.  He reported breathing is unchanged and always has some SOB.  He stated he has some fatigue but is doing ok.  Encouraged to call if he has any fluid symptoms.  No changes today.    Copy of note sent to patient's primary care physician, primary cardiologist, and device following physician.  [name], RN, CCM 12/13/2015 3:22 PM

## 2015-12-14 ENCOUNTER — Encounter: Payer: Self-pay | Admitting: Vascular Surgery

## 2015-12-14 NOTE — Telephone Encounter (Signed)
Spoke with patient.

## 2015-12-18 ENCOUNTER — Ambulatory Visit
Admission: RE | Admit: 2015-12-18 | Discharge: 2015-12-18 | Disposition: A | Discharge status: Home / Self Care | Payer: Medicare Other | Source: Ambulatory Visit | Attending: Vascular Surgery | Admitting: Vascular Surgery

## 2015-12-18 DIAGNOSIS — I739 Peripheral vascular disease, unspecified: Secondary | ICD-10-CM

## 2015-12-18 DIAGNOSIS — T82858D Stenosis of vascular prosthetic devices, implants and grafts, subsequent encounter: Secondary | ICD-10-CM

## 2015-12-18 DIAGNOSIS — I6522 Occlusion and stenosis of left carotid artery: Secondary | ICD-10-CM

## 2015-12-18 MED ORDER — IOPAMIDOL (ISOVUE-370) INJECTION 76%
100.0000 mL | Freq: Once | INTRAVENOUS | Status: AC | PRN
  Administered 2015-12-18: 100 mL via INTRAVENOUS

## 2015-12-25 ENCOUNTER — Encounter: Payer: Self-pay | Admitting: Vascular Surgery

## 2015-12-25 ENCOUNTER — Ambulatory Visit (HOSPITAL_COMMUNITY)
Admission: RE | Admit: 2015-12-25 | Discharge: 2015-12-25 | Disposition: A | Discharge status: Home / Self Care | Payer: Medicare Other | Source: Ambulatory Visit | Attending: Vascular Surgery | Admitting: Vascular Surgery

## 2015-12-25 ENCOUNTER — Ambulatory Visit (INDEPENDENT_AMBULATORY_CARE_PROVIDER_SITE_OTHER): Payer: Medicare Other | Admitting: Vascular Surgery

## 2015-12-25 VITALS — BP 182/88 | HR 71 | Temp 98.5°F | Resp 16 | Ht 66.0 in | Wt 113.0 lb

## 2015-12-25 DIAGNOSIS — Z720 Tobacco use: Secondary | ICD-10-CM | POA: Diagnosis not present

## 2015-12-25 DIAGNOSIS — I110 Hypertensive heart disease with heart failure: Secondary | ICD-10-CM | POA: Diagnosis not present

## 2015-12-25 DIAGNOSIS — Y838 Other surgical procedures as the cause of abnormal reaction of the patient, or of later complication, without mention of misadventure at the time of the procedure: Secondary | ICD-10-CM | POA: Diagnosis not present

## 2015-12-25 DIAGNOSIS — I5032 Chronic diastolic (congestive) heart failure: Secondary | ICD-10-CM | POA: Insufficient documentation

## 2015-12-25 DIAGNOSIS — R0989 Other specified symptoms and signs involving the circulatory and respiratory systems: Secondary | ICD-10-CM | POA: Diagnosis present

## 2015-12-25 DIAGNOSIS — I739 Peripheral vascular disease, unspecified: Secondary | ICD-10-CM

## 2015-12-25 DIAGNOSIS — T82858D Stenosis of vascular prosthetic devices, implants and grafts, subsequent encounter: Secondary | ICD-10-CM | POA: Insufficient documentation

## 2015-12-25 DIAGNOSIS — I6522 Occlusion and stenosis of left carotid artery: Secondary | ICD-10-CM | POA: Insufficient documentation

## 2015-12-25 DIAGNOSIS — E785 Hyperlipidemia, unspecified: Secondary | ICD-10-CM | POA: Diagnosis not present

## 2015-12-25 NOTE — Progress Notes (Signed)
Subjective:     Patient ID: Leonard Massey, male   DOB: 12-28-1945, 70 y.o.   MRN: [id number]  HPI This 70 year old male returns today for continued follow-up regarding his left carotid subclavian bypass which was done in [place] proximally 30 years ago -unknown who perform the surgery. He has also had a pacemaker inserted at [facility]. He was referred for [place] to follow his multiple areas of vascular occlusive disease. He has no history of previous stroke lateralizing weakness aphasia, or amaurosis fugax. He has had some syncopal episodes in the past. He has a long history of ongoing tobacco abuse 55+ years at 1-2 packs per day. He currently has bilateral calf claudication left worse than right with node rest pain, history of nonhealing ulcers or infection.  At last visit duplex scan was confusing suggesting that patient may have severe occlusive disease at the proximal left common carotid artery at the aortic arch and possible reversed flow in a subclavian carotid bypass. Patient denies any upper extremity claudication symptoms.  Past Medical History  Diagnosis Date  . COPD (chronic obstructive pulmonary disease) (HCC)   . Depression   . GERD (gastroesophageal reflux disease)     Pt reports having EGD/colonoscopy in 2010 by Dr. [name] in [place] wich showed ulcers on uppper and normal colon. No report in emr.   . Hyperlipidemia     Pt typically has low HDL and low LDL.   [name] Essential hypertension   . Low back pain   . Cervical spondylosis   . Alcoholism (HCC)   . Subclavian steal syndrome     HX of.   . Right knee pain 8/10    Began to complain of after requesting a scooter.  Exam was negative and it was decided that pt should continue to ambulate.   . Permanent atrial fibrillation (HCC)   . Complete heart block (HCC)     a. h/o permanent atrial fibrillation s/p St. Jude BiV pacemaker implanted by Dr. Bard Herbert at [facility] in 12/2014 with concomitant AVN ablation.  . Coronary artery  calcification seen on CT scan 02/2015    a. Lexiscan nuclear stress testing 08/01/15 which was low risk, no reversible ischemia, EF 56%..  . Chronic diastolic CHF (congestive heart failure) (HCC)   . Tobacco abuse   . PVD (peripheral vascular disease) (HCC)     a. followed by vascular surgery (prior carotid subclavian bypass for subclavian steal ~03, bilateral aortoiliac and superficial femoral occlusive disease with calf claudication being managed medically.    Social History  Substance Use Topics  . Smoking status: Current Some Day Smoker -- 1.50 packs/day for 50 years    Types: Cigarettes    Start date: 07/15/2013    Last Attempt to Quit: 08/14/2013  . Smokeless tobacco: Former Neurosurgeon    Quit date: 02/01/2012     Comment: Using E-cigs. pt smoking about 3 cigarettes a month 04-12-15  . Alcohol Use: No    Family History  Problem Relation Age of Onset  . Heart failure Mother   . Hypertension Sister   . Heart attack Sister   . Heart attack Mother   . Stroke Neg Hx     Allergies  Allergen Reactions  . Aspirin     REACTION: He denies anything more than mild gastric upset when he takes uncoated ASA Pt tolerates enteric coated aspirin   . Lasix [Furosemide]     ?Rash - although rash did not look like typical drug rash in 06/2015.  Current outpatient prescriptions:  .  albuterol (PROAIR HFA) 108 (90 BASE) MCG/ACT inhaler, Inhale 1-2 puffs into the lungs every 6 (six) hours as needed for wheezing or shortness of breath., Disp: , Rfl:  .  ALPRAZolam (XANAX) 0.25 MG tablet, Take 1 tablet by mouth three times a day, Disp: , Rfl: 3 .  apixaban (ELIQUIS) 5 MG TABS tablet, Take 1 tablet (5 mg total) by mouth 2 (two) times daily., Disp: 60 tablet, Rfl: 11 .  aspirin (ASPIRIN EC) 81 MG EC tablet, Take 81 mg by mouth daily. Swallow whole., Disp: , Rfl:  .  budesonide (PULMICORT) 0.5 MG/2ML nebulizer solution, INHALE 1 VIAL VIA NEBULIZER TWICE DAILY, Disp: , Rfl: 3 .  furosemide (LASIX) 40  MG tablet, Take 1 tablet by mouth daily as needed. Swelling, Disp: , Rfl: 1 .  HYDROcodone-acetaminophen (NORCO) 10-325 MG tablet, Take 1 tablet by mouth every 8 (eight) hours as needed for severe pain., Disp: 90 tablet, Rfl: 0 .  ipratropium-albuterol (DUONEB) 0.5-2.5 (3) MG/3ML SOLN, Inhale the contents of 1 vial by nebulization four times a day, Disp: , Rfl: 3 .  metoprolol succinate (TOPROL XL) 100 MG 24 hr tablet, Take 1 tablet (100 mg total) by mouth daily. Take with or immediately following a meal., Disp: 90 tablet, Rfl: 3 .  nicotine (NICODERM CQ - DOSED IN MG/24 HOURS) 21 mg/24hr patch, Use 21mg  patch one daily for 6 weeks (dispense #42), then 14mg  patch one daily for 2 weeks (dispense #14), then 7mg  patch one daily for 2 weeks (dispense #14). Remove old patch before applying new one., Disp: 70 patch, Rfl: 0 .  nicotine polacrilex (COMMIT) 2 MG lozenge, Take 1 lozenge (2 mg total) by mouth as needed for smoking cessation., Disp: 100 tablet, Rfl: 0 .  potassium chloride SA (K-DUR,KLOR-CON) 20 MEQ tablet, Take 1 tablet by mouth as directed. Take on the days you take Lasix, Disp: , Rfl: 3 .  sertraline (ZOLOFT) 50 MG tablet, TAKE 1 TABLET BY MOUTH DAILY, Disp: 90 tablet, Rfl: 3 .  simvastatin (ZOCOR) 40 MG tablet, Take 1 tablet (40 mg total) by mouth at bedtime., Disp: 90 tablet, Rfl: 3 .  SYMBICORT 160-4.5 MCG/ACT inhaler, INHALE 2 PUFFS INTO THE LUNGS TWICE DAILY, Disp: , Rfl: 3 .  varenicline (CHANTIX PAK) 0.5 MG X 11 & 1 MG X 42 tablet, Take one 0.5 mg tablet by mouth once daily for 3 days, then increase to one 0.5 mg tablet twice daily for 4 days, then increase to one 1 mg tablet twice daily., Disp: 53 tablet, Rfl: 0  Filed Vitals:   12/25/15 1042 12/25/15 1046  BP: 174/94 182/88  Pulse: 70 71  Temp: 98.5 F (36.9 C)   TempSrc: Oral   Resp: 16   Height: 5\' 6"  (1.676 m)   Weight: 113 lb (51.256 kg)   SpO2: 98%     Body mass index is 18.25 kg/(m^2).             Review  of Systems The history of present illness. Patient denies chest pain, does complain of dyspnea on exertion which limits him as much as his lower extremity claudication symptoms. Other systems negative and complete review of systems     Objective:   Physical Exam BP 182/88 mmHg  Pulse 71  Temp(Src) 98.5 F (36.9 C) (Oral)  Resp 16  Ht 5\' 6"  (1.676 m)  Wt 113 lb (51.256 kg)  BMI 18.25 kg/m2  SpO2 98%   patient has  60 mm gradient brachial pressures right higher than left on carotid duplex scan today Gen.-alert and oriented x3 in no apparent distress HEENT normal for age Lungs no rhonchi or wheezing Cardiovascular regular rhythm no murmurs carotid pulses 3+ palpable  Left supra clavicular bruit audible Abdomen soft nontender no palpable masses Musculoskeletal free of  major deformities Skin clear -no rashes Neurologic normal Lower extremities 1-2+ femoral  Pulses palpable bilaterally. Absent popliteal and distal pulses. Both feet free of ischemic changes Right upper extremity with 3+ brachial and radial pulse  Left upper extremity with 2+ brachial and absent radial pulse. Left upper extremity well perfused.  Today I ordered a CT angiogram of the head and neck which I reviewed by computer and also reviewed the radiology report. This revealed the left carotid subclavian bypass to be patent with some circumferential stenosis at the origin of the bypass on the common carotid artery. The proximal common carotid artery has significant calcification at the aortic arch and there is no stenosis at the carotid bifurcation. The proximal left subclavian artery is totally occluded with good flow through the graft supplying the left upper extremity.  There is no evidence of right carotid flow reduction   Also ordered bilateral lower extremity waveforms and ABIs. Right leg has 0.68 at the dorsalis pedis and 0.89 and the left dorsalis pedis       Assessment:      #1 diffuse PAD with lower extremity  occlusive disease in aortoiliac and bilateral superficial femoral-popliteal and tibial levels with 1 block claudication and no limb threatening ischemia #2 patent left carotid to subclavian bypass placed 30+ years ago with concentric stenosis at the origin of the graft on the carotid artery. 60 mm gradient in brachial blood pressures right higher than left but currently asymptomatic  #3 pacemaker   #4 ongoing tobacco abuse greater than 60 pack years     Plan:      return in 9 months to see Dr. Lemar Livings  for continued follow-up Will obtain carotid duplex exam to follow left carotid subclavian bypass Also will obtain ABIs to follow lower extremities

## 2015-12-25 NOTE — Patient Instructions (Signed)
Please review the tobacco cessation information given to you today. It lists many hints that are useful in your effort to stop smoking. The [place] Tobacco Cessation contact phone # is [phone] These nurses and advisors offer lots of FREE information and aids to help you quit.    The [facility] Quit Smoking line #  800-784-8669, they will also assist you with programs designed to help you stop smoking.    Smoking Cessation, Tips for Success If you are ready to quit smoking, congratulations! You have chosen to help yourself be healthier. Cigarettes bring nicotine, tar, carbon monoxide, and other irritants into your body. Your lungs, heart, and blood vessels will be able to work better without these poisons. There are many different ways to quit smoking. Nicotine gum, nicotine patches, a nicotine inhaler, or nicotine nasal spray can help with physical craving. Hypnosis, support groups, and medicines help break the habit of smoking. WHAT THINGS CAN I DO TO MAKE QUITTING EASIER?  Here are some tips to help you quit for good:  Pick a date when you will quit smoking completely. Tell all of your friends and family about your plan to quit on that date.  Do not try to slowly cut down on the number of cigarettes you are smoking. Pick a quit date and quit smoking completely starting on that day.  Throw away all cigarettes.   Clean and remove all ashtrays from your home, work, and car.  On a card, write down your reasons for quitting. Carry the card with you and read it when you get the urge to smoke.  Cleanse your body of nicotine. Drink enough water and fluids to keep your urine clear or pale yellow. Do this after quitting to flush the nicotine from your body.  Learn to predict your moods. Do not let a bad situation be your excuse to have a cigarette. Some situations in your life might tempt you into wanting a cigarette.  Never have "just one" cigarette. It leads to wanting another and another. Remind  yourself of your decision to quit.  Change habits associated with smoking. If you smoked while driving or when feeling stressed, try other activities to replace smoking. Stand up when drinking your coffee. Brush your teeth after eating. Sit in a different chair when you read the paper. Avoid alcohol while trying to quit, and try to drink fewer caffeinated beverages. Alcohol and caffeine may urge you to smoke.  Avoid foods and drinks that can trigger a desire to smoke, such as sugary or spicy foods and alcohol.  Ask people who smoke not to smoke around you.  Have something planned to do right after eating or having a cup of coffee. For example, plan to take a walk or exercise.  Try a relaxation exercise to calm you down and decrease your stress. Remember, you may be tense and nervous for the first 2 weeks after you quit, but this will pass.  Find new activities to keep your hands busy. Play with a pen, coin, or rubber band. Doodle or draw things on paper.  Brush your teeth right after eating. This will help cut down on the craving for the taste of tobacco after meals. You can also try mouthwash.   Use oral substitutes in place of cigarettes. Try using lemon drops, carrots, cinnamon sticks, or chewing gum. Keep them handy so they are available when you have the urge to smoke.  When you have the urge to smoke, try deep breathing.  Designate   your home as a nonsmoking area.  If you are a heavy smoker, ask your health care provider about a prescription for nicotine chewing gum. It can ease your withdrawal from nicotine.  Reward yourself. Set aside the cigarette money you save and buy yourself something nice.  Look for support from others. Join a support group or smoking cessation program. Ask someone at home or at work to help you with your plan to quit smoking.  Always ask yourself, "Do I need this cigarette or is this just a reflex?" Tell yourself, "Today, I choose not to smoke," or "I do  not want to smoke." You are reminding yourself of your decision to quit.  Do not replace cigarette smoking with electronic cigarettes (commonly called e-cigarettes). The safety of e-cigarettes is unknown, and some may contain harmful chemicals.  If you relapse, do not give up! Plan ahead and think about what you will do the next time you get the urge to smoke. HOW WILL I FEEL WHEN I QUIT SMOKING? You may have symptoms of withdrawal because your body is used to nicotine (the addictive substance in cigarettes). You may crave cigarettes, be irritable, feel very hungry, cough often, get headaches, or have difficulty concentrating. The withdrawal symptoms are only temporary. They are strongest when you first quit but will go away within 10-14 days. When withdrawal symptoms occur, stay in control. Think about your reasons for quitting. Remind yourself that these are signs that your body is healing and getting used to being without cigarettes. Remember that withdrawal symptoms are easier to treat than the major diseases that smoking can cause.  Even after the withdrawal is over, expect periodic urges to smoke. However, these cravings are generally short lived and will go away whether you smoke or not. Do not smoke! WHAT RESOURCES ARE AVAILABLE TO HELP ME QUIT SMOKING? Your health care provider can direct you to community resources or hospitals for support, which may include:  Group support.  Education.  Hypnosis.  Therapy.   This information is not intended to replace advice given to you by your health care provider. Make sure you discuss any questions you have with your health care provider.   Document Released: 07/11/2004 Document Revised: 11/03/2014 Document Reviewed: 03/31/2013 Elsevier Interactive Patient Education 2016 Elsevier Inc.   

## 2015-12-25 NOTE — Addendum Note (Signed)
Addended by: Adria Dill L on: 12/25/2015 02:54 PM   Modules accepted: Orders

## 2015-12-25 NOTE — Progress Notes (Signed)
Filed Vitals:   12/25/15 1042 12/25/15 1046  BP: 174/94 182/88  Pulse: 70 71  Temp: 98.5 F (36.9 C)   TempSrc: Oral   Resp: 16   Height:  (1.676 m)   Weight: 113 lb (51.256 kg)   SpO2: 98%

## 2015-12-27 ENCOUNTER — Ambulatory Visit (INDEPENDENT_AMBULATORY_CARE_PROVIDER_SITE_OTHER): Payer: Medicare Other | Admitting: Internal Medicine

## 2015-12-27 ENCOUNTER — Encounter: Payer: Self-pay | Admitting: Internal Medicine

## 2015-12-27 VITALS — BP 122/85 | HR 80 | Temp 98.2°F | Wt 115.3 lb

## 2015-12-27 DIAGNOSIS — J449 Chronic obstructive pulmonary disease, unspecified: Secondary | ICD-10-CM | POA: Diagnosis not present

## 2015-12-27 DIAGNOSIS — I10 Essential (primary) hypertension: Secondary | ICD-10-CM | POA: Diagnosis present

## 2015-12-27 DIAGNOSIS — G894 Chronic pain syndrome: Secondary | ICD-10-CM

## 2015-12-27 DIAGNOSIS — F17210 Nicotine dependence, cigarettes, uncomplicated: Secondary | ICD-10-CM

## 2015-12-27 DIAGNOSIS — I482 Chronic atrial fibrillation: Secondary | ICD-10-CM | POA: Diagnosis not present

## 2015-12-27 DIAGNOSIS — Z79899 Other long term (current) drug therapy: Secondary | ICD-10-CM | POA: Diagnosis not present

## 2015-12-27 DIAGNOSIS — Z79891 Long term (current) use of opiate analgesic: Secondary | ICD-10-CM

## 2015-12-27 DIAGNOSIS — F17200 Nicotine dependence, unspecified, uncomplicated: Secondary | ICD-10-CM

## 2015-12-27 DIAGNOSIS — Z7901 Long term (current) use of anticoagulants: Secondary | ICD-10-CM

## 2015-12-27 DIAGNOSIS — Z7951 Long term (current) use of inhaled steroids: Secondary | ICD-10-CM

## 2015-12-27 DIAGNOSIS — I4821 Permanent atrial fibrillation: Secondary | ICD-10-CM

## 2015-12-27 MED ORDER — HYDROCODONE-ACETAMINOPHEN 10-325 MG PO TABS: 1.0000 | ORAL_TABLET | Freq: Three times a day (TID) | ORAL | Status: DC | PRN

## 2015-12-27 MED ORDER — BUPROPION HCL ER (SMOKING DET) 150 MG PO TB12: ORAL_TABLET | ORAL | Status: DC

## 2015-12-27 NOTE — Progress Notes (Signed)
[name] Subjective:   Patient ID: Leonard Massey male   DOB: Apr 22, 1946 70 y.o.   MRN: [id number]  HPI: [name] is a 70 y.o. male with a PMH detailed below who presents for 3 month follow up of his multiple chronic conditions.  Please see problem based charting below for the status of his chronic medical problems.    Past Medical History  Diagnosis Date  . COPD (chronic obstructive pulmonary disease) (HCC)   . Depression   . GERD (gastroesophageal reflux disease)     Pt reports having EGD/colonoscopy in 2010 by Dr. [name] in [place] wich showed ulcers on uppper and normal colon. No report in emr.   . Hyperlipidemia     Pt typically has low HDL and low LDL.   [name] Essential hypertension   . Low back pain   . Cervical spondylosis   . Alcoholism (HCC)   . Subclavian steal syndrome     HX of.   . Right knee pain 8/10    Began to complain of after requesting a scooter.  Exam was negative and it was decided that pt should continue to ambulate.   . Permanent atrial fibrillation (HCC)   . Complete heart block (HCC)     a. h/o permanent atrial fibrillation s/p St. Jude BiV pacemaker implanted by Dr. Bard Herbert at [facility] in 12/2014 with concomitant AVN ablation.  . Coronary artery calcification seen on CT scan 02/2015    a. Lexiscan nuclear stress testing 08/01/15 which was low risk, no reversible ischemia, EF 56%..  . Chronic diastolic CHF (congestive heart failure) (HCC)   . Tobacco abuse   . PVD (peripheral vascular disease) (HCC)     a. followed by vascular surgery (prior carotid subclavian bypass for subclavian steal ~03, bilateral aortoiliac and superficial femoral occlusive disease with calf claudication being managed medically.   Current Outpatient Prescriptions  Medication Sig Dispense Refill  . albuterol (PROAIR HFA) 108 (90 BASE) MCG/ACT inhaler Inhale 1-2 puffs into the lungs every 6 (six) hours as needed for wheezing or shortness of breath.    .  ALPRAZolam (XANAX) 0.25 MG tablet Take 1 tablet by mouth three times a day  3  . apixaban (ELIQUIS) 5 MG TABS tablet Take 1 tablet (5 mg total) by mouth 2 (two) times daily. 60 tablet 11  . aspirin (ASPIRIN EC) 81 MG EC tablet Take 81 mg by mouth daily. Swallow whole.    . budesonide (PULMICORT) 0.5 MG/2ML nebulizer solution INHALE 1 VIAL VIA NEBULIZER TWICE DAILY  3  . furosemide (LASIX) 40 MG tablet Take 1 tablet by mouth daily as needed. Swelling  1  . HYDROcodone-acetaminophen (NORCO) 10-325 MG tablet Take 1 tablet by mouth every 8 (eight) hours as needed for severe pain. 90 tablet 0  . ipratropium-albuterol (DUONEB) 0.5-2.5 (3) MG/3ML SOLN Inhale the contents of 1 vial by nebulization four times a day  3  . metoprolol succinate (TOPROL XL) 100 MG 24 hr tablet Take 1 tablet (100 mg total) by mouth daily. Take with or immediately following a meal. 90 tablet 3  . nicotine (NICODERM CQ - DOSED IN MG/24 HOURS) 21 mg/24hr patch Use  patch one daily for 6 weeks (dispense #42), then  patch one daily for 2 weeks (dispense #14), then  patch one daily for 2 weeks (dispense #14). Remove old patch before applying new one. 70 patch 0  . nicotine polacrilex (COMMIT) 2 MG lozenge Take 1 lozenge (2 mg  total) by mouth as needed for smoking cessation. 100 tablet 0  . potassium chloride SA (K-DUR,KLOR-CON) 20 MEQ tablet Take 1 tablet by mouth as directed. Take on the days you take Lasix  3  . sertraline (ZOLOFT) 50 MG tablet TAKE 1 TABLET BY MOUTH DAILY 90 tablet 3  . simvastatin (ZOCOR) 40 MG tablet Take 1 tablet (40 mg total) by mouth at bedtime. 90 tablet 3  . SYMBICORT 160-4.5 MCG/ACT inhaler INHALE 2 PUFFS INTO THE LUNGS TWICE DAILY  3  . varenicline (CHANTIX PAK) 0.5 MG X 11 & 1 MG X 42 tablet Take one 0.5 mg tablet by mouth once daily for 3 days, then increase to one 0.5 mg tablet twice daily for 4 days, then increase to one 1 mg tablet twice daily. 53 tablet 0   No current facility-administered  medications for this visit.   Family History  Problem Relation Age of Onset  . Heart failure Mother   . Hypertension Sister   . Heart attack Sister   . Heart attack Mother   . Stroke Neg Hx    Social History   Social History  . Marital Status: Divorced    Spouse Name: [name]  . Number of Children: [name]  . Years of Education: [name]   Social History Main Topics  . Smoking status: Current Some Day Smoker -- 0.50 packs/day for 50 years    Types: Cigarettes    Start date: 07/15/2013    Last Attempt to Quit: 08/14/2013  . Smokeless tobacco: Former Neurosurgeon    Quit date: 02/01/2012     Comment: Using E-cigs. pt smoking about 3 cigarettes a month 04-12-15  . Alcohol Use: No  . Drug Use: None  . Sexual Activity: Not Asked   Other Topics Concern  . None   Social History Narrative   Disabled several years, On SSDI and [place].   Lives with disabled girlfriend in a trailer [place].  Works around [employer] and keeps busy.    Review of Systems: Review of Systems  Constitutional: Negative for fever.  Respiratory: Positive for cough. Negative for sputum production.   Cardiovascular: Negative for chest pain.  Musculoskeletal: Positive for back pain and joint pain. Negative for falls.  Neurological: Negative for headaches.     Objective:  Physical Exam: Filed Vitals:   12/27/15 1401  BP: 122/85  Pulse: 80  Temp: 98.2 F (36.8 C)  TempSrc: Oral  Weight: 115 lb 4.8 oz (52.3 kg)  SpO2: 99%  Physical Exam  Constitutional: He is well-developed, well-nourished, and in no distress.  HENT:  aniscoria (chronic)  Pulmonary/Chest: Effort normal and breath sounds normal. He has no wheezes.  Abdominal: Soft. Bowel sounds are normal.  Nursing note and vitals reviewed.   Assessment & Plan:  Case discussed with Dr. Heide Spark  Essential hypertension HPI: BP was abnormally elevated last week at Vascular surgery visit.  He is taking his medications.  A: Essential HTN, at goal  P: Continue  Metoprolol succinate 100mg  daily  Permanent atrial fibrillation HPI: No bleeding, denies palpitations  A: Permanent A fib, rate controlled  P: - Continue Eliquis - Rate control with Metoprolol  COPD, severe HPI: SOB at times but feeling well today. Still working on quitting smoking.  A: Severe COPD  P:  -Continue Symbicort, Duonebs PRN.  Tobacco use disorder HPI: Smoking a little less than 1/2 PPD.  Wants to quit.  NRT was not effective. Tried Chantix >> no  Effective.  A: Tobacco use  disorder  P:  12 week trial of Wellbutrin, asked to set a quit date in 1 week. Spent a dedicated 3 minutes of visit for smoking cessation.  Chronic pain syndrome HPI: He is taking Norco 10/325 for his chronic neck, back, and knee pain.  He is doing well with this medication and it allows to to move about and complete his ADLs without being in severe pain.  He always takes at least 1 pill somedays as much as 3-4 pills.  He did slightly go over the 3 month mark since our last visit and notes that he ran out of the medication 1 week ago.  A: Chronic pain syndrome, Long term use of Opoid analgesic  P: - No red flag behavior, but no UDS on file. While he has not taken the medication in 1 week I will check a UDS today and plan to repeat this in the near future. - Refilled 3 month of Rx, lives in [place] so I have given him all 3 today. - I discussed Rx of Narcan given his co-prescription of Xanax, he is on a low dose of Xanax but more importantly he lives alone and would not have anyone to administer it if needed to I have held off prescribing Narcan.    Medications Ordered Meds ordered this encounter  Medications  . buPROPion (ZYBAN) 150 MG 12 hr tablet    Sig: One pill daily for 3 days then twice daily for 12 weeks for smoking cessation    Dispense:  60 tablet    Refill:  2  . DISCONTD: HYDROcodone-acetaminophen (NORCO) 10-325 MG tablet    Sig: Take 1 tablet by mouth every 8 (eight) hours as  needed for severe pain.    Dispense:  90 tablet    Refill:  0    Rx 1/3  . DISCONTD: HYDROcodone-acetaminophen (NORCO) 10-325 MG tablet    Sig: Take 1 tablet by mouth every 8 (eight) hours as needed for severe pain.    Dispense:  90 tablet    Refill:  0    Rx 2/3To be filled 30 days after print date  . HYDROcodone-acetaminophen (NORCO) 10-325 MG tablet    Sig: Take 1 tablet by mouth every 8 (eight) hours as needed for severe pain.    Dispense:  90 tablet    Refill:  0    Rx 3/3 To be filled 60 days after print date   Other Orders Orders Placed This Encounter  Procedures  . ToxAssure Select,+Antidepr,UR  . Lipid Profile  . BMP8+Anion Gap  . CBC with Diff   Follow Up: Return in about 3 months (around 03/28/2016).

## 2015-12-27 NOTE — Patient Instructions (Signed)
General Instructions:   Please bring your medicines with you each time you come to clinic.  Medicines may include prescription medications, over-the-counter medications, herbal remedies, eye drops, vitamins, or other pills.   Progress Toward Treatment Goals:  Treatment Goal 09/13/2015  Blood pressure at goal  Stop smoking smoking less    Self Care Goals & Plans:  Self Care Goal 09/13/2015  Manage my medications take my medicines as prescribed; bring my medications to every visit; refill my medications on time; follow the sick day instructions if I am sick  Monitor my health keep track of my blood pressure  Eat healthy foods eat foods that are low in salt; eat more vegetables; drink diet soda or water instead of juice or soda  Be physically active take a walk every day; find a convenient safe place to exercise  Stop smoking go to the [employer] ([email or web address]); call [facility] (1-800-QUIT-NOW)    No flowsheet data found.   Care Management & Community Referrals:  Referral 09/13/2015  Referrals made for care management support -  Referrals made to community resources nutrition      Bupropion sustained-release tablets (smoking cessation) What is this medicine? BUPROPION (byoo PROE pee on) is used to help people quit smoking. This medicine may be used for other purposes; ask your health care provider or pharmacist if you have questions. What should I tell my health care provider before I take this medicine? They need to know if you have any of these conditions: -an eating disorder, such as anorexia or bulimia -bipolar disorder or psychosis -diabetes or high blood sugar, treated with medication -glaucoma -head injury or brain tumor -heart disease, previous heart attack, or irregular heart beat -high blood pressure -kidney or liver disease -seizures -suicidal thoughts or a previous suicide attempt -Tourette's syndrome -weight loss -an unusual or allergic  reaction to bupropion, other medicines, foods, dyes, or preservatives -breast-feeding -pregnant or trying to become pregnant How should I use this medicine? Take this medicine by mouth with a glass of water. Follow the directions on the prescription label. You can take it with or without food. If it upsets your stomach, take it with food. Do not cut, crush or chew this medicine. Take your medicine at regular intervals. If you take this medicine more than once a day, take your second dose at least 8 hours after you take your first dose. To limit difficulty in sleeping, avoid taking this medicine at bedtime. Do not take your medicine more often than directed. Do not stop taking this medicine suddenly except upon the advice of your doctor. Stopping this medicine too quickly may cause serious side effects. A special MedGuide will be given to you by the pharmacist with each prescription and refill. Be sure to read this information carefully each time. Talk to your pediatrician regarding the use of this medicine in children. Special care may be needed. Overdosage: If you think you have taken too much of this medicine contact a poison control center or emergency room at once. NOTE: This medicine is only for you. Do not share this medicine with others. What if I miss a dose? If you miss a dose, skip the missed dose and take your next tablet at the regular time. There should be at least 8 hours between doses. Do not take double or extra doses. What may interact with this medicine? Do not take this medicine with any of the following medications: -linezolid -MAOIs like Azilect, Carbex, Eldepryl, Marplan, Nardil, and Parnate -methylene  blue (injected into a vein) -other medicines that contain bupropion like Wellbutrin This medicine may also interact with the following medications: -alcohol -certain medicines for anxiety or sleep -certain medicines for blood pressure like metoprolol, propranolol -certain  medicines for depression or psychotic disturbances -certain medicines for HIV or AIDS like efavirenz, lopinavir, nelfinavir, ritonavir -certain medicines for irregular heart beat like propafenone, flecainide -certain medicines for Parkinson's disease like amantadine, levodopa -certain medicines for seizures like carbamazepine, phenytoin, phenobarbital -cimetidine -clopidogrel -cyclophosphamide -furazolidone -isoniazid -nicotine -orphenadrine -procarbazine -steroid medicines like prednisone or cortisone -stimulant medicines for attention disorders, weight loss, or to stay awake -tamoxifen -theophylline -thiotepa -ticlopidine -tramadol -warfarin This list may not describe all possible interactions. Give your health care provider a list of all the medicines, herbs, non-prescription drugs, or dietary supplements you use. Also tell them if you smoke, drink alcohol, or use illegal drugs. Some items may interact with your medicine. What should I watch for while using this medicine? Visit your doctor or health care professional for regular checks on your progress. This medicine should be used together with a patient support program. It is important to participate in a behavioral program, counseling, or other support program that is recommended by your health care professional. Patients and their families should watch out for new or worsening thoughts of suicide or depression. Also watch out for sudden changes in feelings such as feeling anxious, agitated, panicky, irritable, hostile, aggressive, impulsive, severely restless, overly excited and hyperactive, or not being able to sleep. If this happens, especially at the beginning of treatment or after a change in dose, call your health care professional. Avoid alcoholic drinks while taking this medicine. Drinking excessive alcoholic beverages, using sleeping or anxiety medicines, or quickly stopping the use of these agents while taking this medicine  may increase your risk for a seizure. Do not drive or use heavy machinery until you know how this medicine affects you. This medicine can impair your ability to perform these tasks. Do not take this medicine close to bedtime. It may prevent you from sleeping. Your mouth may get dry. Chewing sugarless gum or sucking hard candy, and drinking plenty of water may help. Contact your doctor if the problem does not go away or is severe. Do not use nicotine patches or chewing gum without the advice of your doctor or health care professional while taking this medicine. You may need to have your blood pressure taken regularly if your doctor recommends that you use both nicotine and this medicine together. What side effects may I notice from receiving this medicine? Side effects that you should report to your doctor or health care professional as soon as possible: -allergic reactions like skin rash, itching or hives, swelling of the face, lips, or tongue -breathing problems -changes in vision -confusion -fast or irregular heartbeat -hallucinations -increased blood pressure -redness, blistering, peeling or loosening of the skin, including inside the mouth -seizures -suicidal thoughts or other mood changes -unusually weak or tired -vomiting Side effects that usually do not require medical attention (report to your doctor or health care professional if they continue or are bothersome): -change in sex drive or performance -constipation -headache -loss of appetite -nausea -tremors -weight loss This list may not describe all possible side effects. Call your doctor for medical advice about side effects. You may report side effects to FDA at 1-800-FDA-1088. Where should I keep my medicine? Keep out of the reach of children. Store at room temperature between 20 and 25 degrees C (68 and  77 degrees F). Protect from light. Keep container tightly closed. Throw away any unused medicine after the expiration  date. NOTE: This sheet is a summary. It may not cover all possible information. If you have questions about this medicine, talk to your doctor, pharmacist, or health care provider.    2016, Elsevier/[employer] Standard. (2013-06-10 10:55:10)

## 2015-12-27 NOTE — Assessment & Plan Note (Signed)
HPI: He is taking Norco 10/325 for his chronic neck, back, and knee pain.  He is doing well with this medication and it allows to to move about and complete his ADLs without being in severe pain.  He always takes at least 1 pill somedays as much as 3-4 pills.  He did slightly go over the 3 month mark since our last visit and notes that he ran out of the medication 1 week ago.  A: Chronic pain syndrome, Long term use of Opoid analgesic  P: - No red flag behavior, but no UDS on file. While he has not taken the medication in 1 week I will check a UDS today and plan to repeat this in the near future. - Refilled 3 month of Rx, lives in [place] so I have given him all 3 today. - I discussed Rx of Narcan given his co-prescription of Xanax, he is on a low dose of Xanax but more importantly he lives alone and would not have anyone to administer it if needed to I have held off prescribing Narcan.

## 2015-12-27 NOTE — Assessment & Plan Note (Signed)
HPI: No bleeding, denies palpitations  A: Permanent A fib, rate controlled  P: - Continue Eliquis - Rate control with Metoprolol

## 2015-12-27 NOTE — Assessment & Plan Note (Signed)
HPI: Smoking a little less than 1/2 PPD.  Wants to quit.  NRT was not effective. Tried Chantix >> no  Effective.  A: Tobacco use disorder  P:  12 week trial of Wellbutrin, asked to set a quit date in 1 week. Spent a dedicated 3 minutes of visit for smoking cessation.

## 2015-12-27 NOTE — Assessment & Plan Note (Signed)
HPI: BP was abnormally elevated last week at Vascular surgery visit.  He is taking his medications.  A: Essential HTN, at goal  P: Continue Metoprolol succinate  daily

## 2015-12-27 NOTE — Assessment & Plan Note (Signed)
HPI: SOB at times but feeling well today. Still working on quitting smoking.  A: Severe COPD  P:  -Continue Symbicort, Duonebs PRN.

## 2015-12-28 ENCOUNTER — Telehealth: Payer: Self-pay | Admitting: Internal Medicine

## 2015-12-28 LAB — BMP8+ANION GAP
ANION GAP: 20 mmol/L — AB (ref 10.0–18.0)
BUN/Creatinine Ratio: 40 — ABNORMAL HIGH (ref 10–22)
BUN: 23 mg/dL (ref 8–27)
CALCIUM: 9.4 mg/dL (ref 8.6–10.2)
CO2: 22 mmol/L (ref 18–29)
Chloride: 106 mmol/L (ref 96–106)
Creatinine, Ser: 0.57 mg/dL — ABNORMAL LOW (ref 0.76–1.27)
GFR calc non Af Amer: 106 mL/min/{1.73_m2} (ref >59)
GFR, EST AFRICAN AMERICAN: 123 mL/min/{1.73_m2} (ref >59)
GLUCOSE: 84 mg/dL (ref 65–99)
POTASSIUM: 5 mmol/L (ref 3.5–5.2)
Sodium: 148 mmol/L — ABNORMAL HIGH (ref 134–144)

## 2015-12-28 LAB — CBC WITH DIFFERENTIAL/PLATELET
BASOS: 0 %
Basophils Absolute: 0 10*3/uL (ref 0.0–0.2)
EOS (ABSOLUTE): 0.2 10*3/uL (ref 0.0–0.4)
EOS: 2 %
HEMATOCRIT: 41.1 % (ref 37.5–51.0)
HEMOGLOBIN: 13.1 g/dL (ref 12.6–17.7)
IMMATURE GRANS (ABS): 0 10*3/uL (ref 0.0–0.1)
IMMATURE GRANULOCYTES: 0 %
LYMPHS: 18 %
Lymphocytes Absolute: 1.3 10*3/uL (ref 0.7–3.1)
MCH: 28.2 pg (ref 26.6–33.0)
MCHC: 31.9 g/dL (ref 31.5–35.7)
MCV: 89 fL (ref 79–97)
Monocytes Absolute: 0.5 10*3/uL (ref 0.1–0.9)
Monocytes: 8 %
NEUTROS PCT: 72 %
Neutrophils Absolute: 4.9 10*3/uL (ref 1.4–7.0)
PLATELETS: 217 10*3/uL (ref 150–379)
RBC: 4.64 x10E6/uL (ref 4.14–5.80)
RDW: 19.5 % — ABNORMAL HIGH (ref 12.3–15.4)
WBC: 6.9 10*3/uL (ref 3.4–10.8)

## 2015-12-28 LAB — LIPID PANEL
Chol/HDL Ratio: 2.5 ratio units (ref 0.0–5.0)
Cholesterol, Total: 128 mg/dL (ref 100–199)
HDL: 51 mg/dL (ref >39)
LDL Calculated: 52 mg/dL (ref 0–99)
TRIGLYCERIDES: 127 mg/dL (ref 0–149)
VLDL Cholesterol Cal: 25 mg/dL (ref 5–40)

## 2015-12-28 NOTE — Telephone Encounter (Signed)
Pt requesting medicine to stop smoking to be filled @ [facility].

## 2015-12-30 NOTE — Telephone Encounter (Signed)
It appears to me that I did send it to the [facility] in [place]

## 2015-12-31 ENCOUNTER — Other Ambulatory Visit: Payer: Self-pay | Admitting: Acute Care

## 2015-12-31 DIAGNOSIS — F17210 Nicotine dependence, cigarettes, uncomplicated: Principal | ICD-10-CM

## 2015-12-31 NOTE — Progress Notes (Signed)
Internal Medicine Clinic Attending  Case discussed with Dr. Hoffman soon after the resident saw the patient.  We reviewed the resident's history and exam and pertinent patient test results.  I agree with the assessment, diagnosis, and plan of care documented in the resident's note. 

## 2016-01-04 LAB — TOXASSURE SELECT,+ANTIDEPR,UR: PDF: 0

## 2016-01-15 ENCOUNTER — Inpatient Hospital Stay: Admission: RE | Admit: 2016-01-15 | Payer: Medicare Other | Source: Ambulatory Visit

## 2016-01-21 ENCOUNTER — Ambulatory Visit (INDEPENDENT_AMBULATORY_CARE_PROVIDER_SITE_OTHER): Payer: Medicare Other | Admitting: *Deleted

## 2016-01-21 DIAGNOSIS — Z950 Presence of cardiac pacemaker: Secondary | ICD-10-CM | POA: Diagnosis not present

## 2016-01-21 DIAGNOSIS — I442 Atrioventricular block, complete: Secondary | ICD-10-CM | POA: Diagnosis not present

## 2016-01-21 NOTE — Progress Notes (Signed)
Remote pacemaker transmission.   

## 2016-01-21 NOTE — Progress Notes (Signed)
EPIC Encounter for ICM Monitoring  Patient Name: Casimer LeekJohn W Cisse is a 70 y.o. male Date: 01/21/2016 Primary Care Physican: Gust RungHoffman, Erik C, DO Primary Cardiologist: Allred Electrophysiologist: Allred Dry Weight: 120 lb   Bi-V Pacing 99%      In the past month, have you:  1. Gained more than 2 pounds in a day or more than 5 pounds in a week? no  2. Had changes in your medications (with verification of current medications)? no  3. Had more shortness of breath than is usual for you? no  4. Limited your activity because of shortness of breath? no  5. Not been able to sleep because of shortness of breath? no  6. Had increased swelling in your feet or ankles? no  7. Had symptoms of dehydration (dizziness, dry mouth, increased thirst, decreased urine output) no  8. Had changes in sodium restriction? no  9. Been compliant with medication? Yes   ICM trend: 3 month view for 01/21/2016   ICM trend: 1 year view for 01/21/2016   Follow-up plan: ICM clinic phone appointment on 02/21/2016.  Thoracic impedance trending along reference line.  Patient denied any fluid symptoms.  He stated he is feeling fine and denied any problems at this time.    Encouraged to call for any fluid symptoms.  No changes today.      Karie SodaLaurie S Short, RN, CCM 01/21/2016 11:25 AM

## 2016-02-01 LAB — CUP PACEART REMOTE DEVICE CHECK
Battery Voltage: 3.01 V
Date Time Interrogation Session: 20170327060013
Implantable Lead Location: 753860
Lead Channel Pacing Threshold Amplitude: 1 V
Lead Channel Pacing Threshold Pulse Width: 0.4 ms
Lead Channel Setting Pacing Pulse Width: 0.4 ms
Lead Channel Setting Sensing Sensitivity: 4 mV
MDC IDC LEAD IMPLANT DT: 20160310
MDC IDC LEAD IMPLANT DT: 20160310
MDC IDC LEAD LOCATION: 753858
MDC IDC MSMT BATTERY REMAINING LONGEVITY: 115 mo
MDC IDC MSMT BATTERY REMAINING PERCENTAGE: 95.5 %
MDC IDC MSMT LEADCHNL LV IMPEDANCE VALUE: 950 Ohm
MDC IDC MSMT LEADCHNL RV IMPEDANCE VALUE: 540 Ohm
MDC IDC MSMT LEADCHNL RV PACING THRESHOLD AMPLITUDE: 0.625 V
MDC IDC MSMT LEADCHNL RV PACING THRESHOLD PULSEWIDTH: 0.4 ms
MDC IDC MSMT LEADCHNL RV SENSING INTR AMPL: 12 mV
MDC IDC PG SERIAL: 7714572
MDC IDC SET LEADCHNL LV PACING AMPLITUDE: 2 V
MDC IDC SET LEADCHNL LV PACING PULSEWIDTH: 0.4 ms
MDC IDC SET LEADCHNL RV PACING AMPLITUDE: 2 V
Pulse Gen Model: 3242

## 2016-02-05 ENCOUNTER — Encounter: Payer: Self-pay | Admitting: Cardiology

## 2016-02-11 ENCOUNTER — Encounter: Payer: Self-pay | Admitting: Internal Medicine

## 2016-02-21 ENCOUNTER — Ambulatory Visit (INDEPENDENT_AMBULATORY_CARE_PROVIDER_SITE_OTHER): Payer: Medicare Other

## 2016-02-21 DIAGNOSIS — I5032 Chronic diastolic (congestive) heart failure: Secondary | ICD-10-CM | POA: Diagnosis not present

## 2016-02-21 DIAGNOSIS — Z950 Presence of cardiac pacemaker: Secondary | ICD-10-CM

## 2016-02-21 NOTE — Progress Notes (Signed)
EPIC Encounter for ICM Monitoring  Patient Name: Leonard Massey is a 70 y.o. male Date: 02/21/2016 Primary Care Physican: Gust RungHoffman, Erik C, DO Primary Cardiologist: Allred Electrophysiologist: Allred Dry Weight: 120 lbs   Bi-V Pacing 99%      In the past month, have you:  1. Gained more than 2 pounds in a day or more than 5 pounds in a week? no  2. Had changes in your medications (with verification of current medications)? no  3. Had more shortness of breath than is usual for you? no  4. Limited your activity because of shortness of breath? no  5. Not been able to sleep because of shortness of breath? no  6. Had increased swelling in your feet or ankles? no  7. Had symptoms of dehydration (dizziness, dry mouth, increased thirst, decreased urine output) no  8. Had changes in sodium restriction? no  9. Been compliant with medication? Yes  ICM trend: 3 month view for 02/21/2016  ICM trend: 1 year view for 02/21/2016   Follow-up plan: ICM clinic phone appointment 03/26/2016 and office visit with Francis Dowseenee Ursuy, NP on 04/14/2016.    FLUID LEVELS: Corvue thoracic impedance decreased 02/07/2016 to 02/09/2016 and 02/13/2016 to 02/16/2016 suggesting fluid accumulation.  Returned to baseline 4/23/2017suggesting fluid level stabilized. SYMPTOMS: None. Encouraged to call for any fluid symptoms.  No changes today.     Karie SodaLaurie S Short, RN, CCM 02/21/2016 2:36 PM

## 2016-03-07 ENCOUNTER — Other Ambulatory Visit: Payer: Self-pay

## 2016-03-08 MED ORDER — METOPROLOL SUCCINATE ER 100 MG PO TB24: 100.0000 mg | ORAL_TABLET | Freq: Every day | ORAL | Status: AC

## 2016-03-18 ENCOUNTER — Inpatient Hospital Stay: Admission: RE | Admit: 2016-03-18 | Payer: Medicare Other | Source: Ambulatory Visit

## 2016-03-26 ENCOUNTER — Ambulatory Visit (INDEPENDENT_AMBULATORY_CARE_PROVIDER_SITE_OTHER): Payer: Medicare Other

## 2016-03-26 DIAGNOSIS — Z950 Presence of cardiac pacemaker: Secondary | ICD-10-CM | POA: Diagnosis not present

## 2016-03-26 DIAGNOSIS — I5032 Chronic diastolic (congestive) heart failure: Secondary | ICD-10-CM

## 2016-03-26 NOTE — Progress Notes (Signed)
EPIC Encounter for ICM Monitoring  Patient Name: Leonard Massey is a 70 y.o. male Date: 03/26/2016 Primary Care Physican: Gust RungHoffman, Erik C, DO Primary Cardiologist: Allred Electrophysiologist: Allred Dry Weight: 108 lbs   Bi-V Pacing >99%      In the past month, have you:  1. Gained more than 2 pounds in a day or more than 5 pounds in a week? no  2. Had changes in your medications (with verification of current medications)? no  3. Had more shortness of breath than is usual for you? no  4. Limited your activity because of shortness of breath? no  5. Not been able to sleep because of shortness of breath? no  6. Had increased swelling in your feet, ankles, legs or stomach area? no  7. Had symptoms of dehydration (dizziness, dry mouth, increased thirst, decreased urine output) no  8. Had changes in sodium restriction? no  9. Been compliant with medication? Yes  ICM trend: 3 month view for 03/26/2016   ICM trend: 1 year view for 03/26/2016   Follow-up plan: ICM clinic phone appointment 05/15/2016.  Office appointment with Francis Dowseenee Ursuy, NP on 04/14/2016.  FLUID LEVELS:  Corvue thoracic impedance decreased 03/11/2016 to 03/18/2016 suggesting fluid accumulation and return to baseline 03/19/2016 suggesting stable fluid levels.    SYMPTOMS:  Denied any symptoms such as weight gain of 3 pounds overnight or 5 pounds within a week, SOB and/or lower extremity swelling.   Patient has been losing weight over the last 2 months and has tried drinking supplements for nutrition.  He has appointment with Dr Mikey BussingHoffman, PCP tomorrow.  Encouraged him to discuss weight loss with PCP.  Encouraged to call for any fluid symptoms.   EDUCATION: Limit sodium intake to < 2000 mg and fluid intake to 64 oz daily.     RECOMMENDATIONS: No changes today.     Karie SodaLaurie S Shamere Dilworth, RN, CCM 03/26/2016 10:29 AM

## 2016-03-27 ENCOUNTER — Encounter: Payer: Self-pay | Admitting: Internal Medicine

## 2016-03-27 ENCOUNTER — Ambulatory Visit (INDEPENDENT_AMBULATORY_CARE_PROVIDER_SITE_OTHER): Payer: Medicare Other | Admitting: Internal Medicine

## 2016-03-27 VITALS — BP 116/72 | HR 71 | Temp 97.9°F | Ht 66.0 in | Wt 108.8 lb

## 2016-03-27 DIAGNOSIS — Z79891 Long term (current) use of opiate analgesic: Secondary | ICD-10-CM | POA: Diagnosis not present

## 2016-03-27 DIAGNOSIS — F17210 Nicotine dependence, cigarettes, uncomplicated: Secondary | ICD-10-CM | POA: Diagnosis not present

## 2016-03-27 DIAGNOSIS — I10 Essential (primary) hypertension: Secondary | ICD-10-CM

## 2016-03-27 DIAGNOSIS — M542 Cervicalgia: Secondary | ICD-10-CM | POA: Diagnosis not present

## 2016-03-27 DIAGNOSIS — I482 Chronic atrial fibrillation: Secondary | ICD-10-CM | POA: Diagnosis not present

## 2016-03-27 DIAGNOSIS — R634 Abnormal weight loss: Secondary | ICD-10-CM | POA: Diagnosis not present

## 2016-03-27 DIAGNOSIS — M549 Dorsalgia, unspecified: Secondary | ICD-10-CM

## 2016-03-27 DIAGNOSIS — R9389 Abnormal findings on diagnostic imaging of other specified body structures: Secondary | ICD-10-CM

## 2016-03-27 DIAGNOSIS — M25569 Pain in unspecified knee: Secondary | ICD-10-CM

## 2016-03-27 DIAGNOSIS — G894 Chronic pain syndrome: Secondary | ICD-10-CM | POA: Diagnosis not present

## 2016-03-27 DIAGNOSIS — Z681 Body mass index (BMI) 19 or less, adult: Secondary | ICD-10-CM

## 2016-03-27 DIAGNOSIS — R918 Other nonspecific abnormal finding of lung field: Secondary | ICD-10-CM | POA: Diagnosis not present

## 2016-03-27 DIAGNOSIS — I4821 Permanent atrial fibrillation: Secondary | ICD-10-CM

## 2016-03-27 MED ORDER — HYDROCODONE-ACETAMINOPHEN 10-325 MG PO TABS: 1.0000 | ORAL_TABLET | Freq: Three times a day (TID) | ORAL | Status: DC | PRN

## 2016-03-27 NOTE — Progress Notes (Signed)
Internal Medicine Clinic Attending  Case discussed with Dr. Hoffman at the time of the visit.  We reviewed the resident's history and exam and pertinent patient test results.  I agree with the assessment, diagnosis, and plan of care documented in the resident's note.  

## 2016-03-27 NOTE — Progress Notes (Signed)
[facility] Subjective:   Patient ID: Leonard Massey male   DOB: June 29, 1946 70 y.o.   MRN: [id number]  HPI: Leonard Massey is a 70 y.o. male with a PMH detailed below who presents for 3 month follow up of chronic pain, HTN, A-fib  Please see problem based charting below for the status of his chronic medical issues.    Past Medical History  Diagnosis Date  . COPD (chronic obstructive pulmonary disease) (HCC)   . Depression   . GERD (gastroesophageal reflux disease)     Pt reports having EGD/colonoscopy in 2010 by Dr. Chales [place]rah[place]Gupta in [place] w[place] showed ulcers on uppper and normal colon. No report in emr.   . Hyperlipidemia     Pt typically has low HDL and low LDL.   [name]. Essential hypertension   . Low back pain   . Cervical spondylosis   . Alcoholism (HCC)   . Subclavian steal syndrome     HX of.   . Right knee pain 8/10    Began to complain of after requesting a scooter.  Exam was negative and it was decided that pt should continue to ambulate.   . Permanent atrial fibrillation (HCC)   . Complete heart block (HCC)     a. h/o permanent atrial fibrillation s/p [employer] BiV pacemaker implanted by Dr. Bard [facility]Simmonds [facility] 12/2014 with concomitant AVN ablation.  . Coronary artery calcification seen on CT scan 02/2015    a. Lexiscan nuclear stress testing 08/01/15 which was low risk, no reversible ischemia, EF 56%..  . Chronic diastolic CHF (congestive heart failure) (HCC)   . Tobacco abuse   . PVD (peripheral vascular disease) (HCC)     a. followed by vascular surgery (prior carotid subclavian bypass for subclavian steal ~03, bilateral aortoiliac and superficial femoral occlusive disease with calf claudication being managed medically.   Current Outpatient Prescriptions  Medication Sig Dispense Refill  . albuterol (PROAIR HFA) 108 (90 BASE) MCG/ACT inhaler Inhale 1-2 puffs into the lungs every 6 (six) hours as needed for wheezing or shortness of breath.    . ALPRAZolam  (XANAX) 0.25 MG tablet Take 1 tablet by mouth three times a day  3  . apixaban (ELIQUIS) 5 MG TABS tablet Take 1 tablet (5 mg total) by mouth 2 (two) times daily. 60 tablet 11  . aspirin (ASPIRIN EC) 81 MG EC tablet Take 81 mg by mouth daily. Swallow whole.    . budesonide (PULMICORT) 0.5 MG/2ML nebulizer solution INHALE 1 VIAL VIA NEBULIZER TWICE DAILY  3  . buPROPion (ZYBAN) 150 MG 12 hr tablet One pill daily for 3 days then twice daily for 12 weeks for smoking cessation 60 tablet 2  . furosemide (LASIX) 40 MG tablet Take 1 tablet by mouth daily as needed. Swelling  1  . HYDROcodone-acetaminophen (NORCO) 10-325 MG tablet Take 1 tablet by mouth every 8 (eight) hours as needed for severe pain. 90 tablet 0  . ipratropium-albuterol (DUONEB) 0.5-2.5 (3) MG/3ML SOLN Inhale the contents of 1 vial by nebulization four times a day  3  . metoprolol succinate (TOPROL XL) 100 MG 24 hr tablet Take 1 tablet (100 mg total) by mouth daily. Take with or immediately following a meal. 90 tablet 3  . potassium chloride SA (K-DUR,KLOR-CON) 20 MEQ tablet Take 1 tablet by mouth as directed. Take on the days you take Lasix  3  . sertraline (ZOLOFT) 50 MG tablet TAKE 1 TABLET BY MOUTH DAILY 90  tablet 3  . simvastatin (ZOCOR) 40 MG tablet Take 1 tablet (40 mg total) by mouth at bedtime. 90 tablet 3  . SYMBICORT 160-4.5 MCG/ACT inhaler INHALE 2 PUFFS INTO THE LUNGS TWICE DAILY  3   No current facility-administered medications for this visit.   Family History  Problem Relation Age of Onset  . Heart failure Mother   . Hypertension Sister   . Heart attack Sister   . Heart attack Mother   . Stroke Neg Hx    Social History   Social History  . Marital Status: Divorced    Spouse Name: [name]  . Number of Children: [name]  . Years of Education: [name]   Social History Main Topics  . Smoking status: Current Some Day Smoker -- 0.50 packs/day for 50 years    Types: Cigarettes    Start date: 07/15/2013    Last Attempt to  Quit: 08/14/2013  . Smokeless tobacco: Former Neurosurgeon    Quit date: 02/01/2012     Comment: Using E-cigs. pt smoking about 3 cigarettes a month 04-12-15  . Alcohol Use: No  . Drug Use: None  . Sexual Activity: Not Asked   Other Topics Concern  . None   Social History Narrative   Disabled several years, On SSD[place] and [place].   Lives with disabled girlfriend in a trailer [place].  Works around [employer] and keeps busy.     Wt Readings from Last 5 Encounters:  03/27/16 108 lb 12.8 oz (49.351 kg)  12/27/15 115 lb 4.8 oz (52.3 kg)  12/25/15 113 lb (51.256 kg)  10/10/15 112 lb 9.6 oz (51.075 kg)  09/13/15 112 lb 1.6 oz (50.848 kg)    Review of Systems: Review of Systems  Constitutional: Positive for weight loss. Negative for fever and chills.  Respiratory: Negative for shortness of breath.   Cardiovascular: Negative for chest pain.  Gastrointestinal: Negative for abdominal pain, diarrhea and blood in stool.  Genitourinary: Negative for dysuria.  Musculoskeletal: Negative for myalgias.  Neurological: Negative for dizziness and headaches.  Psychiatric/Behavioral: Negative for depression.     Objective:  Physical Exam: Filed Vitals:   03/27/16 1324 03/27/16 1356  BP: 64/48 116/72  Pulse: 73 71  Temp: 97.9 F (36.6 C)   TempSrc: Oral   Height: 5\' 6"  (1.676 m)   Weight: 108 lb 12.8 oz (49.351 kg)   SpO2: 95%    Physical Exam  Constitutional: He is oriented to person, place, and time.  thin  Cardiovascular: Normal heart sounds.   Pulmonary/Chest: Effort normal and breath sounds normal.  Abdominal: Soft. Bowel sounds are normal.  Musculoskeletal: He exhibits no edema.  Neurological: He is alert and oriented to person, place, and time.  Nursing note and vitals reviewed.   Assessment & Plan:  Case discussed with Dr. Heide Spark  Essential hypertension BP Readings from Last 3 Encounters:  03/27/16 116/72  12/27/15 122/85  12/25/15 182/88    Lab Results  Component Value  Date   NA 148* 12/27/2015   K 5.0 12/27/2015   CREATININE 0.57* 12/27/2015    Assessment: Blood pressure control: controlled Progress toward BP goal:  at goal Comments:   Plan: Medications: Continue Metprorolol daily, lasix PRN Educational resources provided:   Self management tools provided:   Other plans: none   HPI: Taking Metoprolol Succinate 100mg  daily, lasix PRN.  No issues.  Permanent atrial fibrillation HPI: No bleeding issues, feeling a little weak but otherwise well  A: Permanent A-fib  P: -Continue metoprolol  succinate  daily for rate control - Continue eliquis  BID for A/C  Chronic pain syndrome HPI: Patient takes Norco 10/325 for chronic neck, back and knee pain.  He is prescribed #90 pills a month, and takes on average 3 pills a day.  His refill history has been appropriate and had no red flags.  A: Chronic pain syndrome  P: Will refill his medication at the same dose.  Was given 3 month refill today.  I have repeated a random UDS today.  Abnormal screening CT of chest HPI: He had an abnormal Screening CT last year that was followed up in May of 2016, he is due for a repeat CT this year which has been ordered.  He reports that he has already done this with his pulmonologist in [place].   A: Abnormal screening CT of chest  P: Obtain records from Pulmonologist.  Body mass index (BMI) of 19 or less in adult HPI: He has had some additional weight loss, he does report that his girlfriend had a stroke and is in a SNF and his sister has had health issues, it does not appear he is eating well.  His previous workup for cause of his weight loss was negative and his weight had previously stabilized.  A: BMI of 17  P: Will monitor closely and follow up his CT results.  I am considering changing his Sertraline to Mirtazapine which may help with some weight gain.    Medications Ordered Meds ordered this encounter  Medications  . DISCONTD:  HYDROcodone-acetaminophen (NORCO) 10-325 MG tablet    Sig: Take 1 tablet by mouth every 8 (eight) hours as needed for severe pain.    Dispense:  90 tablet    Refill:  0    Rx 1/3 To be filled 30 days after last Rx  . DISCONTD: HYDROcodone-acetaminophen (NORCO) 10-325 MG tablet    Sig: Take 1 tablet by mouth every 8 (eight) hours as needed for severe pain.    Dispense:  90 tablet    Refill:  0    Rx 2/3 To be filled 30 days after last Rx  . HYDROcodone-acetaminophen (NORCO) 10-325 MG tablet    Sig: Take 1 tablet by mouth every 8 (eight) hours as needed for severe pain.    Dispense:  90 tablet    Refill:  0    Rx 3/3 To be filled 30 days after last Rx   Other Orders Orders Placed This Encounter  Procedures  . ToxAssure Select,+Antidepr,UR   Follow Up: Return in about 3 months (around 06/27/2016).

## 2016-03-27 NOTE — Patient Instructions (Signed)
General Instructions:  Continue to work on quiting smoking.  I want you to keep track of your blood pressure at home. If you feel more depressed please give me a call instead of waiting till the next appointment time.  Thank you for bringing your medicines today. This helps us keep you safe from mistakes.   Progress Toward Treatment Goals:  Treatment Goal 09/13/2015  Blood pressure at goal  Stop smoking smoking less    Self Care Goals & Plans:  Self Care Goal 09/13/2015  Manage my medications take my medicines as prescribed; bring my medications to every visit; refill my medications on time; follow the sick day instructions if I am sick  Monitor my health keep track of my blood pressure  Eat healthy foods eat foods that are low in salt; eat more vegetables; drink diet soda or water instead of juice or soda  Be physically active take a walk every day; find a convenient safe place to exercise  Stop smoking go to the [employer] website ([employer].com.eewww.quitlinenc.com); call [employer] (1-800-QUIT-NOW)    No flowsheet data found.   Care Management & Community Referrals:  Referral 09/13/2015  Referrals made for care management support -  Referrals made to community resources nutrition

## 2016-03-27 NOTE — Assessment & Plan Note (Signed)
HPI: He has had some additional weight loss, he does report that his girlfriend had a stroke and is in a SNF and his sister has had health issues, it does not appear he is eating well.  His previous workup for cause of his weight loss was negative and his weight had previously stabilized.  A: BMI of 17  P: Will monitor closely and follow up his CT results.  I am considering changing his Sertraline to Mirtazapine which may help with some weight gain.

## 2016-03-27 NOTE — Assessment & Plan Note (Signed)
HPI: No bleeding issues, feeling a little weak but otherwise well  A: Permanent A-fib  P: -Continue metoprolol succinate 100mg  daily for rate control - Continue eliquis 5mg  BID for A/C

## 2016-03-27 NOTE — Assessment & Plan Note (Signed)
HPI: Patient takes Norco 10/325 for chronic neck, back and knee pain.  He is prescribed #90 pills a month, and takes on average 3 pills a day.  His refill history has been appropriate and had no red flags.  A: Chronic pain syndrome  P: Will refill his medication at the same dose.  Was given 3 month refill today.  I have repeated a random UDS today.

## 2016-03-27 NOTE — Assessment & Plan Note (Signed)
HPI: He had an abnormal Screening CT last year that was followed up in May of 2016, he is due for a repeat CT this year which has been ordered.  He reports that he has already done this with his pulmonologist in [place].   A: Abnormal screening CT of chest  P: Obtain records from Pulmonologist.

## 2016-03-27 NOTE — Assessment & Plan Note (Addendum)
BP Readings from Last 3 Encounters:  03/27/16 116/72  12/27/15 122/85  12/25/15 182/88    Lab Results  Component Value Date   NA 148* 12/27/2015   K 5.0 12/27/2015   CREATININE 0.57* 12/27/2015    Assessment: Blood pressure control: controlled Progress toward BP goal:  at goal Comments:   Plan: Medications: Continue Metprorolol daily, lasix PRN Educational resources provided:   Self management tools provided:   Other plans: none   HPI: Taking Metoprolol Succinate 100mg  daily, lasix PRN.  No issues.

## 2016-03-30 ENCOUNTER — Other Ambulatory Visit: Payer: Self-pay | Admitting: Internal Medicine

## 2016-04-02 ENCOUNTER — Telehealth: Payer: Self-pay

## 2016-04-02 NOTE — Telephone Encounter (Signed)
Leonard LeekJohn W Massey is a 70 y.o. male who was contacted via telephone for monitoring of apixaban (Eliquis) therapy.    ASSESSMENT Indication(s): atrial fibrillation Duration: indefinite  Labs:    Component Value Date/Time   AST 29 07/17/2015 1543   ALT 17 07/17/2015 1543   NA 148* 12/27/2015 1442   NA 141 07/19/2015 1416   K 5.0 12/27/2015 1442   CL 106 12/27/2015 1442   CO2 22 12/27/2015 1442   GLUCOSE 84 12/27/2015 1442   GLUCOSE 155* 07/19/2015 1416   BUN 23 12/27/2015 1442   BUN 23* 07/19/2015 1416   CREATININE 0.57* 12/27/2015 1442   CREATININE 0.67* 11/29/2015 1116   CALCIUM 9.4 12/27/2015 1442   GFRNONAA 106 12/27/2015 1442   GFRNONAA >89 09/07/2014 1421   GFRAA 123 12/27/2015 1442   GFRAA >89 09/07/2014 1421   WBC 6.9 12/27/2015 1442   WBC 12.1* 07/19/2015 1416   HGB 12.7* 07/19/2015 1416   HCT 41.1 12/27/2015 1442   HCT 41.9 07/19/2015 1416   PLT 217 12/27/2015 1442   PLT 329 07/19/2015 1416    apixaban (Eliquis) Dose: 5 mg BID  Safety: Patient reports no recent signs or symptoms of bleeding, no signs of symptoms of thromboembolism. Medication changes: no.  Adherence: Patient has adherence challenges. Patient does not correctly recite the dose. When asked how he took this medication he stated he took his Eliquis once daily (qAM). Contacted pharmacy and records indicate refills are not consistent. He has been consistently filling his prescription every two months (he is getting 60 tablets/script). Since he was taking it once daily his consistency of fills matches. Corrected patient that his script is written to take two tablets a day. He repeated understanding to take one in the morning and one in the evening.   Patient Instructions: Patient advised to contact clinic or seek medical attention if signs/symptoms of bleeding or thromboembolism occur. Patient verbalized understanding by repeating back information.  Follow-up Cardiology appointment 04/14/16.     Leonard LopeHailey L  Topaz Massey Student Pharmacist  04/02/2016, 1:45 PM

## 2016-04-02 NOTE — Telephone Encounter (Signed)
Patient was contacted  by Hailey Hill, PharmD candidate. I agree with the assessment and plan of care documented. 

## 2016-04-03 LAB — TOXASSURE SELECT,+ANTIDEPR,UR: PDF: 0

## 2016-04-13 NOTE — Progress Notes (Signed)
Patient ID: Leonard Massey, male   DOB: [birth year], 70 y.o.   MRN: [id number]    Cardiology Office Note Date:  04/14/2016  Patient ID:  Leonard Massey, DOB [birth year], MRN [id number] PCP:  Gust RungHoffman, Erik C, DO  Electrophysiologist:  Dr. Johney FrameAllred    Chief Complaint: routine EP visit, device check  History of Present Illness: Leonard Massey is a 70 y.o. male with history of Afib with AVnodal ablation and BiVe pacer implant, DDD, DJD, PVD with hx of subclavian steal sx with carotid bypass and knwn LE disease (follows with vascular), COPD, ongoing heavy tobacco and hx of ETOH, HTN, HLD.  He is being seen today for Dr. Johney FrameAllred who he last saw in dec 2016 doing well at that time, Rx Chantix, though he states did not seem to help at all.  He is being treated for URI and states has not smoked in 1 1/2 weeks with this and believes he can stay off them.  He denies any kind of CP, palpitations or SOB, no symptoms of PND or orthopnea, no dizziness, near syncope or syncope.   he has had some nighttime aching to b/l LE, difficult to know when onset, he states possibly months? A year or so, though does not describe claudication, and chronic R hand numbness for years, he continues with his vascular specialist and states this year had testing on his legs and was told was stable.  His usual MD at that office retired and he cannot recll the name of the new one.  He denies any bleeding or signs of bleeding.  He mentions when laying on his L side he feels his pacer is uncomfortable.   Past Medical History  Diagnosis Date  . COPD (chronic obstructive pulmonary disease) (HCC)   . Depression   . GERD (gastroesophageal reflux disease)     Pt reports having EGD/colonoscopy in 2010 by Dr. Chales AbrahamsGupta in [place] wich showed ulcers on uppper and normal colon. No report in emr.   . Hyperlipidemia     Pt typically has low HDL and low LDL.   [name]. Essential hypertension   . Low back pain   . Cervical spondylosis   . Alcoholism (HCC)   .  Subclavian steal syndrome     HX of.   . Right knee pain 8/10    Began to complain of after requesting a scooter.  Exam was negative and it was decided that pt should continue to ambulate.   . Permanent atrial fibrillation (HCC)   . Complete heart block (HCC)     a. h/o permanent atrial fibrillation s/p [employer] BiV pacemaker implanted by Dr. Bard HerbertSimmonds at [facility] in 12/2014 with concomitant AVN ablation.  . Coronary artery calcification seen on CT scan 02/2015    a. Lexiscan nuclear stress testing 08/01/15 which was low risk, no reversible ischemia, EF 56%..  . Chronic diastolic CHF (congestive heart failure) (HCC)   . Tobacco abuse   . PVD (peripheral vascular disease) (HCC)     a. followed by vascular surgery (prior carotid subclavian bypass for subclavian steal ~03, bilateral aortoiliac and superficial femoral occlusive disease with calf claudication being managed medically.    Past Surgical History  Procedure Laterality Date  . Carotid endartectomy Left   . Left common carotid to subclavian artery bypass  04/1999  . Anterior cervical diskectomy and fusion at c4-5 and c5-6  2002    Dr. Wynetta Emeryram  . Pacemaker insertion  01/04/2015    SJM PM  3242 CRT-P implanted by Dr Sharol Harness at [facility] for AV block s/p AV nodal ablation  . Av nodal ablation  3/16    At [facility]    Current Outpatient Prescriptions  Medication Sig Dispense Refill  . albuterol (PROAIR HFA) 108 (90 BASE) MCG/ACT inhaler Inhale 1-2 puffs into the lungs every 6 (six) hours as needed for wheezing or shortness of breath.    . ALPRAZolam (XANAX) 0.25 MG tablet Take 1 tablet by mouth three times a day  3  . aspirin (ASPIRIN EC) 81 MG EC tablet Take 81 mg by mouth daily. Swallow whole.    [name] azithromycin (ZITHROMAX) 500 MG tablet Take 500 mg by mouth daily.  3  . budesonide (PULMICORT) 0.5 MG/2ML nebulizer solution INHALE 1 VIAL VIA NEBULIZER TWICE DAILY  3  . buPROPion (ZYBAN) 150 MG 12 hr tablet One pill daily for 3 days  then twice daily for 12 weeks for smoking cessation 60 tablet 2  . ELIQUIS 5 MG TABS tablet TAKE 1 TABLET(5 MG) BY MOUTH TWICE DAILY 60 tablet 0  . furosemide (LASIX) 40 MG tablet Take 1 tablet by mouth daily as needed. Swelling  1  . HYDROcodone-acetaminophen (NORCO) 10-325 MG tablet Take 1 tablet by mouth every 8 (eight) hours as needed for severe pain. 90 tablet 0  . ipratropium-albuterol (DUONEB) 0.5-2.5 (3) MG/3ML SOLN Inhale the contents of 1 vial by nebulization four times a day  3  . metoprolol succinate (TOPROL XL) 100 MG 24 hr tablet Take 1 tablet (100 mg total) by mouth daily. Take with or immediately following a meal. 90 tablet 3  . potassium chloride SA (K-DUR,KLOR-CON) 20 MEQ tablet Take 1 tablet by mouth as directed. Take on the days you take Lasix  3  . sertraline (ZOLOFT) 50 MG tablet TAKE 1 TABLET BY MOUTH DAILY 90 tablet 3  . simvastatin (ZOCOR) 40 MG tablet TAKE 1 TABLET(40 MG) BY MOUTH AT BEDTIME 90 tablet 0  . simvastatin (ZOCOR) 40 MG tablet Take 40 mg by mouth daily.    . SYMBICORT 160-4.5 MCG/ACT inhaler INHALE 2 PUFFS INTO THE LUNGS TWICE DAILY  3   No current facility-administered medications for this visit.    Allergies:   Aspirin and Lasix   Social History:  The patient  reports that he has been smoking Cigarettes.  He started smoking about 2 years ago. He has a 25 pack-year smoking history. He quit smokeless tobacco use about 4 years ago. He reports that he does not drink alcohol.   Family History:  The patient's family history includes Heart attack in his mother and sister; Heart failure in his mother; Hypertension in his sister. There is no history of Stroke.   ROS:  Please see the history of present illness.  All other systems are reviewed and otherwise negative.   PHYSICAL EXAM:  VS:  BP 118/68 mmHg  Pulse 70  Ht 5\' 6"  (1.676 m)  Wt 104 lb (47.174 kg)  BMI 16.79 kg/m2 BMI: Body mass index is 16.79 kg/(m^2). Well nourished, very thin, almost cachectic in  appearance, in no acute distress HEENT: normocephalic, atraumatic Neck: no JVD, carotid bruits or masses Cardiac:  normal S1, S2; RRR; no significant murmurs, no rubs, or gallops Lungs:  Mildly diminished throughout, no activewheezing, no rhonchi or rales, normal work of breathing Abd: soft, nontender MS: no deformity, has a nearly cachexic appearance, very kittle musculature Ext: no edema, faint pedal pulses b/l, no cyanosis, + cap refill b/l  Skin: warm and dry, no rash Neuro:  No gross deficits appreciated Psych: euthymic mood, full affect  PPM site is stable, no tethering or discomfort, non-tender, no skin errrosion or erythema is noted.   EKG:  Done today and reviewed by myself, is Afib, V paced PPM interrogation today, reviewed by myself: battery staus is OK, lead/device function is intact  08/01/15: lexiscan stress test:  Study Highlights     Nuclear stress EF: 56%.  No T wave inversion was noted during stress.  There was no ST segment deviation noted during stress.  Defect 1: There is a medium defect of moderate severity.  This is a low risk study.  Fixed septal defect likely related to pacing artifact. No reversible ischemia. LVEF 56% with mildly incoordinate septal motion. This is a low risk study.   07/09/15: Echocardiogram Study Conclusions - Left ventricle: The cavity size was normal. Wall thickness was  normal. Systolic function was normal. The estimated ejection  fraction was in the range of 55% to 60%. Wall motion was normal;  there were no regional wall motion abnormalities. - Mitral valve: There was mild regurgitation. - Left atrium: The atrium was severely dilated. - Right atrium: The atrium was moderately dilated. - Pulmonary arteries: Systolic pressure was mildly to moderately  increased. PA peak pressure: 39 mm Hg (S).  Recent Labs: 07/17/2015: ALT 17; TSH 2.50 07/19/2015: B Natriuretic Peptide 670.3*; Hemoglobin 12.7* 12/27/2015: BUN 23;  Creatinine, Ser 0.57*; Platelets 217; Potassium 5.0; Sodium 148*  12/27/2015: Chol/HDL Ratio 2.5; Cholesterol, Total 128; HDL 51; LDL Calculated 52; Triglycerides 127   CrCl cannot be calculated (Patient has no serum creatinine result on file.).   Wt Readings from Last 3 Encounters:  04/14/16 104 lb (47.174 kg)  03/27/16 108 lb 12.8 oz (49.351 kg)  12/27/15 115 lb 4.8 oz (52.3 kg)     Other studies reviewed: Additional studies/records reviewed today include: summarized above  DEVICE information: STJ CRT-P, implanted 01/04/15, Dr. Melonie Florida   ASSESSMENT AND PLAN:  1. Permanent Afib     S/p AVNodal ablation, PACER DEPENDENT     CHA2DS2Vasc is at least 3 on Eliquis     No bleeding or signs of bleeding     12/27/15: Creat 0.57, H/H stable  2. STJ BiVe pacer     Intact function     Merlin remotes  3. HTN     Appears well controlled  4. HLD     Appears well controlled     He describes an unusual aching to his LE at night, better in the day time usually, not typical of statin myalgia     5. COPD     Recently quit smoking, 1 1/2 weeks!  Strongly encouraged to stay off, he sounds motivated  6. PVD     follows with vascular  7. Very thin, has lost weight     He admits since his partner had a stroke and in a SNF he doesn't cook much or well, and particularly with his reecnt URI, not much of an appetite       Discussed the importance of nutrition, and need for adequate intake , discussed strategies with this.      He reports he is feeing better of late now and states he will be able to do better with his intake   Disposition: F/u with his vascular MD, low suspicion the simva is causing his LE aching at night, though will have him do a trial off it  for 2 weeks.  He is instructed to let us know if he has clear improvement once off and would consider alternative, otherwise to resume it and continuehis f/u withhis vascular specialist.  Will schedule a 34mo merlin remote and 6 month in  clinic visit.  Current medicines are reviewed at length with the patient today.  The patient did not have any concerns regarding medicines.  Judith Blonder, PA-C 04/14/2016 11:25 AM     [facility] [address] [place] [address] [phone] (office)  [phone] (fax)

## 2016-04-14 ENCOUNTER — Encounter: Payer: Self-pay | Admitting: Physician Assistant

## 2016-04-14 ENCOUNTER — Ambulatory Visit (INDEPENDENT_AMBULATORY_CARE_PROVIDER_SITE_OTHER): Payer: Medicare Other | Admitting: Physician Assistant

## 2016-04-14 VITALS — BP 118/68 | HR 70 | Ht 66.0 in | Wt 104.0 lb

## 2016-04-14 DIAGNOSIS — I6522 Occlusion and stenosis of left carotid artery: Secondary | ICD-10-CM

## 2016-04-14 DIAGNOSIS — I5032 Chronic diastolic (congestive) heart failure: Secondary | ICD-10-CM

## 2016-04-14 DIAGNOSIS — E785 Hyperlipidemia, unspecified: Secondary | ICD-10-CM

## 2016-04-14 DIAGNOSIS — I4821 Permanent atrial fibrillation: Secondary | ICD-10-CM

## 2016-04-14 DIAGNOSIS — I482 Chronic atrial fibrillation: Secondary | ICD-10-CM | POA: Diagnosis not present

## 2016-04-14 DIAGNOSIS — I10 Essential (primary) hypertension: Secondary | ICD-10-CM

## 2016-04-14 DIAGNOSIS — Z950 Presence of cardiac pacemaker: Secondary | ICD-10-CM | POA: Diagnosis not present

## 2016-04-14 LAB — CUP PACEART INCLINIC DEVICE CHECK
Battery Remaining Longevity: 109.2
Brady Statistic RA Percent Paced: 0 %
Brady Statistic RV Percent Paced: 99.88 %
Date Time Interrogation Session: 20170619112759
Implantable Lead Implant Date: 20160310
Implantable Lead Implant Date: 20160310
Implantable Lead Location: 753858
Lead Channel Pacing Threshold Amplitude: 0.75 V
Lead Channel Pacing Threshold Amplitude: 1 V
Lead Channel Pacing Threshold Pulse Width: 0.4 ms
Lead Channel Pacing Threshold Pulse Width: 0.4 ms
Lead Channel Setting Pacing Amplitude: 2 V
Lead Channel Setting Pacing Amplitude: 2 V
Lead Channel Setting Pacing Pulse Width: 0.4 ms
MDC IDC LEAD LOCATION: 753860
MDC IDC MSMT BATTERY VOLTAGE: 3.01 V
MDC IDC MSMT LEADCHNL LV IMPEDANCE VALUE: 912.5 Ohm
MDC IDC MSMT LEADCHNL LV PACING THRESHOLD AMPLITUDE: 1 V
MDC IDC MSMT LEADCHNL RV IMPEDANCE VALUE: 537.5 Ohm
MDC IDC MSMT LEADCHNL RV PACING THRESHOLD AMPLITUDE: 0.75 V
MDC IDC MSMT LEADCHNL RV PACING THRESHOLD PULSEWIDTH: 0.4 ms
MDC IDC MSMT LEADCHNL RV PACING THRESHOLD PULSEWIDTH: 0.4 ms
MDC IDC PG MODEL: 3242
MDC IDC SET LEADCHNL LV PACING PULSEWIDTH: 0.4 ms
MDC IDC SET LEADCHNL RV SENSING SENSITIVITY: 4 mV
Pulse Gen Serial Number: 7714572

## 2016-04-14 NOTE — Patient Instructions (Addendum)
Medication Instructions:   STOP TAKING SIMVASTATIN FOR 2 WEEKS DUE TO LEGS ACHING  1.IF ACHING LEGS STOP THEN STAY OFF AND CONTACT OFFICE TO    UPDATE ON SYMPTOMS   2. IF NO CHANGES AT ALL CONTACT OFFICE  ALSO TO UPDATE ON    SYMPTOMS AND FOLLOW UP WITH VASCULAR   If you need a refill on your cardiac medications before your next appointment, please call your pharmacy.  Labwork:  NONE ORDER TODAY    Testing/Procedures: NONE ORDER TODAY    Follow-Up Your physician wants you to follow-up in:  IN  6  MONTHS WITH DR Johney FrameALLRED  You will receive a reminder letter in the mail two months in advance. If you don't receive a letter, please call our office to schedule the follow-up appointment.   Remote monitoring is used to monitor your Pacemaker of ICD from home. This monitoring reduces the number of office visits required to check your device to one time per year. It allows us to keep an eye on the functioning of your device to ensure it is working properly. You are scheduled for a device check from home on .07/14/2016..You may send your transmission at any time that day. If you have a wireless device, the transmission will be sent automatically. After your physician reviews your transmission, you will receive a postcard with your next transmission date.      Any Other Special Instructions Will Be Listed Below (If Applicable).

## 2016-05-10 ENCOUNTER — Other Ambulatory Visit: Payer: Self-pay | Admitting: Internal Medicine

## 2016-05-12 ENCOUNTER — Other Ambulatory Visit: Payer: Self-pay | Admitting: Internal Medicine

## 2016-05-14 NOTE — Telephone Encounter (Signed)
Electronic request refused.Leonard AlvineGoldston, Leonard Manfredo Cassady7/19/201711:20 AM

## 2016-05-14 NOTE — Telephone Encounter (Signed)
This was approved 2 days ago with 5 refills

## 2016-05-15 ENCOUNTER — Ambulatory Visit (INDEPENDENT_AMBULATORY_CARE_PROVIDER_SITE_OTHER): Payer: Medicare Other

## 2016-05-15 DIAGNOSIS — Z950 Presence of cardiac pacemaker: Secondary | ICD-10-CM | POA: Diagnosis not present

## 2016-05-15 DIAGNOSIS — I5032 Chronic diastolic (congestive) heart failure: Secondary | ICD-10-CM | POA: Diagnosis not present

## 2016-05-15 NOTE — Progress Notes (Signed)
EPIC Encounter for ICM Monitoring  Patient Name: Leonard LeekJohn W Massey is a 70 y.o. male Date: 05/15/2016 Primary Care Physican: Gust RungErik C Hoffman, DO Primary Cardiologist: Allred Electrophysiologist: Allred Dry Weight: 104 lb  Bi-V Pacing:  >99%       Heart Failure questions reviewed, pt symptomatic and has ankle swelling which resolved after taking 1 Furosemide 40 mg tablet on 05/13/2016.   Thoracic impedance abnormal suggesting fluid accumulation from 05/08/2016 to 05/15/2016 and did show improvement after taking Furosemide on 05/13/2016.  Recommendations: Patient has Furosemide prescribed prn.  He took 1 tablet Furosemide 40 mg on 05/13/2016 and advised for him to take 1 tablet tomorrow.  In the past, patient unable to tolerate taking Furosemide several days in a row due to extreme weight loss.   Advised to check food labels and follow sodium diet.  Repeat transmission on 05/19/2016  ICM trend: 05/15/2016    Follow-up plan: ICM clinic phone appointment on 05/19/2016.  Copy of ICM check sent to device physician.   Karie SodaLaurie S Quindarius Cabello, RN 05/15/2016 10:40 AM

## 2016-05-19 ENCOUNTER — Ambulatory Visit (INDEPENDENT_AMBULATORY_CARE_PROVIDER_SITE_OTHER): Payer: Medicare Other

## 2016-05-19 DIAGNOSIS — Z950 Presence of cardiac pacemaker: Secondary | ICD-10-CM

## 2016-05-19 DIAGNOSIS — I5032 Chronic diastolic (congestive) heart failure: Secondary | ICD-10-CM | POA: Diagnosis not present

## 2016-05-19 NOTE — Progress Notes (Addendum)
EPIC Encounter for ICM Monitoring  Patient Name: Leonard Massey is a 70 y.o. male Date: 05/19/2016 Primary Care Physican: Gust Rung, DO Primary Cardiologist: Allred Electrophysiologist: Allred Dry Weight: unknown Bi-V Pacing:  >99%       Heart Failure questions reviewed, pt asymptomatic since taking Fursemide  Thoracic impedance returned to normal.    Recommendations: No changes.  Patient gave permission to leave detailed message on phone.   ICM trend: 05/19/2016    Follow-up plan: ICM clinic phone appointment on 06/19/2016.  Copy of ICM check sent to device physician.   [name], RN 05/19/2016 2:28 PM

## 2016-06-19 ENCOUNTER — Ambulatory Visit (INDEPENDENT_AMBULATORY_CARE_PROVIDER_SITE_OTHER): Payer: Medicare Other

## 2016-06-19 DIAGNOSIS — I5032 Chronic diastolic (congestive) heart failure: Secondary | ICD-10-CM | POA: Diagnosis not present

## 2016-06-19 DIAGNOSIS — Z950 Presence of cardiac pacemaker: Secondary | ICD-10-CM | POA: Diagnosis not present

## 2016-06-19 NOTE — Progress Notes (Signed)
EPIC Encounter for ICM Monitoring  Patient Name: Leonard LeekJohn W Massey is a 70 y.o. male Date: 06/19/2016 Primary Care Physican: Gust RungErik C Hoffman, DO Primary Cardiologist: Allred Electrophysiologist: Allred Dry Weight: 118 lb  Bi-V Pacing:  >99%       Heart Failure questions reviewed, pt asymptomatic   Thoracic impedance abnormal suggesting fluid accumulation 06/02/2016 to 06/06/2016 and 06/12/2016 to 06/14/2016 and returned to normal 06/14/2016.  Recommendations: No changes.  Low sodium diet education provided.    Follow-up plan: ICM clinic phone appointment on 07/21/2016.  Copy of ICM check sent to device physician.   ICM trend: 06/19/2016       Karie SodaLaurie S Chae Oommen, RN 06/19/2016 8:45 AM

## 2016-06-27 ENCOUNTER — Other Ambulatory Visit: Payer: Self-pay

## 2016-06-27 MED ORDER — HYDROCODONE-ACETAMINOPHEN 10-325 MG PO TABS: 1.0000 | ORAL_TABLET | Freq: Three times a day (TID) | ORAL | 0 refills | Status: DC | PRN

## 2016-06-27 NOTE — Telephone Encounter (Signed)
Last written 6/1 x 3 rxs. Last OV 03/27/16. Next appt 07/17/16. UDS 03/27/16.

## 2016-06-27 NOTE — Telephone Encounter (Signed)
Requesting hydrocodone to be filled.  

## 2016-06-27 NOTE — Telephone Encounter (Signed)
Rx ready for pick up ; pt called. 

## 2016-07-07 ENCOUNTER — Ambulatory Visit (INDEPENDENT_AMBULATORY_CARE_PROVIDER_SITE_OTHER): Payer: Medicare Other | Admitting: Internal Medicine

## 2016-07-07 VITALS — BP 137/103 | HR 78 | Temp 98.0°F | Ht 66.0 in | Wt 111.0 lb

## 2016-07-07 DIAGNOSIS — Z23 Encounter for immunization: Secondary | ICD-10-CM | POA: Diagnosis not present

## 2016-07-07 DIAGNOSIS — G4452 New daily persistent headache (NDPH): Secondary | ICD-10-CM | POA: Diagnosis present

## 2016-07-07 DIAGNOSIS — Z79899 Other long term (current) drug therapy: Secondary | ICD-10-CM | POA: Diagnosis not present

## 2016-07-07 DIAGNOSIS — E785 Hyperlipidemia, unspecified: Secondary | ICD-10-CM | POA: Diagnosis not present

## 2016-07-07 DIAGNOSIS — I10 Essential (primary) hypertension: Secondary | ICD-10-CM

## 2016-07-07 DIAGNOSIS — R519 Headache, unspecified: Secondary | ICD-10-CM | POA: Insufficient documentation

## 2016-07-07 DIAGNOSIS — R634 Abnormal weight loss: Secondary | ICD-10-CM | POA: Diagnosis not present

## 2016-07-07 DIAGNOSIS — Z0000 Encounter for general adult medical examination without abnormal findings: Secondary | ICD-10-CM

## 2016-07-07 DIAGNOSIS — R51 Headache: Secondary | ICD-10-CM

## 2016-07-07 DIAGNOSIS — Z7289 Other problems related to lifestyle: Secondary | ICD-10-CM

## 2016-07-07 DIAGNOSIS — Z87891 Personal history of nicotine dependence: Secondary | ICD-10-CM | POA: Diagnosis not present

## 2016-07-07 DIAGNOSIS — Z114 Encounter for screening for human immunodeficiency virus [HIV]: Secondary | ICD-10-CM

## 2016-07-07 MED ORDER — CYCLOBENZAPRINE HCL 5 MG PO TABS: 5.0000 mg | ORAL_TABLET | Freq: Three times a day (TID) | ORAL | 0 refills | Status: AC | PRN

## 2016-07-07 MED ORDER — SERTRALINE HCL 50 MG PO TABS: 50.0000 mg | ORAL_TABLET | Freq: Every day | ORAL | 3 refills | Status: DC

## 2016-07-07 NOTE — Assessment & Plan Note (Signed)
BP Readings from Last 3 Encounters:  07/07/16 (!) 137/103  04/14/16 118/68  03/27/16 116/72  It was high during this visit, might be due to pain. We will reassess during follow-up.

## 2016-07-07 NOTE — Progress Notes (Signed)
CC: Persistent headache for 3 weeks.  HPI:  [name] is a 70 y.o. with past medical history as listed below came to the clinic with complaint of persistent generalized headache for 3 weeks. Headache is all over his head, occasionally felt more at his occipital region, back of neck and occasionally radiates to right shoulder. He describes headache as dull ache most of the time but can become throbbing in between. He states that headache fluctuate in intensity but he is never free of headaches for the past 3 weeks. It is associated with nausea at its extremes but denies any vomiting. He denies any excessive lacrimation or conjunctival erythema. He hasn't history of adolescent migraine but this pain is different. He do get mild relief by lying in dark and quiet room. He is using oxycodone for his  backache with minimal relief for his headache. He denies any change in his vision, sinus congestion, productive cough or any localized weakness. He has an history of some questionable pulmonary nodules during a CT done in September 2016, for which he was advised to repeat CT in 6 month. He states that he has an upcoming appointment with the pulmonologist at [facility] later this month. He has a past medical history of cervical degenerative disc disease and carotid bypass done for subclavian steal syndrome. He complains of loss of appetite and states that he has lost some weight, her weight is between 108-120 over last 1 year.  He complains of bilateral lower leg cramps which has decreased after stopping his simvastatin. He states that now he takes once or twice weekly.  He quit smoking in June 2017 and no history of recent alcohol or illicit drug use.   Past Medical History:  Diagnosis Date  . Alcoholism (HCC)   . Cervical spondylosis   . Chronic diastolic CHF (congestive heart failure) (HCC)   . Complete heart block (HCC)    a. h/o permanent atrial fibrillation s/p [employer] BiV pacemaker  implanted by Dr. Bard HerbertSimmonds [facility] in 12/2014 with concomitant AVN ablation.  [name]. COPD (chronic obstructive pulmonary disease) (HCC)   . Coronary artery calcification seen on CT scan 02/2015   a. Lexiscan nuclear stress testing 08/01/15 which was low risk, no reversible ischemia, EF 56%..  . Depression   . Essential hypertension   . GERD (gastroesophageal reflux disease)    Pt reports having EGD/colonoscopy in 2010 by Dr. Chales AbrahamsGupta in [place] wich showed ulcers on uppper and normal colon. No report in emr.   . Hyperlipidemia    Pt typically has low HDL and low LDL.   [name]. Low back pain   . Permanent atrial fibrillation (HCC)   . PVD (peripheral vascular disease) (HCC)    a. followed by vascular surgery (prior carotid subclavian bypass for subclavian steal ~03, bilateral aortoiliac and superficial femoral occlusive disease with calf claudication being managed medically.  . Right knee pain 8/10   Began to complain of after requesting a scooter.  Exam was negative and it was decided that pt should continue to ambulate.   . Subclavian steal syndrome    HX of.   . Tobacco abuse     Review of Systems:  As per HPI  Physical Exam:  Vitals:   07/07/16 1325  BP: (!) 137/103  Pulse: 78  Temp: 98 F (36.7 C)  TempSrc: Oral  SpO2: 91%  Weight: 111 lb (50.3 kg)  Height: 5\' 6"  (1.676 m)   Gen. Lean man, looks in pain.  Neck. Supple, nonrestricted range of motion. Head. Normocephalic, atraumatic, nontender. Eyes.PERRLA, no papillary edema. Lungs. Clear bilaterally CV.Irregular, paced rhythm. Abdomen. Soft, nontender, nondistended, bowel sounds positive. Extremities. Multiple ecchymoses of different age present on his arms bilaterally. No edema, no cyanosis Pulses. 2+ bilaterally. Neuro. Alert and oriented, cranial nerves grossly intact. With some decrease in his perifrontal region.  Strength 5/ 5 bilaterally, sensations intact.   Assessment & Plan:   See Encounters Tab for problem based  charting.  Patient seen with Dr. Josem Massey

## 2016-07-07 NOTE — Assessment & Plan Note (Signed)
He gives an history of weight loss, his weight varies between 108-120 pound over the last 1 year. He has an history of Questionable pulmonary nodule and needs follow-up on that. He has his upcoming appointment with pul[facility]ologist at [facility] later this month.

## 2016-07-07 NOTE — Assessment & Plan Note (Signed)
He was given a flu shot today. 

## 2016-07-07 NOTE — Assessment & Plan Note (Signed)
He states that her lower leg cramps has improved after decreasing his simvastatin to 40 mg weekly. He has an extensive history of peripheral vascular disease and needs statin. We will ask Dr. 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

## 2016-07-07 NOTE — Assessment & Plan Note (Signed)
He came to the clinic with complaint of persistent generalized headache for 3 weeks. Headache is all over his head, occasionally felt more at his occipital region, back of neck and occasionally radiates to right shoulder. He describes headache as dull ache most of the time but can become throbbing in between. He states that headache fluctuate in intensity but he is never free of headaches for the past 3 weeks. It is associated with nausea at its extremes but denies any vomiting. He denies any excessive lacrimation or conjunctival erythema. He hasn't history of adolescent migraine but this pain is different. He do get mild relief by lying in dark and quiet room. He is using oxycodone for his  backache with minimal relief for his headache. He denies any change in his vision, sinus congestion, productive cough or any localized weakness. Assessment and plan. Our differential for his headache include 1. Any space-occupying lesion in brain. Although he has normal neuro exam and no papillary edema. He has this questionable history of pulmonary nodule and a chronic smoker, also give an history of unintentional weight loss and decrease in appetite. We will do CT head with contrast as unable to do MRI due to his pacemaker. 2. Chronic infection. Although no history of recent fever or any signs of infection. He gives no history of sick contact, TB contact. We will check CBC, BMP, and HIV testing to rule out any possibility of a chronic infection. Might consider LP if needed. 3. Cervical degenerative disc disease. Might be a possibility, although no recent signs of cervical radiculopathy and his recent CTA neck was without any acute changes. 4. Analgesic use. He has any history of chronic use of oxycodone for his back ache, that can cause rebound headache. If we cannot found any structural brain abnormality or any chronic infection causing this headache, we will try weaning off him from his chronic pain  medicine. 5. Zoloft can cause headaches, although less likely due to his long-term use of 10 years.

## 2016-07-07 NOTE — Patient Instructions (Addendum)
It was pleasure taking care of you today. I am giving you muscle relaxant, you can try this with your pain medicine to see if it helps. We are doing some blood tests and a CT head to find out the cause of your headache. If we cannot find any cause with these, we will try to decrease your pain medicines as they might cause headaches. Zoloft can be a possible cause but unlikely as you are taking this for 10 years. F/U after your CT for results.

## 2016-07-08 LAB — HIV ANTIBODY (ROUTINE TESTING W REFLEX): HIV SCREEN 4TH GENERATION: NONREACTIVE

## 2016-07-08 LAB — CBC WITH DIFFERENTIAL/PLATELET
BASOS ABS: 0.1 10*3/uL (ref 0.0–0.2)
Basos: 1 %
EOS (ABSOLUTE): 0.1 10*3/uL (ref 0.0–0.4)
Eos: 2 %
HEMATOCRIT: 38.8 % (ref 37.5–51.0)
HEMOGLOBIN: 13 g/dL (ref 12.6–17.7)
Immature Grans (Abs): 0 10*3/uL (ref 0.0–0.1)
Immature Granulocytes: 0 %
LYMPHS ABS: 0.7 10*3/uL (ref 0.7–3.1)
Lymphs: 10 %
MCH: 32 pg (ref 26.6–33.0)
MCHC: 33.5 g/dL (ref 31.5–35.7)
MCV: 96 fL (ref 79–97)
MONOCYTES: 10 %
Monocytes Absolute: 0.6 10*3/uL (ref 0.1–0.9)
NEUTROS ABS: 5.2 10*3/uL (ref 1.4–7.0)
Neutrophils: 77 %
Platelets: 239 10*3/uL (ref 150–379)
RBC: 4.06 x10E6/uL — ABNORMAL LOW (ref 4.14–5.80)
RDW: 14.6 % (ref 12.3–15.4)
WBC: 6.7 10*3/uL (ref 3.4–10.8)

## 2016-07-08 LAB — BMP8+ANION GAP
Anion Gap: 16 mmol/L (ref 10.0–18.0)
BUN / CREAT RATIO: 27 — AB (ref 10–24)
BUN: 14 mg/dL (ref 8–27)
CHLORIDE: 100 mmol/L (ref 96–106)
CO2: 25 mmol/L (ref 18–29)
Calcium: 9.2 mg/dL (ref 8.6–10.2)
Creatinine, Ser: 0.52 mg/dL — ABNORMAL LOW (ref 0.76–1.27)
GFR calc non Af Amer: 110 mL/min/{1.73_m2} (ref >59)
GFR, EST AFRICAN AMERICAN: 127 mL/min/{1.73_m2} (ref >59)
GLUCOSE: 95 mg/dL (ref 65–99)
Potassium: 4.4 mmol/L (ref 3.5–5.2)
SODIUM: 141 mmol/L (ref 134–144)

## 2016-07-08 NOTE — Progress Notes (Signed)
Patient ID: Leonard LeekJohn W Kraszewski, male   DOB: [birth year], 70 y.o.   MRN: [id number]  I saw and evaluated the patient. I personally confirmed the key portions of Dr. Shanda BumpsAmin's history and exam and reviewed pertinent patient test results. The assessment, diagnosis, and plan were formulated together and I agree with the documentation in the resident's note.

## 2016-07-17 ENCOUNTER — Ambulatory Visit (INDEPENDENT_AMBULATORY_CARE_PROVIDER_SITE_OTHER): Payer: Medicare Other | Admitting: Internal Medicine

## 2016-07-17 ENCOUNTER — Encounter: Payer: Medicare Other | Admitting: Internal Medicine

## 2016-07-17 ENCOUNTER — Encounter: Payer: Self-pay | Admitting: Internal Medicine

## 2016-07-17 VITALS — BP 160/81 | HR 74 | Temp 97.8°F | Ht 66.0 in | Wt 112.0 lb

## 2016-07-17 DIAGNOSIS — M542 Cervicalgia: Secondary | ICD-10-CM

## 2016-07-17 DIAGNOSIS — J449 Chronic obstructive pulmonary disease, unspecified: Secondary | ICD-10-CM

## 2016-07-17 DIAGNOSIS — G894 Chronic pain syndrome: Secondary | ICD-10-CM | POA: Diagnosis not present

## 2016-07-17 DIAGNOSIS — I10 Essential (primary) hypertension: Secondary | ICD-10-CM

## 2016-07-17 DIAGNOSIS — F17211 Nicotine dependence, cigarettes, in remission: Secondary | ICD-10-CM

## 2016-07-17 DIAGNOSIS — G4452 New daily persistent headache (NDPH): Secondary | ICD-10-CM

## 2016-07-17 DIAGNOSIS — Z7901 Long term (current) use of anticoagulants: Secondary | ICD-10-CM | POA: Diagnosis not present

## 2016-07-17 DIAGNOSIS — Z79899 Other long term (current) drug therapy: Secondary | ICD-10-CM | POA: Diagnosis not present

## 2016-07-17 DIAGNOSIS — I739 Peripheral vascular disease, unspecified: Secondary | ICD-10-CM

## 2016-07-17 DIAGNOSIS — E785 Hyperlipidemia, unspecified: Secondary | ICD-10-CM

## 2016-07-17 DIAGNOSIS — Z7982 Long term (current) use of aspirin: Secondary | ICD-10-CM | POA: Diagnosis not present

## 2016-07-17 DIAGNOSIS — I482 Chronic atrial fibrillation, unspecified: Secondary | ICD-10-CM

## 2016-07-17 DIAGNOSIS — Z951 Presence of aortocoronary bypass graft: Secondary | ICD-10-CM

## 2016-07-17 DIAGNOSIS — Z7951 Long term (current) use of inhaled steroids: Secondary | ICD-10-CM

## 2016-07-17 DIAGNOSIS — R51 Headache: Secondary | ICD-10-CM | POA: Diagnosis not present

## 2016-07-17 DIAGNOSIS — I2510 Atherosclerotic heart disease of native coronary artery without angina pectoris: Secondary | ICD-10-CM | POA: Diagnosis not present

## 2016-07-17 DIAGNOSIS — I779 Disorder of arteries and arterioles, unspecified: Secondary | ICD-10-CM

## 2016-07-17 MED ORDER — HYDROCODONE-ACETAMINOPHEN 10-325 MG PO TABS: 1.0000 | ORAL_TABLET | Freq: Three times a day (TID) | ORAL | 0 refills | Status: AC | PRN

## 2016-07-17 NOTE — Progress Notes (Signed)
[facility] Subjective:  HPI: [name] is a 70 y.o. male with a PMH of PVD, A fib, chronic neck and back pain, dCHF who presents for follow up of hypertension and neck pain. He reports he is taking his medications as prescribed and has no concerns with the medication. For his chronic neck and back pain the Norco is helping him to remain functional around the house.  Pain is usually a 5-6/10. His most pressing concern however is a headache.  He was seen in our clinic about 2 weeks ago for a new headache.  He reports that the pain originates in his neck and that it wraps around to the front of his head.  He does report that his pain is particularly bad at his right temporal region today.  The pain is not significantly improved when he takes the norco.  He reports for about the last month he has had a constant headache and it has not resolved but has varied in intensity.  A CT scan of his head has been ordered, he reports he plans to get this done in [place] as it is easier for him to get to.  He denies any blurry vision, changes in vision, new numbness, tingling, tinnitus, associated with the headache.      Review of Systems: Per HPI Objective:  Physical Exam: Vitals:   07/17/16 0854  BP: (!) 160/81  Pulse: 74  Temp: 97.8 F (36.6 C)  TempSrc: Oral  SpO2: 95%  Weight: 112 lb (50.8 kg)  Height: '5\' 6"'$  (1.676 m)  Physical Exam  Constitutional: No distress.  thin  Eyes: Conjunctivae and EOM are normal.  Cardiovascular: Normal rate.   Pulmonary/Chest: Effort normal and breath sounds normal. No respiratory distress. He has no wheezes.  Abdominal: Soft. Bowel sounds are normal.  Musculoskeletal:  Tenderness of cervical paraspinal musculature  Nursing note and vitals reviewed.    Assessment & Plan:  Essential hypertension Assessment: Essential HTN uncontrolled  Plan: Continue metprolol Follow up in 1 month (elevated blood pressure today may be  related to the headache)  Carotid arterial disease Assessment: Carotid arterial disease s/p left carotid bypass graft  Plan: Following with Drs Kellie Simmering and Donzetta Matters at [facility].   Continue simvastatin '40mg'$  daily  Chronic atrial fibrillation ([place]) Assessment: Chronic afib  Plan: Rate controlled with Metoprolol Sussinate '100mg'$  dialy A/C with eliquis '5mg'$  BID  COPD, severe Assessment: COPD severe  Plan: Stable continue symbicort, duonebs PRN  Chronic pain syndrome Assessment: Chronic pain syndrome  Plan: Use has been appropriate,  Will refill 1 month prescription.  It is possible that is headache is due to medication overuse and if other cause is not found we may need to taper his medication.  Hyperlipidemia Assessment: HLD  Plan: Continue simvastatin '40mg'$  daily.  Headache Assessment: Headache  Plan: Given this new headache we first need to obtain imaging of his brain which has been ordered.  He does have a lot of neck pain and this headache may be a tension headache from his chronic neck pain however his pain medication does not seem to be helping.  It is possible that he is having a medication overuse headache although I would expect the medication to help some when he takes it.   To rule out temporal arteritis I have obtained a sed rate today that is low.   Medications Ordered Meds ordered this encounter  Medications  . HYDROcodone-acetaminophen (NORCO) 10-325 MG tablet    Sig:  Take 1 tablet by mouth every 8 (eight) hours as needed for severe pain.    Dispense:  90 tablet    Refill:  0    Rx 1/1 To be filled 30 days after last Rx   Other Orders Orders Placed This Encounter  Procedures  . Sed Rate (ESR)   Follow Up: Return in about 4 weeks (around 08/14/2016).

## 2016-07-17 NOTE — Patient Instructions (Signed)
I want to see you back in 1 month.   I will call you to discuss the results of your CT when I receive them.

## 2016-07-18 ENCOUNTER — Encounter (HOSPITAL_COMMUNITY): Payer: Self-pay | Admitting: Emergency Medicine

## 2016-07-18 ENCOUNTER — Emergency Department (HOSPITAL_COMMUNITY): Payer: Medicare Other

## 2016-07-18 ENCOUNTER — Inpatient Hospital Stay (HOSPITAL_COMMUNITY)
Admission: EM | Admit: 2016-07-18 | Discharge: 2016-07-22 | DRG: 190 | Disposition: A | Discharge status: Home / Self Care | Payer: Medicare Other | Attending: Internal Medicine | Admitting: Internal Medicine

## 2016-07-18 ENCOUNTER — Other Ambulatory Visit: Payer: Self-pay

## 2016-07-18 DIAGNOSIS — Z66 Do not resuscitate: Secondary | ICD-10-CM | POA: Diagnosis present

## 2016-07-18 DIAGNOSIS — I2510 Atherosclerotic heart disease of native coronary artery without angina pectoris: Secondary | ICD-10-CM | POA: Diagnosis present

## 2016-07-18 DIAGNOSIS — Z950 Presence of cardiac pacemaker: Secondary | ICD-10-CM

## 2016-07-18 DIAGNOSIS — I951 Orthostatic hypotension: Secondary | ICD-10-CM

## 2016-07-18 DIAGNOSIS — I5032 Chronic diastolic (congestive) heart failure: Secondary | ICD-10-CM | POA: Diagnosis present

## 2016-07-18 DIAGNOSIS — G8929 Other chronic pain: Secondary | ICD-10-CM | POA: Diagnosis present

## 2016-07-18 DIAGNOSIS — Z8249 Family history of ischemic heart disease and other diseases of the circulatory system: Secondary | ICD-10-CM

## 2016-07-18 DIAGNOSIS — J441 Chronic obstructive pulmonary disease with (acute) exacerbation: Secondary | ICD-10-CM | POA: Diagnosis present

## 2016-07-18 DIAGNOSIS — R441 Visual hallucinations: Secondary | ICD-10-CM | POA: Diagnosis present

## 2016-07-18 DIAGNOSIS — I482 Chronic atrial fibrillation, unspecified: Secondary | ICD-10-CM

## 2016-07-18 DIAGNOSIS — Z7951 Long term (current) use of inhaled steroids: Secondary | ICD-10-CM

## 2016-07-18 DIAGNOSIS — R0600 Dyspnea, unspecified: Secondary | ICD-10-CM

## 2016-07-18 DIAGNOSIS — R64 Cachexia: Secondary | ICD-10-CM | POA: Diagnosis present

## 2016-07-18 DIAGNOSIS — M47812 Spondylosis without myelopathy or radiculopathy, cervical region: Secondary | ICD-10-CM | POA: Diagnosis present

## 2016-07-18 DIAGNOSIS — Z79899 Other long term (current) drug therapy: Secondary | ICD-10-CM

## 2016-07-18 DIAGNOSIS — G43909 Migraine, unspecified, not intractable, without status migrainosus: Secondary | ICD-10-CM | POA: Diagnosis present

## 2016-07-18 DIAGNOSIS — Z681 Body mass index (BMI) 19 or less, adult: Secondary | ICD-10-CM | POA: Diagnosis not present

## 2016-07-18 DIAGNOSIS — J440 Chronic obstructive pulmonary disease with acute lower respiratory infection: Secondary | ICD-10-CM | POA: Diagnosis present

## 2016-07-18 DIAGNOSIS — I110 Hypertensive heart disease with heart failure: Secondary | ICD-10-CM | POA: Diagnosis present

## 2016-07-18 DIAGNOSIS — E785 Hyperlipidemia, unspecified: Secondary | ICD-10-CM | POA: Diagnosis present

## 2016-07-18 DIAGNOSIS — E43 Unspecified severe protein-calorie malnutrition: Secondary | ICD-10-CM | POA: Diagnosis present

## 2016-07-18 DIAGNOSIS — Z87891 Personal history of nicotine dependence: Secondary | ICD-10-CM | POA: Diagnosis not present

## 2016-07-18 DIAGNOSIS — I739 Peripheral vascular disease, unspecified: Secondary | ICD-10-CM | POA: Diagnosis present

## 2016-07-18 DIAGNOSIS — J209 Acute bronchitis, unspecified: Secondary | ICD-10-CM | POA: Diagnosis present

## 2016-07-18 DIAGNOSIS — R000 Tachycardia, unspecified: Secondary | ICD-10-CM | POA: Diagnosis not present

## 2016-07-18 LAB — BASIC METABOLIC PANEL
ANION GAP: 13 (ref 5–15)
Anion gap: 13 (ref 5–15)
BUN: 27 mg/dL — ABNORMAL HIGH (ref 6–20)
BUN: 27 mg/dL — ABNORMAL HIGH (ref 6–20)
CALCIUM: 9.4 mg/dL (ref 8.9–10.3)
CHLORIDE: 95 mmol/L — AB (ref 101–111)
CHLORIDE: 95 mmol/L — AB (ref 101–111)
CO2: 23 mmol/L (ref 22–32)
CO2: 26 mmol/L (ref 22–32)
CREATININE: 0.65 mg/dL (ref 0.61–1.24)
Calcium: 9.3 mg/dL (ref 8.9–10.3)
Creatinine, Ser: 0.66 mg/dL (ref 0.61–1.24)
GFR calc Af Amer: >60 mL/min (ref >60)
GFR calc non Af Amer: >60 mL/min (ref >60)
Glucose, Bld: 100 mg/dL — ABNORMAL HIGH (ref 65–99)
Glucose, Bld: 95 mg/dL (ref 65–99)
Potassium: 7.3 mmol/L (ref 3.5–5.1)
Potassium: 4.6 mmol/L (ref 3.5–5.1)
SODIUM: 134 mmol/L — AB (ref 135–145)
Sodium: 131 mmol/L — ABNORMAL LOW (ref 135–145)

## 2016-07-18 LAB — I-STAT TROPONIN, ED
TROPONIN I, POC: 0.03 ng/mL (ref 0.00–0.08)
TROPONIN I, POC: 0.02 ng/mL (ref 0.00–0.08)

## 2016-07-18 LAB — I-STAT ARTERIAL BLOOD GAS, ED
ACID-BASE DEFICIT: 3 mmol/L — AB (ref 0.0–2.0)
BICARBONATE: 21.3 mmol/L (ref 20.0–28.0)
O2 SAT: 92 %
PH ART: 7.39 (ref 7.350–7.450)
PO2 ART: 63 mmHg — AB (ref 83.0–108.0)
Patient temperature: 98.6
TCO2: 22 mmol/L (ref 0–100)
pCO2 arterial: 35.2 mmHg (ref 32.0–48.0)

## 2016-07-18 LAB — CBC
HCT: 44.8 % (ref 39.0–52.0)
HEMOGLOBIN: 15.2 g/dL (ref 13.0–17.0)
MCH: 32.5 pg (ref 26.0–34.0)
MCHC: 33.9 g/dL (ref 30.0–36.0)
MCV: 95.9 fL (ref 78.0–100.0)
Platelets: 301 10*3/uL (ref 150–400)
RBC: 4.67 MIL/uL (ref 4.22–5.81)
RDW: 13.5 % (ref 11.5–15.5)
WBC: 8 10*3/uL (ref 4.0–10.5)

## 2016-07-18 LAB — SEDIMENTATION RATE: Sed Rate: 3 mm/hr (ref 0–30)

## 2016-07-18 LAB — BRAIN NATRIURETIC PEPTIDE: B NATRIURETIC PEPTIDE 5: 403.9 pg/mL — AB (ref 0.0–100.0)

## 2016-07-18 MED ORDER — IPRATROPIUM BROMIDE 0.02 % IN SOLN
0.5000 mg | Freq: Once | RESPIRATORY_TRACT | Status: AC
  Administered 2016-07-18: 0.5 mg via RESPIRATORY_TRACT
  Filled 2016-07-18: qty 2.5

## 2016-07-18 MED ORDER — PREDNISONE 20 MG PO TABS
60.0000 mg | ORAL_TABLET | Freq: Once | ORAL | Status: AC
  Administered 2016-07-18: 60 mg via ORAL
  Filled 2016-07-18: qty 3

## 2016-07-18 MED ORDER — ALBUTEROL (5 MG/ML) CONTINUOUS INHALATION SOLN
10.0000 mg/h | INHALATION_SOLUTION | Freq: Once | RESPIRATORY_TRACT | Status: AC
  Administered 2016-07-18: 10 mg/h via RESPIRATORY_TRACT
  Filled 2016-07-18: qty 20

## 2016-07-18 NOTE — ED Notes (Signed)
Dr. Drue SecondSnider paged for BiPAP orders, pt pended to floor that does not take patients on Bipap

## 2016-07-18 NOTE — ED Notes (Signed)
RT called for ABG, pt noted to be having hallucinations, pt states he can see an "intruder" or woman in the vent system looking at him. MD aware.

## 2016-07-18 NOTE — ED Notes (Signed)
MD at bedside. 

## 2016-07-18 NOTE — ED Notes (Signed)
CAT completed, pt states he feels better at this time. Family at bedside

## 2016-07-18 NOTE — ED Notes (Signed)
Main lab tech states they never received sample, specimen will be redrawn

## 2016-07-18 NOTE — ED Provider Notes (Signed)
[facility]-EMERGENCY DEPT Provider Note   CSN: [id number] Arrival date & time: 07/18/16  1836     History   Chief Complaint Chief Complaint  Patient presents with  . Shortness of Breath  . Chest Pain    HPI Leonard Massey is a 70 y.o. male.   Shortness of Breath  This is a new problem. The average episode lasts 1 day. The problem occurs continuously.The current episode started yesterday. The problem has not changed since onset.Associated symptoms include orthopnea and chest pain (tightness). Pertinent negatives include no fever, no sore throat, no ear pain, no cough, no hemoptysis, no vomiting, no abdominal pain, no rash and no leg swelling. He has tried beta-agonist inhalers for the symptoms. Associated medical issues include COPD, CAD and heart failure.    Past Medical History:  Diagnosis Date  . Alcoholism (HCC)   . Cervical spondylosis   . Chronic diastolic CHF (congestive heart failure) (HCC)   . Complete heart block (HCC)    a. h/o permanent atrial fibrillation s/p [employer] Jude BiV pacemaker implanted by Dr. Bard Herbert at [facility] in 12/2014 with concomitant AVN ablation.  [name] COPD (chronic obstructive pulmonary disease) (HCC)   . Coronary artery calcification seen on CT scan 02/2015   a. Lexiscan nuclear stress testing 08/01/15 which was low risk, no reversible ischemia, EF 56%..  . Depression   . Essential hypertension   . GERD (gastroesophageal reflux disease)    Pt reports having EGD/colonoscopy in 2010 by Dr. [name] in [place] wich showed ulcers on uppper and normal colon. No report in emr.   . Hyperlipidemia    Pt typically has low HDL and low LDL.   [name] Low back pain   . Permanent atrial fibrillation (HCC)   . PVD (peripheral vascular disease) (HCC)    a. followed by vascular surgery (prior carotid subclavian bypass for subclavian steal ~03, bilateral aortoiliac and superficial femoral occlusive disease with calf claudication being managed medically.  . Right knee pain 8/10   Began to complain of after requesting a scooter.  Exam was negative and it was decided that pt should continue to ambulate.   . Subclavian steal syndrome    HX of.   . Tobacco abuse     Patient Active Problem List   Diagnosis Date Noted  . Headache 07/07/2016  . Body mass index (BMI) of 19 or less in adult 03/27/2016  . PAD (peripheral artery disease) (HCC) 12/25/2015  . PVD (peripheral vascular disease) (HCC)   . Diverticulosis of colon 06/29/2015  . Ear drainage right 06/17/2015  . Complete heart block (HCC) 04/12/2015  . Coronary artery calcification seen on CT scan 02/25/2015  . Abnormal screening CT of chest 12/20/2014  . Long term current use of opiate analgesic 11/15/2014  . Malaise and fatigue 09/07/2014  . Loss of weight 06/01/2014  . Chronic pain syndrome 01/12/2014  . Routine adult health maintenance 01/12/2014  . Permanent atrial fibrillation (HCC) 10/24/2013  . Long term current use of anticoagulant therapy- Eliquis 10/24/2013  . Decreased right shoulder range of motion 06/02/2013  . Tobacco use disorder 06/02/2013  . Carotid arterial disease (HCC) 01/18/2013  . Numbness and tingling in right hand 10/07/2012  . KNEE PAIN, RIGHT, CHRONIC 06/14/2009  . LOW BACK PAIN SYNDROME 10/15/2006  . Hyperlipidemia 09/09/2006  . ABUSE, ALCOHOL, IN REMISSION 09/09/2006  . DEPRESSION 09/09/2006  . Essential hypertension 09/09/2006  . 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 COPD, severe (HCC) 09/09/2006  . GERD 09/09/2006  .  CAROTID ENDARTERECTOMY, HX OF 09/09/2006    Past Surgical History:  Procedure Laterality Date  . Anterior cervical diskectomy and fusion at C4-5 and C5-6  2002   Dr. Wynetta Emeryram  . AV nodal ablation  3/16   [facility]  . carotid endartectomy Left   . left common carotid to subclavian artery bypass  04/1999  . PACEMAKER INSERTION  01/04/2015   SJM PM [id number] CRT-P implanted by Dr Sharol HarnessSimmons [facility] for AV block s/p AV nodal ablation       Home  Medications    Prior to Admission medications   Medication Sig Start Date End Date Taking? Authorizing Provider  albuterol (PROAIR HFA) 108 (90 BASE) MCG/ACT inhaler Inhale 1-2 puffs into the lungs every 6 (six) hours as needed for wheezing or shortness of breath.    Historical Provider, MD  ALPRAZolam Prudy Feeler(XANAX) 0.25 MG tablet Take 1 tablet by mouth three times a day 07/25/15   Historical Provider, MD  aspirin (ASPIRIN EC) 81 MG EC tablet Take 81 mg by mouth daily. Swallow whole.    Historical Provider, MD  azithromycin (ZITHROMAX) 500 MG tablet Take 500 mg by mouth daily. 04/01/16   Historical Provider, MD  budesonide (PULMICORT) 0.5 MG/2ML nebulizer solution INHALE 1 VIAL VIA NEBULIZER TWICE DAILY 05/10/15   Historical Provider, MD  buPROPion (ZYBAN) 150 MG 12 hr tablet One pill daily for 3 days then twice daily for 12 weeks for smoking cessation 12/27/15   Gust RungErik C Hoffman, DO  cyclobenzaprine (FLEXERIL) 5 MG tablet Take 1 tablet (5 mg total) by mouth 3 (three) times daily as needed for muscle spasms. 07/07/16   Arnetha CourserSumayya Amin, MD  ELIQUIS 5 MG TABS tablet TAKE 1 TABLET(5 MG) BY MOUTH TWICE DAILY 05/13/16   Gust RungErik C Hoffman, DO  furosemide (LASIX) 40 MG tablet Take 1 tablet by mouth daily as needed. Swelling 07/05/15   Historical Provider, MD  HYDROcodone-acetaminophen (NORCO) 10-325 MG tablet Take 1 tablet by mouth every 8 (eight) hours as needed for severe pain. 07/17/16   Gust RungErik C Hoffman, DO  ipratropium-albuterol (DUONEB) 0.5-2.5 (3) MG/3ML SOLN Inhale the contents of 1 vial by nebulization four times a day 01/16/15   Historical Provider, MD  metoprolol succinate (TOPROL XL) 100 MG 24 hr tablet Take 1 tablet (100 mg total) by mouth daily. Take with or immediately following a meal. 03/08/16 03/07/17  Gust RungErik C Hoffman, DO  potassium chloride SA (K-DUR,KLOR-CON) 20 MEQ tablet Take 1 tablet by mouth as directed. Take on the days you take Lasix 07/05/15   Historical Provider, MD  sertraline (ZOLOFT) 50 MG tablet Take 1  tablet (50 mg total) by mouth daily. 07/07/16   Arnetha CourserSumayya Amin, MD  simvastatin (ZOCOR) 40 MG tablet TAKE 1 TABLET(40 MG) BY MOUTH AT BEDTIME 03/31/16   Nischal Narendra, MD  simvastatin (ZOCOR) 40 MG tablet Take 40 mg by mouth daily. 06/17/13   Historical Provider, MD  SYMBICORT 160-4.5 MCG/ACT inhaler INHALE 2 PUFFS INTO THE LUNGS TWICE DAILY 06/07/15   Historical Provider, MD    Family History Family History  Problem Relation Age of Onset  . Heart failure Mother   . Heart attack Mother   . Hypertension Sister   . Heart attack Sister   . Stroke Neg Hx     Social History Social History  Substance Use Topics  . Smoking status: Former Smoker    Packs/day: 0.50    Years: 50.00    Types: Cigarettes    Start date:  07/15/2013    Quit date: 03/27/2016  . Smokeless tobacco: Former Neurosurgeon    Quit date: 02/01/2012     Comment: Using E-cigs. pt smoking about 3 cigarettes a month 04-12-15  . Alcohol use No     Allergies   Aspirin and Lasix [furosemide]   Review of Systems Review of Systems  Constitutional: Negative for chills and fever.  HENT: Negative for ear pain and sore throat.   Eyes: Negative for pain and visual disturbance.  Respiratory: Positive for shortness of breath. Negative for cough and hemoptysis.   Cardiovascular: Positive for chest pain (tightness) and orthopnea. Negative for palpitations and leg swelling.  Gastrointestinal: Negative for abdominal pain and vomiting.  Genitourinary: Negative for dysuria and hematuria.  Musculoskeletal: Negative for arthralgias and back pain.  Skin: Negative for color change and rash.  Neurological: Negative for seizures and syncope.  All other systems reviewed and are negative.    Physical Exam Updated Vital Signs BP 146/95   Pulse 73   Temp 98.3 F (36.8 C)   Resp 19   SpO2 96%   Physical Exam  Constitutional: He is oriented to person, place, and time. He appears well-developed and well-nourished.  HENT:  Head: Normocephalic and  atraumatic.  Eyes: Conjunctivae are normal.  Neck: Neck supple.  Cardiovascular: Normal rate and regular rhythm.   No murmur heard. Pulmonary/Chest: He is in respiratory distress (accessory muscle use). He has wheezes.  Abdominal: Soft. There is no rebound and no guarding.  Musculoskeletal: He exhibits no edema.  Neurological: He is alert and oriented to person, place, and time.  Skin: Skin is warm and dry.  Psychiatric: He has a normal mood and affect.  Nursing note and vitals reviewed.    ED Treatments / Results  Labs (all labs ordered are listed, but only abnormal results are displayed) Labs Reviewed  BASIC METABOLIC PANEL - Abnormal; Notable for the following:       Result Value   Sodium 131 (*)    Potassium 7.3 (*)    Chloride 95 (*)    Glucose, Bld 100 (*)    BUN 27 (*)    All other components within normal limits  BRAIN NATRIURETIC PEPTIDE - Abnormal; Notable for the following:    B Natriuretic Peptide 403.9 (*)    All other components within normal limits  BASIC METABOLIC PANEL - Abnormal; Notable for the following:    Sodium 134 (*)    Chloride 95 (*)    BUN 27 (*)    All other components within normal limits  BASIC METABOLIC PANEL - Abnormal; Notable for the following:    Chloride 93 (*)    Glucose, Bld 181 (*)    BUN 30 (*)    Anion gap 18 (*)    All other components within normal limits  I-STAT ARTERIAL BLOOD GAS, ED - Abnormal; Notable for the following:    pO2, Arterial 63.0 (*)    Acid-base deficit 3.0 (*)    All other components within normal limits  CBC  I-STAT TROPOININ, ED  I-STAT TROPOININ, ED  I-STAT TROPOININ, ED  I-STAT TROPOININ, ED  Rosezena Sensor, ED    EKG  EKG Interpretation  Date/Time:  Friday July 18 2016 18:39:40 EDT Ventricular Rate:  71 PR Interval:    QRS Duration: 109 QT Interval:  478 QTC Calculation: 520 R Axis:   -105 Text Interpretation:  Ventricular-paced rhythm No further analysis attempted due to paced  rhythm Artifact in lead(s) I V5  V6 Confirmed by Bebe Shaggy  MD, DONALD ([id number]) on 07/19/2016 8:20:09 AM       Radiology Dg Chest Portable 1 View  Result Date: 07/18/2016 CLINICAL DATA:  Acute onset of central chest pain and shortness of breath. Decreased O2 saturation. Initial encounter. EXAM: PORTABLE CHEST 1 VIEW COMPARISON:  Chest radiograph and CTA of the chest performed 07/19/2015 FINDINGS: The lungs are hyperexpanded, with flattening of the hemidiaphragms, compatible with COPD. Peribronchial thickening is noted. No definite superimposed focal airspace consolidation is seen. No pleural effusion or pneumothorax is seen. The cardiomediastinal silhouette is within normal limits. A pacemaker is noted overlying the left chest wall, with leads ending overlying the right ventricle and coronary sinus. No acute osseous abnormalities are seen. IMPRESSION: Findings of COPD. Peribronchial thickening noted. No acute focal airspace consolidation seen. Electronically Signed   By: Roanna Raider M.D.   On: 07/18/2016 19:25    Procedures Procedures (including critical care time)  Medications Ordered in ED Medications  albuterol (PROVENTIL) (2.5 MG/3ML) 0.083% nebulizer solution 2.5 mg (not administered)  simvastatin (ZOCOR) tablet 40 mg (not administered)  cyclobenzaprine (FLEXERIL) tablet 5 mg (not administered)  sertraline (ZOLOFT) tablet 50 mg (50 mg Oral Given 07/19/16 0953)  HYDROcodone-acetaminophen (NORCO) 10-325 MG per tablet 1 tablet (1 tablet Oral Given 07/19/16 1111)  apixaban (ELIQUIS) tablet 5 mg (5 mg Oral Given 07/19/16 0953)  mometasone-formoterol (DULERA) 200-5 MCG/ACT inhaler 2 puff (2 puffs Inhalation Given 07/19/16 0846)  predniSONE (DELTASONE) tablet 40 mg (40 mg Oral Given 07/19/16 0852)  aspirin EC tablet 81 mg (81 mg Oral Given 07/19/16 0953)  ipratropium-albuterol (DUONEB) 0.5-2.5 (3) MG/3ML nebulizer solution 3 mL (3 mLs Nebulization Given 07/19/16 0846)  albuterol (PROVENTIL,VENTOLIN)  solution continuous neb (10 mg/hr Nebulization Given 07/18/16 1926)  ipratropium (ATROVENT) nebulizer solution 0.5 mg (0.5 mg Nebulization Given 07/18/16 1926)  predniSONE (DELTASONE) tablet 60 mg (60 mg Oral Given 07/18/16 1928)  sodium chloride 0.9 % bolus 500 mL (500 mLs Intravenous Given 07/19/16 0852)     Initial Impression / Assessment and Plan / ED Course  I have reviewed the triage vital signs and the nursing notes.  Pertinent labs & imaging results that were available during my care of the patient were reviewed by me and considered in my medical decision making (see chart for details).  Clinical Course  Comment By Time  Pt presented to the ED with acute shortness of breath.  He has a history of COPD.  On my exam, he is wheezing, retracting, accessory muscle use.  Will check labs, xrays, steroids, breathing treatments and reassess.  Will likely require admission. Linwood Dibbles, MD 09/22 1919  Pt asked to go home but he is still retracting and not moving a lot of air.  He now is hallucinating.  He thinks there is a woman above his door.  Will check an ABG.  He may be getting hypercapnic.  Plan on admission Linwood Dibbles, MD 09/22 2215    Patient presents for acute shortness of breath.  He is initially found with elevated work of breathing and reduced air movement with wheezes.  No leg swelling.  Given steroids and albuterol treatment with improvement.  CXR obtained, personally reviewed by me, does not demonstrate acute findings.    Given chest pain (described as tightness) and patient's history, troponins were obtained and negative.  EKG shows paced rhythm.  Initial BMP hemolyzed, repeat shows normal K+. Elevated BUN.  BNP lower than priors.  Doubt ACS or CHF exacerbation; most likely  COPD exacerbation.  Patient began having visual hallucinations.  ABG obtained, demonstrates normal Co2, but low O2.  BiPAP started.  Patient will be admitted to the internal medicine service.  Final Clinical  Impressions(s) / ED Diagnoses   Final diagnoses:  COPD exacerbation [facility])    New Prescriptions New Prescriptions   No medications on file     Garey Ham, MD 07/19/16 1134    Linwood Dibbles, MD 07/20/16 2025

## 2016-07-18 NOTE — ED Notes (Signed)
Neb tx running at this time

## 2016-07-18 NOTE — Progress Notes (Signed)
RT placed patient on BIPAP per MD order.Patient placed on 10/5 and 40% Patient tolerating well

## 2016-07-18 NOTE — ED Triage Notes (Signed)
Pt arrives via ems from home for c/o central chest pain and sob that began around 2230 last pm. Ems reports they gave pt 1 sl nitro, 324mg  asa and 1 neb treatment. Pt initially in the 80's on room air, pts sats 96% on room air with accessory muscle use and labored breathing present. Pt a/ox4,

## 2016-07-18 NOTE — ED Notes (Signed)
Critical K reported by lab, tech reports hemolyzation present, specimen will be redrawn

## 2016-07-18 NOTE — ED Notes (Signed)
Xray at bedside, RT called for CAT

## 2016-07-18 NOTE — ED Notes (Signed)
Pt and family requesting an update when results are back

## 2016-07-19 ENCOUNTER — Encounter (HOSPITAL_COMMUNITY): Payer: Self-pay | Admitting: General Practice

## 2016-07-19 DIAGNOSIS — R64 Cachexia: Secondary | ICD-10-CM

## 2016-07-19 DIAGNOSIS — I482 Chronic atrial fibrillation, unspecified: Secondary | ICD-10-CM

## 2016-07-19 DIAGNOSIS — I951 Orthostatic hypotension: Secondary | ICD-10-CM

## 2016-07-19 DIAGNOSIS — J441 Chronic obstructive pulmonary disease with (acute) exacerbation: Secondary | ICD-10-CM

## 2016-07-19 DIAGNOSIS — R5381 Other malaise: Secondary | ICD-10-CM

## 2016-07-19 LAB — BASIC METABOLIC PANEL
Anion gap: 18 — ABNORMAL HIGH (ref 5–15)
BUN: 30 mg/dL — AB (ref 6–20)
CHLORIDE: 93 mmol/L — AB (ref 101–111)
CO2: 24 mmol/L (ref 22–32)
Calcium: 9.6 mg/dL (ref 8.9–10.3)
Creatinine, Ser: 0.87 mg/dL (ref 0.61–1.24)
GFR calc Af Amer: >60 mL/min (ref >60)
GFR calc non Af Amer: >60 mL/min (ref >60)
GLUCOSE: 181 mg/dL — AB (ref 65–99)
POTASSIUM: 4.1 mmol/L (ref 3.5–5.1)
Sodium: 135 mmol/L (ref 135–145)

## 2016-07-19 LAB — I-STAT TROPONIN, ED: Troponin i, poc: 0.02 ng/mL (ref 0.00–0.08)

## 2016-07-19 MED ORDER — SODIUM CHLORIDE 0.9 % IV SOLN
INTRAVENOUS | Status: AC
  Administered 2016-07-19 (×2): via INTRAVENOUS

## 2016-07-19 MED ORDER — DIPHENHYDRAMINE HCL 50 MG/ML IJ SOLN
25.0000 mg | Freq: Once | INTRAMUSCULAR | Status: DC
  Filled 2016-07-19: qty 1

## 2016-07-19 MED ORDER — SERTRALINE HCL 50 MG PO TABS
50.0000 mg | ORAL_TABLET | Freq: Every day | ORAL | Status: DC
  Administered 2016-07-19 – 2016-07-22 (×4): 50 mg via ORAL
  Filled 2016-07-19 (×4): qty 1

## 2016-07-19 MED ORDER — ALBUTEROL SULFATE (2.5 MG/3ML) 0.083% IN NEBU: 2.5000 mg | INHALATION_SOLUTION | Freq: Four times a day (QID) | RESPIRATORY_TRACT | Status: DC | PRN

## 2016-07-19 MED ORDER — LEVALBUTEROL HCL 0.63 MG/3ML IN NEBU
0.6300 mg | INHALATION_SOLUTION | Freq: Four times a day (QID) | RESPIRATORY_TRACT | Status: DC
  Filled 2016-07-19: qty 3

## 2016-07-19 MED ORDER — CYCLOBENZAPRINE HCL 10 MG PO TABS
5.0000 mg | ORAL_TABLET | Freq: Three times a day (TID) | ORAL | Status: DC | PRN
  Administered 2016-07-21: 5 mg via ORAL
  Filled 2016-07-19: qty 1

## 2016-07-19 MED ORDER — PROCHLORPERAZINE EDISYLATE 5 MG/ML IJ SOLN
10.0000 mg | Freq: Once | INTRAMUSCULAR | Status: DC
  Filled 2016-07-19: qty 2

## 2016-07-19 MED ORDER — LEVALBUTEROL HCL 0.63 MG/3ML IN NEBU: 0.6300 mg | INHALATION_SOLUTION | Freq: Four times a day (QID) | RESPIRATORY_TRACT | Status: DC

## 2016-07-19 MED ORDER — POTASSIUM CHLORIDE CRYS ER 20 MEQ PO TBCR: 20.0000 meq | EXTENDED_RELEASE_TABLET | ORAL | Status: DC

## 2016-07-19 MED ORDER — HYDROCODONE-ACETAMINOPHEN 10-325 MG PO TABS
1.0000 | ORAL_TABLET | Freq: Three times a day (TID) | ORAL | Status: DC | PRN
  Administered 2016-07-19 – 2016-07-22 (×6): 1 via ORAL
  Filled 2016-07-19 (×6): qty 1

## 2016-07-19 MED ORDER — IPRATROPIUM BROMIDE 0.02 % IN SOLN: 0.5000 mg | Freq: Four times a day (QID) | RESPIRATORY_TRACT | Status: DC

## 2016-07-19 MED ORDER — MOMETASONE FURO-FORMOTEROL FUM 200-5 MCG/ACT IN AERO
2.0000 | INHALATION_SPRAY | Freq: Two times a day (BID) | RESPIRATORY_TRACT | Status: DC
  Administered 2016-07-19 – 2016-07-20 (×3): 2 via RESPIRATORY_TRACT
  Filled 2016-07-19: qty 8.8

## 2016-07-19 MED ORDER — SODIUM CHLORIDE 0.9 % IV BOLUS (SEPSIS)
500.0000 mL | Freq: Once | INTRAVENOUS | Status: AC
  Administered 2016-07-19: 500 mL via INTRAVENOUS

## 2016-07-19 MED ORDER — IPRATROPIUM BROMIDE 0.02 % IN SOLN
0.5000 mg | Freq: Four times a day (QID) | RESPIRATORY_TRACT | Status: DC
  Administered 2016-07-19: 0.5 mg via RESPIRATORY_TRACT
  Filled 2016-07-19: qty 2.5

## 2016-07-19 MED ORDER — ASPIRIN EC 81 MG PO TBEC
81.0000 mg | DELAYED_RELEASE_TABLET | Freq: Every day | ORAL | Status: DC
  Administered 2016-07-19 – 2016-07-22 (×4): 81 mg via ORAL
  Filled 2016-07-19 (×4): qty 1

## 2016-07-19 MED ORDER — IPRATROPIUM-ALBUTEROL 0.5-2.5 (3) MG/3ML IN SOLN
3.0000 mL | Freq: Four times a day (QID) | RESPIRATORY_TRACT | Status: DC
  Administered 2016-07-19 (×2): 3 mL via RESPIRATORY_TRACT
  Filled 2016-07-19 (×2): qty 3

## 2016-07-19 MED ORDER — SIMVASTATIN 40 MG PO TABS
40.0000 mg | ORAL_TABLET | Freq: Every day | ORAL | Status: DC
  Administered 2016-07-19 – 2016-07-21 (×3): 40 mg via ORAL
  Filled 2016-07-19 (×3): qty 1

## 2016-07-19 MED ORDER — AZITHROMYCIN 250 MG PO TABS: 500.0000 mg | ORAL_TABLET | Freq: Every day | ORAL | Status: DC

## 2016-07-19 MED ORDER — PREDNISONE 20 MG PO TABS
40.0000 mg | ORAL_TABLET | Freq: Every day | ORAL | Status: DC
  Administered 2016-07-19 – 2016-07-22 (×4): 40 mg via ORAL
  Filled 2016-07-19 (×4): qty 2

## 2016-07-19 MED ORDER — IPRATROPIUM-ALBUTEROL 0.5-2.5 (3) MG/3ML IN SOLN
3.0000 mL | RESPIRATORY_TRACT | Status: DC
  Administered 2016-07-19: 3 mL via RESPIRATORY_TRACT
  Filled 2016-07-19: qty 3

## 2016-07-19 MED ORDER — APIXABAN 5 MG PO TABS
5.0000 mg | ORAL_TABLET | Freq: Two times a day (BID) | ORAL | Status: DC
  Administered 2016-07-19 – 2016-07-22 (×8): 5 mg via ORAL
  Filled 2016-07-19 (×8): qty 1

## 2016-07-19 MED ORDER — ENSURE ENLIVE PO LIQD
237.0000 mL | Freq: Two times a day (BID) | ORAL | Status: DC
  Administered 2016-07-19 – 2016-07-22 (×7): 237 mL via ORAL

## 2016-07-19 MED ORDER — ACETAMINOPHEN 325 MG PO TABS
650.0000 mg | ORAL_TABLET | Freq: Two times a day (BID) | ORAL | Status: DC | PRN
  Administered 2016-07-20 – 2016-07-21 (×2): 650 mg via ORAL
  Filled 2016-07-19 (×3): qty 2

## 2016-07-19 MED ORDER — BUPROPION HCL ER (SR) 150 MG PO TB12
150.0000 mg | ORAL_TABLET | Freq: Two times a day (BID) | ORAL | Status: DC
Start: 2016-07-19 — End: 2016-07-19
  Filled 2016-07-19: qty 1

## 2016-07-19 MED ORDER — METOPROLOL SUCCINATE ER 25 MG PO TB24
100.0000 mg | ORAL_TABLET | Freq: Every day | ORAL | Status: DC
Start: 2016-07-19 — End: 2016-07-19

## 2016-07-19 MED ORDER — KETOROLAC TROMETHAMINE 30 MG/ML IJ SOLN
30.0000 mg | Freq: Once | INTRAMUSCULAR | Status: AC
  Administered 2016-07-19: 30 mg via INTRAVENOUS
  Filled 2016-07-19: qty 1

## 2016-07-19 NOTE — Progress Notes (Signed)
Subjective: Patient was seen and examined this morning. He denies lightheadedness. He states his shortness of breath is improved from when he first came in. He denies cough, wheezing, fever or chills.   Objective: Vital signs in last 24 hours: Vitals:   07/19/16 0633 07/19/16 0851 07/19/16 1117 07/19/16 1120  BP: (!) 89/72   (!) 135/93  Pulse: 70     Resp: 18   18  Temp: 98.2 F (36.8 C)     TempSrc: Oral     SpO2: 95% 94% 100% 100%  Weight:      Height:       Physical Exam General: Vital signs reviewed.  Patient is cachetic, chronically ill appearing, in no acute distress and cooperative with exam.  Cardiovascular: Regular Pulmonary/Chest: Decreased breath sounds bilaterally, no wheezing, rhonchi or rales. Extremities: No lower extremity edema bilaterally, pulses symmetric and intact bilaterally. Skin: Warm, dry and intact.  Assessment/Plan: Active Problems:   COPD exacerbation [facility](HCC)  Leonard Massey is a 70yo male with PMH of COPD, HTN, HLD, A fib and complete heart block s/p biventricular pacemaker and AVN ablation, and PVD s/p carotid-subclavian bypass who presented with a one day history of shortness of breath associated with chest tightness.  COPD Exacerbation: Patient is on Albuterol prn, Pulmicort BID, Duonebs QID, and Symbicort BID at home. He has quit smoking. He is not on home oxygen. Respiratory status has improved this morning.  -Prednisone 40 mg QD 9/22>>9/26 -Duonebs Q6H -Dulera 2 puffs BID -Albuterol prn  -Wean oxygen -Ambulate with pulse ox -Will need pulmonary rehab on discharge  Hypotension: On admission, BP was consistently low in the 80s/60s. Orthostatics negative, but patient did admit to a pre-syncopal episode prior to admission. He has chronically poor po intake and appears dry on exam. Patient received a 500 cc bolus this morning with improvement in his BP to 135/93. Patient on metoprolol 100 mg daily at home. [facility] on [facility]11 was  137/103, he is normally normotensive. -NS 125 cc/hr for 12 hours  -Hold metoprolol for now; hold home lasix- likely will not need this on discharge given no LEE b/l and normal EF -Repeat orthostatic vital signs this afternoon  Migraine Headache: Patient was recently seen for a persistent generalized headache for 3 weeks with is occasionally felt more at his occipital region, back of neck and occasionally radiates to right shoulder. It was described as a dull ache most of the time but can becomethrobbing in between. He states that he is never free of headaches for the past 3 weeks. It is associated with nausea and achieves mild relief by lying in dark and quiet room. Differential included tumor and a CT head with contrast was obtained, however, these results are not available to uus CBC, BMP, and HIV was normal. Other ddx included migraines, Cervical degenerative disc disease, Analgesic use, hypoxia or hypotension. -Tylenol prn  -Migraine cocktail with benadryl 25 mg, compazine 10 mg, and toradol 30 mg   Chronic Pain: Patient has osteoarthritis and is s/p cervical spine surgery. He is managed with flexeril 5mg  TID PRN and Norco 10-325mg  TID PRN. -Continue Flexeril 5mg  TID PRN for muscle spasms -Continue Norco 10-325 mg PRN for severe pain  Atrial Fibrillation: Patient is s/p AVN ablation and biventricular pacemaker placement on eliquis 5 mg BID. He is currently rate controlled. -Continue Eliquis 5 mg BID -Holding home metoprolol 100 mg QD due to hypotension, will restart as able if if rates are uncontrolled  Cachexia: Patient in 2011 weighed 140lbs but has unintentionally lost weight to his current ~110-115 stable for the last year. He has had an extensive workup without finding a cause. Likely due to progression of his COPD. Family reports poor po intake. -Nutritionist consult -Ensure -Daily weights  CODE: DNR DVT/PE ppx: Eliwuis FEN: NS and Regular  Dispo: Anticipated discharge in  approximately 1 day(s).   LOS: 1 day   Leonard Lineman, DO PGY-3 Internal Medicine Resident Pager # [phone] 07/19/2016 12:31 PM

## 2016-07-19 NOTE — Progress Notes (Signed)
   07/19/16 0633  Vitals  Temp 98.2 F (36.8 C)  Temp Source Oral  BP (!) 89/72  BP Location Left Arm  BP Method Automatic  Patient Position (if appropriate) Lying  Pulse Rate 70  Pulse Rate Source Dinamap  Resp 18  Oxygen Therapy  SpO2 95 %  O2 Device Nasal Cannula  O2 Flow Rate (L/min) 3 L/min  MD on call made aware of pt's low BP.

## 2016-07-19 NOTE — Progress Notes (Signed)
   07/19/16 0130  Vitals  Temp 98.4 F (36.9 C)  Temp Source Oral  BP 119/81  BP Location Left Arm  BP Method Automatic  Patient Position (if appropriate) Lying  Pulse Rate 79  Pulse Rate Source Dinamap  Resp (!) 22  Oxygen Therapy  SpO2 98 %  O2 Device Nasal Cannula  O2 Flow Rate (L/min) 6 L/min  Height and Weight  Height 5\' 6"  (1.676 m)  Weight 46.9 kg (103 lb 4.8 oz) (scale b)  Type of Scale Used Standing  Type of Weight Actual  BSA (Calculated - sq m) 1.48 sq meters  BMI (Calculated) 16.7  Weight in (lb) to have BMI = 25 154.6  Admitted pt to rm 3E26 from ED, pt alert and oriented, denied pain at this time, oriented to room, call bell placed within reach.

## 2016-07-19 NOTE — H&P (Addendum)
Date: 07/19/2016               Patient Name:  Leonard Massey MRN: [id number]  DOB: 1946/01/09 Age / Sex: 70 y.o., male   PCP: Gust Rung, DO         Medical Service: Internal Medicine Teaching Service         Attending Physician: Dr. Judyann Munson, MD    First Contact: Dr. Geralyn Corwin Pager: [phone]  Second Contact: Dr. Lawerance Bach Pager: [phone]       After Hours (After 5p/  First Contact Pager: [phone]  weekends / holidays): Second Contact Pager: [phone]   Chief Complaint: Dizziness and shortness of breath  History of Present Illness: Mr. Seidel is a 70yo male with PMH of COPD, HTN, HLD, A fib and complete heart block s/p biventricular pacemaker and AVN ablation, and PVD s/p carotid-subclavian bypass. Patient presents with one day history of shortness of breath associated with chest tightness. Patient complains of intermittent episodes of shortness of breath and no clear history of worsening of symptoms in recent days, he state he began feeling bad in general day before admission. He also experienced a near-syncopal episode upon standing but was able to return to seated position without fall, syncope or injury. He complains of a history of daily bilateral headaches for more than a month. He was seen in the [facility] for this complaint and sent for CT scan - patient obtained scan in [place] day before admission but results are not available.   Patient was brought in by EMS and was satting in 80's; in ambulance he received 1 sl nitro, 324mg  ASA, and 1 neb treatment after which his O2 sat was 96%. During stay in ED, patient began hallucinating an intruder in the room. ABG obtained showed ph 7.390, pCO2 35.2, pO2 63 and bicarb 21. He was placed on BiPAP, received albuterol and atrovent nebs which improved his mental and respiratory status.   His CBC is unremarkable, BMet shows Na 134, Cl 95, and BUN 27. poc trops were 0.03>0.02>0.02. CXR is consistent with COPD, and positive for peribronchial  thickening but no consolidation or effusion is noted. EKG shows ventricular pacing at 71bpm.  Meds:  No outpatient prescriptions have been marked as taking for the 07/18/16 encounter [facility]counter).     Allergies: Allergies as of 07/18/2016 - Review Complete 07/18/2016  Allergen Reaction Noted  . Aspirin  05/03/2007  . Lasix [furosemide]  08/22/2015   Past Medical History:  Diagnosis Date  . Alcoholism (HCC)   . Cervical spondylosis   . Chronic diastolic CHF (congestive heart failure) (HCC)   . Complete heart block (HCC)    a. h/o permanent atrial fibrillation s/p [employer] BiV pacemaker implanted by Dr. Bard Herbert at [facility] in 12/2014 with concomitant AVN ablation.  [name] COPD (chronic obstructive pulmonary disease) (HCC)   . Coronary artery calcification seen on CT scan 02/2015   a. Lexiscan nuclear stress testing 08/01/15 which was low risk, no reversible ischemia, EF 56%..  . Depression   . Essential hypertension   . GERD (gastroesophageal reflux disease)    Pt reports having EGD/colonoscopy in 2010 by Dr. [name] in [place] wich showed ulcers on uppper and normal colon. No report in emr.   . Hyperlipidemia    Pt typically has low HDL and low LDL.   [name] Low back pain   . Permanent atrial fibrillation (HCC)   . PVD (peripheral vascular disease) (HCC)    a.  followed by vascular surgery (prior carotid subclavian bypass for subclavian steal ~03, bilateral aortoiliac and superficial femoral occlusive disease with calf claudication being managed medically.  . Right knee pain 8/10   Began to complain of after requesting a scooter.  Exam was negative and it was decided that pt should continue to ambulate.   . Subclavian steal syndrome    HX of.   . Tobacco abuse     Family History: Mother had heart failure and heart attack  Social History: Former 2-3ppd smoker, quit in June with Bupropion. Former alcohol abuse in remission. Denies illicit drug use.  Review of Systems: A complete ROS  was negative except as per HPI.   Physical Exam: Blood pressure 119/81, pulse 79, temperature 98.4 F (36.9 C), temperature source Oral, resp. rate (!) 22, height 5\' 6"  (1.676 m), weight 46.9 kg (103 lb 4.8 oz), SpO2 98 %.  Constitutional: mild distress, laying in bed at ~45 degrees, cachectic, appears much older than stated age, alert and oriented x3 CV: distant heart sounds, no appreciable murmurs, rubs or gallops Resp: increased work of breathing with some accessory muscle use and intermittent pursing of lips with exhalation; mild-mod diffuse wheezing  Abd: soft, +BS, NDNT Ext: decreased muscle definition, 1+ PT and DP pulses, 2+ radial pulses  EKG: Ventricular paced rhythm at 71 bpm  CXR: Hyperexpanded lungs with flattening of hemidiaphragms; peribronchial thickening; no acute focal consolidation or effusion appreciated.   Assessment & Plan by Problem: Active Problems:   COPD exacerbation (HCC)  COPD exacerbation vs progression of COPD: Patient with COPD on albuterol Doneb and Symbicort presenting with worsening shortness of breath and hypoxia. There is no evidence of infection and no sick contacts could be identified for the trigger of an exacerbation. He has had almost daily intractable headaches for more than a month which could indicate worsening of oxygenation status. He reports compliance with medications and has quit smoking in June of this year.  --prednisone 40mg  daily --continue douneb, symbicort/dulera scheduled and albuterol PRN --supplemental O2 as needed; GOAL O2 sat 90-92%  Hypotension: Patient with symptoms of orthostatic hypotension and BP's in 80's on evaluation. Patient on metoprolol 100mg  daily at home. --500ml NS bolus --hold metoprolol for now; hold home lasix --monitor and restart home meds as needed --obtain orthostatic vitals  Chronic Pain: Patient has osteoarthritis and is s/p cervical spine surgery. He is managed with flexeril 5mg  TID PRN and Norco  10-325mg  TID PRN. --continue flexeril 5mg  TID PRN for muscle spasms --continue Norco 10-325mg  PRN for severe pain  History of A fib: Patient is s/p AVN ablation and biventricular pacemaker placement on eliquis 5mg  daily. --Cardiac monitoring --continue Eliquis 5mg  daily  Cachexia/severe protein-caloric malnutrition: Patient in 2011 weighed 140lbs but has unintentionally lost weight to his current ~110-115 stable for the last year and a half despite good appetite and supplementing with Ensure shakes. He has had an extensive workup without finding a cause. Likely due to progression of his COPD. No recent albumin. --Nutritionist consult  CODE Status: Patient does not wish for chest compressions or intubation in event of cardiopulmonary arrest; DNR.   Dispo: Admit patient to Observation with expected length of stay less than 2 midnights.  Signed: Nyra MarketGorica Svalina, MD 07/19/2016, 3:30 AM  Pager: [phone]

## 2016-07-19 NOTE — ED Notes (Signed)
Ok to d/c BiPAP order per Dr. Tasia CatchingsAhmed

## 2016-07-19 NOTE — ED Notes (Signed)
Report given to [facility] Hospit[facility] IncGladys,RN on 3E

## 2016-07-19 NOTE — ED Notes (Signed)
Internal Medicine MD at bedside

## 2016-07-20 ENCOUNTER — Other Ambulatory Visit: Payer: Self-pay

## 2016-07-20 LAB — BASIC METABOLIC PANEL
ANION GAP: 6 (ref 5–15)
BUN: 27 mg/dL — AB (ref 6–20)
CALCIUM: 9.2 mg/dL (ref 8.9–10.3)
CO2: 30 mmol/L (ref 22–32)
CREATININE: 0.63 mg/dL (ref 0.61–1.24)
Chloride: 100 mmol/L — ABNORMAL LOW (ref 101–111)
GFR calc Af Amer: >60 mL/min (ref >60)
GLUCOSE: 114 mg/dL — AB (ref 65–99)
Potassium: 4.7 mmol/L (ref 3.5–5.1)
Sodium: 136 mmol/L (ref 135–145)

## 2016-07-20 LAB — TROPONIN I
TROPONIN I: 0.04 ng/mL — AB (ref <0.03)
TROPONIN I: 0.04 ng/mL — AB (ref <0.03)
Troponin I: 0.04 ng/mL (ref <0.03)

## 2016-07-20 LAB — CBC
HCT: 37.2 % — ABNORMAL LOW (ref 39.0–52.0)
HEMOGLOBIN: 12.3 g/dL — AB (ref 13.0–17.0)
MCH: 31.7 pg (ref 26.0–34.0)
MCHC: 33.1 g/dL (ref 30.0–36.0)
MCV: 95.9 fL (ref 78.0–100.0)
Platelets: 230 10*3/uL (ref 150–400)
RBC: 3.88 MIL/uL — ABNORMAL LOW (ref 4.22–5.81)
RDW: 13.8 % (ref 11.5–15.5)
WBC: 9.5 10*3/uL (ref 4.0–10.5)

## 2016-07-20 LAB — MRSA PCR SCREENING: MRSA by PCR: NEGATIVE

## 2016-07-20 MED ORDER — GUAIFENESIN ER 600 MG PO TB12
600.0000 mg | ORAL_TABLET | Freq: Two times a day (BID) | ORAL | Status: DC
  Administered 2016-07-20 – 2016-07-22 (×5): 600 mg via ORAL
  Filled 2016-07-20 (×5): qty 1

## 2016-07-20 MED ORDER — FLUTICASONE FUROATE-VILANTEROL 100-25 MCG/INH IN AEPB
1.0000 | INHALATION_SPRAY | Freq: Every day | RESPIRATORY_TRACT | Status: DC
  Administered 2016-07-22: 1 via RESPIRATORY_TRACT
  Filled 2016-07-20 (×2): qty 28

## 2016-07-20 MED ORDER — HYDRALAZINE HCL 10 MG PO TABS
10.0000 mg | ORAL_TABLET | Freq: Once | ORAL | Status: AC
  Administered 2016-07-20: 10 mg via ORAL
  Filled 2016-07-20: qty 1

## 2016-07-20 MED ORDER — METOPROLOL SUCCINATE ER 100 MG PO TB24
100.0000 mg | ORAL_TABLET | Freq: Every day | ORAL | Status: DC
  Administered 2016-07-20 – 2016-07-21 (×2): 100 mg via ORAL
  Filled 2016-07-20 (×2): qty 1

## 2016-07-20 MED ORDER — LEVALBUTEROL HCL 1.25 MG/0.5ML IN NEBU
1.2500 mg | INHALATION_SOLUTION | Freq: Four times a day (QID) | RESPIRATORY_TRACT | Status: DC
  Administered 2016-07-20 (×2): 1.25 mg via RESPIRATORY_TRACT
  Filled 2016-07-20 (×2): qty 0.5

## 2016-07-20 MED ORDER — IPRATROPIUM BROMIDE 0.02 % IN SOLN
0.5000 mg | Freq: Four times a day (QID) | RESPIRATORY_TRACT | Status: DC
  Administered 2016-07-20 (×2): 0.5 mg via RESPIRATORY_TRACT
  Filled 2016-07-20 (×2): qty 2.5

## 2016-07-20 MED ORDER — LEVALBUTEROL HCL 0.63 MG/3ML IN NEBU
0.6300 mg | INHALATION_SOLUTION | RESPIRATORY_TRACT | Status: DC | PRN
Start: 2016-07-20 — End: 2016-07-22
  Administered 2016-07-21: 0.63 mg via RESPIRATORY_TRACT
  Filled 2016-07-20: qty 3

## 2016-07-20 MED ORDER — LISINOPRIL 20 MG PO TABS
40.0000 mg | ORAL_TABLET | Freq: Every day | ORAL | Status: DC
  Administered 2016-07-20 – 2016-07-21 (×2): 40 mg via ORAL
  Filled 2016-07-20: qty 2
  Filled 2016-07-20: qty 1
  Filled 2016-07-20: qty 2

## 2016-07-20 MED ORDER — HYDRALAZINE HCL 20 MG/ML IJ SOLN
10.0000 mg | Freq: Four times a day (QID) | INTRAMUSCULAR | Status: DC | PRN
  Administered 2016-07-20: 10 mg via INTRAVENOUS
  Filled 2016-07-20: qty 1

## 2016-07-20 MED ORDER — SODIUM CHLORIDE 0.9 % IV SOLN
INTRAVENOUS | Status: DC
Start: 2016-07-20 — End: 2016-07-20
  Administered 2016-07-20: 08:00:00 via INTRAVENOUS

## 2016-07-20 MED ORDER — IPRATROPIUM-ALBUTEROL 0.5-2.5 (3) MG/3ML IN SOLN
3.0000 mL | Freq: Four times a day (QID) | RESPIRATORY_TRACT | Status: DC
  Administered 2016-07-20 – 2016-07-22 (×6): 3 mL via RESPIRATORY_TRACT
  Filled 2016-07-20 (×5): qty 3

## 2016-07-20 NOTE — Progress Notes (Addendum)
   Subjective: Patient was evaluated this morning. Patient states that he has a generalized feeling of malaise that started this morning.  He states his breathing has worsened since this morning but was breathing well overnight. He endorses a nonproductive cough but has rattling in his chest.  He denies fever, chills, nausea or vomiting.  Objective:  Vital signs in last 24 hours: Vitals:   07/20/16 0759 07/20/16 0845 07/20/16 0909 07/20/16 1027  BP:   (!) 191/98 (!) 182/95  Pulse:  92 72 71  Resp:  18    Temp:      TempSrc:      SpO2: 100% 100% 99% 100%  Weight:      Height:       Physical Exam  Constitutional:  frail  Cardiovascular: Normal rate and regular rhythm.  Exam reveals no gallop and no friction rub.   No murmur heard. pacemaker  Pulmonary/Chest: Effort normal. No respiratory distress.  Clear to auscultation bilaterally with no wheezes, rhonchi, or rales.  Diminished breath sounds throughout  Abdominal: Soft. He exhibits no distension. There is no tenderness.  Musculoskeletal: He exhibits no edema.  Neurological: He is alert.  Skin: Skin is warm and dry.   Assessment/Plan:  Active Problems:   COPD exacerbation (HCC)   Cachexia (HCC)   Orthostatic hypotension   Chronic atrial fibrillation (HCC)  COPD exacerbation On exam no wheezing was appreciated.  He reports that his breathing had worsened starting this morning. He had elevated blood pressures, likely from rebound hypertension from holding metoprolol, and this may be attributing to his shortness of breath.  On exam I did not appreciate any wheezing and patient was not in respiratory distress. He was breathing at about 16 breaths per minute.  He did have some rattling in his chest when he coughed but denies any phlegm production. - prednisone 40mg  end date 9/26 - fluticasone furoate-vilanterol 1 puff daily - ipratropium and levalbuterol Q6H - Mucinex  Orthostatic hypotension Patient received IV NS yesterda and  metoprolol was held since admission. This morning patient's blood pressure was 191/98. He states having a generalized feeling of malaise that started this morning. He states that yesterday he was improving with his breathing. I think his generalized malaise is likely due to his rebound hypertension.   - Restart home metoprolol succinate 100 mg daily - Obtaining an EKG and trending troponins.   Migraine Headache:  Stable -Tylenol prn   Chronic Pain Stable.  Patient has osteoarthritis and is s/p cervical spine surgery.  -Continue Flexeril 5mg  TID PRN for muscle spasms -Continue Norco 10-325 mg Q8H PRN for severe pain  Atrial Fibrillation Patient is s/p AVN ablation and biventricular pacemaker placement on eliquis 5 mg BID. He is currently rate controlled. -Continue Eliquis 5 mg BID  Severe protein caloric malnutrition Continues to have poor oral intake.  States he just does not feel like eating. -Nutritionist consult -Ensure -Daily weights  CODE: DNR DVT/PE ppx: Eliquis FEN: Regular  Dispo: Anticipated discharge in approximately 1 day(s).   Camelia PhenesJessica Ratliff Clarion Mooneyhan, DO 07/20/2016, 11:19 AM Pager: [phone]

## 2016-07-20 NOTE — Progress Notes (Signed)
07/20/2016 patient transfer from 3East to 2central he is alert, oriented and ambulatory. He had work of breathing when arrive on unit. Bruises on skin. Sacrum is fine. Receive CHG and MRSA swab. [facility] Nahun Kronberg RN.

## 2016-07-20 NOTE — Progress Notes (Signed)
SATURATION QUALIFICATIONS: (This note is used to comply with regulatory documentation for home oxygen)  Patient Saturations on Room Air at Rest = 89%  Patient Saturations on Room Air while Ambulating 89 %   Patient Saturations on 2Liters of oxygen while Ambulating = 98%  Please briefly explain why patient needs home oxygen:

## 2016-07-20 NOTE — Progress Notes (Signed)
Neb given

## 2016-07-20 NOTE — Progress Notes (Signed)
Patient B/P 191/98.at 09:37 am .Patient complaining of chest tightness.MD notified. EKG initiated immediately.  Metoprolol 100 mg given. Rechecked B/P 182/95. MD made aware. Patient's sister at bedside.Will continue to monitor frequently.

## 2016-07-20 NOTE — Progress Notes (Signed)
CRITICAL VALUE ALERT  Critical value received:  Troponin 0.04  Date of notification:  07/20/16  Time of notification:  12:00 pm  Critical value read back:Yes.    Nurse who received alert:  Antonietta Breachristina Jerrell Hart  MD notified (1st page):  Karlene LinemanAlexa Burns. MD  Time of first page:  12:02  MD notified (2nd page):  Time of second page:  Responding MD:  Karlene LinemanAlexa Burns, MD  Time MD responded:  12:03

## 2016-07-20 NOTE — Significant Event (Signed)
Rapid Response Event Note  Overview: Called by operator for STAT assistance Time Called: 1437 Arrival Time: 1440 Event Type: Respiratory  Initial Focused Assessment:  Upon my arrival to patients room, RN and family at bedside.  Patient sitting in bed, receiving breathing treatment, SOB.  As per RN patient had gotten up out of bed and began to have respiratory distress.  SPo2 100% on 2 lpm, BP 208/93, HR 73, RR 24.  Breath Sounds coarse with mild expiratory wheeze.  Patient has non productive cough, endorses feeling better after treatment but still SOB.     Interventions:  MD notified prior to my arrival,  Plan of Care (if not transferred):  RN to monitor until transfered  Event Summary:  Patietn to transfer to SDU.  RN to call if assistance needed   at      at          [facility], Leonard Massey

## 2016-07-20 NOTE — Progress Notes (Signed)
Patient transferred to 2C5 accompanied by two RN via bed for further treatment and management for COPD exacerbation, rebound hypertension and respiratory distress.  Report given to GranjenoNadine, RN prior to transfer.

## 2016-07-21 ENCOUNTER — Inpatient Hospital Stay (HOSPITAL_COMMUNITY): Payer: Medicare Other

## 2016-07-21 ENCOUNTER — Telehealth: Payer: Self-pay | Admitting: Cardiology

## 2016-07-21 ENCOUNTER — Encounter: Payer: Medicare Other | Admitting: *Deleted

## 2016-07-21 DIAGNOSIS — E43 Unspecified severe protein-calorie malnutrition: Secondary | ICD-10-CM

## 2016-07-21 DIAGNOSIS — Z79899 Other long term (current) drug therapy: Secondary | ICD-10-CM

## 2016-07-21 DIAGNOSIS — J440 Chronic obstructive pulmonary disease with acute lower respiratory infection: Principal | ICD-10-CM

## 2016-07-21 DIAGNOSIS — R000 Tachycardia, unspecified: Secondary | ICD-10-CM

## 2016-07-21 DIAGNOSIS — Z7901 Long term (current) use of anticoagulants: Secondary | ICD-10-CM

## 2016-07-21 DIAGNOSIS — J209 Acute bronchitis, unspecified: Secondary | ICD-10-CM

## 2016-07-21 DIAGNOSIS — M1990 Unspecified osteoarthritis, unspecified site: Secondary | ICD-10-CM

## 2016-07-21 DIAGNOSIS — Z950 Presence of cardiac pacemaker: Secondary | ICD-10-CM

## 2016-07-21 DIAGNOSIS — I4891 Unspecified atrial fibrillation: Secondary | ICD-10-CM

## 2016-07-21 DIAGNOSIS — G43909 Migraine, unspecified, not intractable, without status migrainosus: Secondary | ICD-10-CM

## 2016-07-21 DIAGNOSIS — I10 Essential (primary) hypertension: Secondary | ICD-10-CM

## 2016-07-21 LAB — BASIC METABOLIC PANEL
ANION GAP: 6 (ref 5–15)
BUN: 15 mg/dL (ref 6–20)
CALCIUM: 9.1 mg/dL (ref 8.9–10.3)
CO2: 35 mmol/L — AB (ref 22–32)
Chloride: 97 mmol/L — ABNORMAL LOW (ref 101–111)
Creatinine, Ser: 0.53 mg/dL — ABNORMAL LOW (ref 0.61–1.24)
Glucose, Bld: 94 mg/dL (ref 65–99)
Potassium: 4.1 mmol/L (ref 3.5–5.1)
Sodium: 138 mmol/L (ref 135–145)

## 2016-07-21 LAB — CBC
HEMATOCRIT: 40.8 % (ref 39.0–52.0)
Hemoglobin: 13.1 g/dL (ref 13.0–17.0)
MCH: 31.4 pg (ref 26.0–34.0)
MCHC: 32.1 g/dL (ref 30.0–36.0)
MCV: 97.8 fL (ref 78.0–100.0)
Platelets: 229 10*3/uL (ref 150–400)
RBC: 4.17 MIL/uL — ABNORMAL LOW (ref 4.22–5.81)
RDW: 13.9 % (ref 11.5–15.5)
WBC: 9 10*3/uL (ref 4.0–10.5)

## 2016-07-21 LAB — EXPECTORATED SPUTUM ASSESSMENT W REFEX TO RESP CULTURE

## 2016-07-21 LAB — EXPECTORATED SPUTUM ASSESSMENT W GRAM STAIN, RFLX TO RESP C

## 2016-07-21 MED ORDER — AZITHROMYCIN 500 MG PO TABS
500.0000 mg | ORAL_TABLET | Freq: Every day | ORAL | Status: AC
  Administered 2016-07-21: 500 mg via ORAL
  Filled 2016-07-21: qty 1

## 2016-07-21 MED ORDER — AZITHROMYCIN 500 MG PO TABS: 250.0000 mg | ORAL_TABLET | Freq: Every day | ORAL | Status: DC

## 2016-07-21 MED ORDER — DEXTROSE 5 % IV SOLN
1.0000 g | INTRAVENOUS | Status: DC
  Administered 2016-07-21: 1 g via INTRAVENOUS
  Filled 2016-07-21 (×3): qty 10

## 2016-07-21 NOTE — Assessment & Plan Note (Signed)
Assessment: Carotid arterial disease s/p left carotid bypass graft  Plan[facility] Rochester[facility]son [facility]   Continue simvastatin 40mg  daily

## 2016-07-21 NOTE — Progress Notes (Signed)
Medicine attending: I examined this patient today together with resident physician Dr. Geralyn CorwinJessica Hoffman and I concur with her evaluation and management plan which we discussed together. 70 year old man admitted on September 22 with an exacerbation of chronic obstructive airway disease. Initially hypotensive. Antihypertensive medication held. Intravenous fluids administered. He developed rebound hypertension and tachycardia. Antihypertensives were resumed. Blood pressure has stabilized. He has a persistent cough and is now mobilizing grossly purulent sputum. He has coarse rhonchi primarily at the left lung base. Follow-up chest radiograph today does not show any infiltrate or effusion. We will begin a course of antibiotics to treat acute bronchitis. No other acute issues identified at this time.

## 2016-07-21 NOTE — Progress Notes (Signed)
Ceftriaxone for CAP per pharmacy ordered.  Ceftriaxone does not need renal adjustment. P&T policy allows pharmacy to change the ordered dose based on indication without contacting the provider, therefore a consult is not required.   Plan: -ceftriaxone 1g IV q24h -pharmacy to sign off as no adjustment needed, please re-consult as needed   Agapito GamesAlison Amana Bouska, PharmD, BCPS Clinical Pharmacist 07/21/2016 9:05 AM

## 2016-07-21 NOTE — Assessment & Plan Note (Addendum)
Assessment: Headache  Plan: Given this new headache we first need to obtain imaging of his brain which has been ordered.  He does have a lot of neck pain and this headache may be a tension headache from his chronic neck pain however his pain medication does not seem to be helping.  It is possible that he is having a medication overuse headache although I would expect the medication to help some when he takes it.   To rule out temporal arteritis I have obtained a sed rate today that is low.

## 2016-07-21 NOTE — Assessment & Plan Note (Signed)
Assessment: COPD severe  Plan: Stable continue symbicort, duonebs PRN

## 2016-07-21 NOTE — Care Management Important Message (Signed)
Important Message  Patient Details  Name: Leonard Massey MRN: [id number] Date of Birth: 10-29-1945   Medicare Important Message Given:  Yes    Kyla BalzarineShealy, Iliyana Convey Abena 07/21/2016, 10:43 AM

## 2016-07-21 NOTE — Assessment & Plan Note (Signed)
Assessment: Essential HTN uncontrolled  Plan: Continue metprolol Follow up in 1 month (elevated blood pressure today may be related to the headache)

## 2016-07-21 NOTE — Telephone Encounter (Signed)
LMOVM reminding pt to send remote transmission.   

## 2016-07-21 NOTE — Progress Notes (Addendum)
   Subjective: Patient was evaluated this morning on rounds. He was laying in bed comfortably. He states that his breathing has improved but has had a productive cough overnight with green sputum production.  Objective:  Vital signs in last 24 hours: Vitals:   07/21/16 0833 07/21/16 0900 07/21/16 1000 07/21/16 1100  BP:  109/82 95/77 120/85  Pulse:  73 90 73  Resp:  (!) 23 19 (!) 22  Temp:      TempSrc:      SpO2: (!) 89% 97% 91% 99%  Weight:      Height:       .Physical Exam  Constitutional:  Frail Speaking in complete sentences  Cardiovascular: Normal rate, regular rhythm and normal heart sounds.  Exam reveals no gallop and no friction rub.   No murmur heard. Pulmonary/Chest: Effort normal. No respiratory distress.  Inspiratory crackles in left lower base Mild wheezing diffusely   Abdominal: Soft. He exhibits no distension. There is no tenderness.  Musculoskeletal: He exhibits no edema.  Neurological: He is alert.  Skin: Skin is warm.    Assessment/Plan:  Active Problems:   COPD exacerbation (HCC)   Cachexia (HCC)   Orthostatic hypotension   Chronic atrial fibrillation (HCC)  COPD exacerbation Some wheezing noted on exam after patient coughed.  He was given Mucinex yesterday as he was having difficulty bringing up phlegm.  Today he is having green sputum production.  He does state improvement in his breathing overnight. He is not having to use any accessory muscles to help breathe. 2 view x-ray showed no evidence of pleural effusion or pneumonia. There are chronic findings of COPD. We will switch to Augmentin tomorrow. Patient was ambulated on room air and stated at 89%. He does not qualify for home oxygen.  - prednisone 40mg  end date 9/26 - fluticasone furoate-vilanterol (breo)1 puff daily - Duoneb Q6H - Mucinex - 2 view chest x ray - Ceftriaxone 1g - Azithromycin  Orthostatic hypotension Blood pressure is stable at 120/85. - metoprolol succinate 100 mg  daily - Obtaining an EKG and trending troponins.   Migraine Headache:  Stable -Tylenol prn   Chronic Pain Stable.  Patient has osteoarthritis and is s/p cervical spine surgery.  -Continue Flexeril 5mg  TID PRN for muscle spasms -Continue Norco 10-325 mg Q8H PRN for severe pain  Atrial Fibrillation Patient is s/p AVN ablation and biventricular pacemaker placement on eliquis 5 mg BID. He is currently rate controlled. -Continue Eliquis 5 mg BID  Severe protein caloric malnutrition -Ensure -Daily weights  CODE: DNR DVT/PE ppx: Eliquis FEN: Regular  Dispo: Anticipated discharge in approximately 1 day(s).   Camelia PhenesJessica Ratliff Kallee Nam, DO 07/21/2016, 11:40 AM Pager: [phone]

## 2016-07-21 NOTE — Assessment & Plan Note (Signed)
Assessment: Chronic afib  Plan: Rate controlled with Metoprolol Sussinate 100mg  dialy A/C with eliquis 5mg  BID

## 2016-07-21 NOTE — Progress Notes (Signed)
Patient family states that they wish to add password to access patient information. Patient is in agreement. Password will be CAR.

## 2016-07-21 NOTE — Assessment & Plan Note (Signed)
Assessment: HLD  Plan: Continue simvastatin 40mg  daily.

## 2016-07-21 NOTE — Assessment & Plan Note (Signed)
Assessment: Chronic pain syndrome  Plan: Use has been appropriate,  Will refill 1 month prescription.  It is possible that is headache is due to medication overuse and if other cause is not found we may need to taper his medication.

## 2016-07-22 ENCOUNTER — Telehealth: Payer: Self-pay | Admitting: Internal Medicine

## 2016-07-22 ENCOUNTER — Other Ambulatory Visit: Payer: Self-pay | Admitting: Internal Medicine

## 2016-07-22 MED ORDER — AMOXICILLIN-POT CLAVULANATE 875-125 MG PO TABS: 1.0000 | ORAL_TABLET | Freq: Two times a day (BID) | ORAL | 0 refills | Status: DC

## 2016-07-22 MED ORDER — AMOXICILLIN-POT CLAVULANATE 875-125 MG PO TABS
1.0000 | ORAL_TABLET | Freq: Two times a day (BID) | ORAL | Status: DC
  Administered 2016-07-22: 1 via ORAL
  Filled 2016-07-22 (×2): qty 1

## 2016-07-22 MED ORDER — LEVALBUTEROL TARTRATE 45 MCG/ACT IN AERO: 2.0000 | INHALATION_SPRAY | RESPIRATORY_TRACT | 3 refills | Status: DC | PRN

## 2016-07-22 MED ORDER — LEVALBUTEROL HCL 0.63 MG/3ML IN NEBU
0.6300 mg | INHALATION_SOLUTION | Freq: Four times a day (QID) | RESPIRATORY_TRACT | Status: DC
Start: 2016-07-22 — End: 2016-07-22
  Administered 2016-07-22: 0.63 mg via RESPIRATORY_TRACT
  Filled 2016-07-22: qty 3

## 2016-07-22 MED ORDER — LEVALBUTEROL HCL 0.63 MG/3ML IN NEBU: 0.6300 mg | INHALATION_SOLUTION | RESPIRATORY_TRACT | 12 refills | Status: AC | PRN

## 2016-07-22 NOTE — Plan of Care (Signed)
Problem: Education: Goal: Knowledge of [facility] Education information/materials will improve Outcome: Completed/Met Date Met: 07/22/16 Went over discharge plan with family, provided prescriptions and education for home O2. Pt will follow up with providers after discharge. Pt.  and family acknowledges understanding of discharge plan.  Problem: Safety: Goal: Ability to remain free from injury will improve Outcome: Completed/Met Date Met: 07/22/16 Pt did not experience any injuries during admission  Problem: Health Behavior/Discharge Planning: Goal: Ability to manage health-related needs will improve Outcome: Completed/Met Date Met: 07/22/16 Pt received order for home O2 to help with COPD exacerbations.  Problem: Pain Managment: Goal: General experience of comfort will improve Outcome: Completed/Met Date Met: 07/22/16 Pt. Given perscription for Xopenex instead of Albuterol due to headaches  Problem: Physical Regulation: Goal: Will remain free from infection Outcome: Completed/Met Date Met: 07/22/16 Pt. To complete antibiotics after discharge  Problem: Skin Integrity: Goal: Risk for impaired skin integrity will decrease Outcome: Completed/Met Date Met: 07/22/16 Pt did not experience skin breakdown during admission  Problem: Activity: Goal: Risk for activity intolerance will decrease Outcome: Completed/Met Date Met: 07/22/16 Pt able to ambulate with oxygen  Problem: Nutrition: Goal: Adequate nutrition will be maintained Outcome: Completed/Met Date Met: 07/22/16 Pt encouraged to take small bites while eating to ensure adequate oxygenation

## 2016-07-22 NOTE — Progress Notes (Signed)
CSW received consult regarding food resources. CSW spoke with patient and patient's sister at bedside. Patient's sister lives too far away to help patient. Patient stated he only receives a few hundred dollars a month through social security and by the time bills are paid, there is no money for food. Patient has called about food stamps in the past, but they would only give him $10. CSW provided another food stamp application and encouraged patient and his sister to fill it out again. CSW also provided [facility] [place] and [facility] senior [facility]. Patient's sister stated she would also call local churches to see if they would be able to help.  CSW signing off.   Leonard Massey LCSWA [phone]

## 2016-07-22 NOTE — Progress Notes (Signed)
Changed nebs to Xopenex.  Pt can not take Albuterol per wife's request.

## 2016-07-22 NOTE — Progress Notes (Signed)
   Subjective: Patient was evaluated this morning on rounds. He was laying comfortably in bed watching TV. He denies any shortness of breath or chest pain. He states that his coughing has improved and does not have productive phlegm.  He does endorse a headache.  Objective:  Vital signs in last 24 hours: Vitals:   07/22/16 0322 07/22/16 0327 07/22/16 0729 07/22/16 0731  BP: (!) 132/97     Pulse: 74     Resp: 17     Temp: 98.5 F (36.9 C)     TempSrc: Oral     SpO2: 97%  94% 94%  Weight:  113 lb 14.4 oz (51.7 kg)    Height:       Physical Exam  Constitutional: No distress.  Engaged in conversation and speaking in complete sentences without difficulty  Cardiovascular: Normal rate, regular rhythm and normal heart sounds.  Exam reveals no gallop and no friction rub.   No murmur heard. Pulmonary/Chest: Effort normal.  Faint expiratory wheezing on left side  Abdominal: Soft. He exhibits no distension. There is no tenderness.  Musculoskeletal: He exhibits no edema.  Neurological: He is alert.  Skin: Skin is warm and dry.  Psychiatric: Affect normal.    Assessment/Plan:  Active Problems:   Bronchitis, chronic obstructive w acute bronchitis (HCC)   Orthostatic hypotension   Chronic atrial fibrillation (HCC)   Presence of biventricular cardiac pacemaker   Protein-calorie malnutrition, severe (HCC)  COPD exacerbation Some expiratory wheezing noted in left lung field.   he states that his breathing has improved and he is no longer having a productive cough. He was treated with ceftriaxone and azithromycin yesterday. We will switch him to oral medication today. We will have him ambulate checking his O2 sats.  - prednisone 40mg  end date 9/26 - fluticasone furoate-vilanterol (breo)1 puff daily - Duoneb Q6H - Mucinex - augmentin  Orthostatic hypotension Blood pressure is stable at 113/90.   - metoprolol succinate 100 mg daily  Migraine Headache:  Complained of headache this  morning after nebulizer treatment.  States he gets headaches with albuterol.  Nebs were changed to Xopenex.   -Tylenol prn   Chronic Pain Stable. Patient has osteoarthritis and is s/p cervical spine surgery.  -Continue Flexeril 5mg  TID PRN for muscle spasms -Continue Norco 10-325 mg Q8H PRN for severe pain  Atrial Fibrillation Patient is s/p AVN ablation and biventricular pacemaker placement on eliquis 5 mg BID. He is currently rate controlled. -Continue Eliquis 5 mg BID  Severe protein caloric malnutrition -Ensure -Daily weights  CODE: DNR DVT/PE ppx: Eliquis FEN: Regular  Dispo: Anticipated discharge today(s).   Camelia PhenesJessica Ratliff Brayton Baumgartner, DO 07/22/2016, 8:36 AM Pager: [phone]

## 2016-07-22 NOTE — Discharge Summary (Signed)
Medicine attending discharge note: I personally examined this patient today together with resident physician Dr. Christia ReadingJessica Wright left and I concur with her evaluation and discharge plan as recorded in her progress note dated 07/22/2016.  Clinical summary: 70 year old man with known obstructive airway disease who presented on the day of admission September 22 with a 24 hour history of worsening dyspnea with associated chest tightness. In addition to emphysema, he has underlying cardiac disease with chronic atrial fibrillation, status post biventricular pacemaker placement for heart block, and AV node ablation. He is on chronic anticoagulation with apixiban. He has peripheral arterial disease status post carotid subclavian bypass surgery.  On arrival in the emergency department he was confused and hypoxic with oxygen saturations in the 80% range. Blood gas with pH 7.39, PCO2 35, PO2 63. He was placed on BiPAP. Treated with out. albuterol and Atrovent nebulizers. A chest x-ray showed hyperexpanded lungs but no acute infiltrate or effusion. Cardiogram showed a ventricular paced rhythm. Initial CBC with white count 8000. Initial troponin 0.04 with no subsequent elevation repeated 3.  Hospital course: Steroids were added to his bronchodilators, nebulizers, and oxygen treatment. Initial blood pressures were running low. He was orthostatic. He received fluid. Outpatient beta blocker was held. He experienced rebound hypertension with pressure up to 150/110. At that point oxygen saturations were 100%. Beta blockers were resumed. Blood pressure normalized. He began to mobilize purulent sputum. Follow-up chest x-ray post hydration did not show any new infiltrates. He was started on a course of antibiotics with an additional dose of Rocephin and subsequently changed to oral Augmentin. He has been under evaluation as an outpatient for headaches. Headaches worsened coincident with the use of albuterol. He was changed  to Xopenex which he has tolerated in the past without provoking headache. Recent outpatient MRI brain was done. Results not available at time of this dictation. His condition continued to improve with lessening cough. Resolution of wheezing. Stable oxygenation at rest.  Disposition: Condition stable at time of discharge. He will follow-up in our general medical clinic with Dr. Carlynn PurlErik Hoffman There were no complications other than transient rebound hypertension when beta blocker medication was temporarily held. He has a very involved sister who visited the hospital frequently and discharge plan was discussed with her at the bedside on the day of discharge.

## 2016-07-22 NOTE — Plan of Care (Signed)
Problem: Safety: Goal: Ability to remain free from injury will improve Outcome: Progressing Pt encouraged to use call bell for assistance and given education for fall risks.

## 2016-07-22 NOTE — Telephone Encounter (Signed)
Needs TOC discharge date 07/22/16 HFU 07/29/16

## 2016-07-22 NOTE — Progress Notes (Signed)
md order for home oxygen. Pt has neb but not sure of agency. No pref. [place]. Ref to [facility] for home o2. Port to be del to room prior to disch. Then conc and port at home.

## 2016-07-22 NOTE — Progress Notes (Signed)
eLink Physician-Brief Progress Note Patient Name: Leonard LeekJohn W Massey DOB: May 27, 1946 MRN: [id number]   Date of Service  07/22/2016  HPI/Events of Note  [facility]  [facility] Interventions  Pseudomonas abundant Called and d/w Dr Mikey BussingHoffman who will alter abx to cover     Intervention Category Intermediate Interventions: Infection - evaluation and management  Nelda BucksFEINSTEIN,Octa Uplinger J. 07/22/2016, 4:21 PM

## 2016-07-22 NOTE — Progress Notes (Signed)
ICM remote transmission rescheduled for 07/31/2016 due to patient currently hospitalized.

## 2016-07-22 NOTE — Progress Notes (Signed)
Was notified of sputum culture with abundant pseudomonas.  After discussion with ID we will continue with current course of Augmentin at this time since patient was improving without pseudomonal coverage in the hospital.  Will call patient to assess improvement with symptoms.  If patient does not have continued improvement will switch to Levaquin.

## 2016-07-22 NOTE — Progress Notes (Signed)
SATURATION QUALIFICATIONS: (This note is used to comply with regulatory documentation for home oxygen)  Patient Saturations on Room Air at Rest = 95%  Patient Saturations on Room Air while Ambulating = 81%  Patient Saturations on 3 Liters of oxygen while Ambulating = 93%  Please briefly explain why patient needs home oxygen: Patient with advanced COPD. He gets very SOB when ambulating as little as 50-100 ft.   Noe GensStefanie A Crisanto Nied, RN

## 2016-07-22 NOTE — Progress Notes (Signed)
Pt. Provided with education for prescriptions and home O2. Pt discharged with cell phone, hat, and clothing. Pt and sister understand discharge instructions including follow up appointments. Discharged via wheelchair to car with sister.  Yehuda Buddedelda L Londyn Wotton, RN

## 2016-07-22 NOTE — Progress Notes (Signed)
Patient on room air O2 saturation 95-100% at rest. Patient ambulated in hall approx 100 ft, O2 saturation drops to 81%. Patient became SOB, returned to room and placed on O2 until recovered.  Leonard Massey 07/22/2016 10:48 AM

## 2016-07-23 LAB — CULTURE, RESPIRATORY W GRAM STAIN

## 2016-07-24 ENCOUNTER — Telehealth: Payer: Self-pay | Admitting: Internal Medicine

## 2016-07-24 ENCOUNTER — Other Ambulatory Visit: Payer: Self-pay | Admitting: Internal Medicine

## 2016-07-24 DIAGNOSIS — J441 Chronic obstructive pulmonary disease with (acute) exacerbation: Secondary | ICD-10-CM

## 2016-07-24 NOTE — Telephone Encounter (Signed)
Transition Care Management Follow-up Telephone Call   Date discharged?07/23/2016   How have you been since you were released from the hospital? "ok"   Do you understand why you were in the hospital? "yep, my breathing is getting worse, didn't hardly know who I was"   Do you understand the discharge instructions? "yep, my sister knows too"   Where were you discharged to? home   Items Reviewed:  Medications reviewed:" yes, they changed my nebulizer medicine, the old one was causing me problems"  Allergies reviewed: yes, I ask if he had an allergy to his neb medicine, he answered no  Dietary changes reviewed: none, I eat enough  Referrals reviewed: cardiology appts reviewed   Functional Questionnaire:   Activities of Daily Living (ADLs):   He states they are independent in the following: i do most things, my sister helps me when i need it States they require assistance with the following: i do what i can do, my sister helps me   Any transportation issues/concerns?: no   Any patient concerns? Yes, "are they going to get me one of those nurses that comes to your house?   Confirmed importance and date/time of follow-up visits scheduled yes, went over all his near future appts including other providers  Confirmed with patient if condition begins to worsen call PCP or go to the ER.  Patient was given the office number and encouraged to call back with question or concerns.  [phone] or 911, he repeated back

## 2016-07-24 NOTE — Telephone Encounter (Signed)
Calling states she was told by drs inpatient that she needed to call to have dr put in order for home health care.

## 2016-07-24 NOTE — Telephone Encounter (Signed)
Dr Mikey Bussinghoffman, pt is asking if he is going to [facility] see him?

## 2016-07-24 NOTE — Telephone Encounter (Signed)
I have asked Dr Mila HomerJ Hoffman and Burns about home health care, they will be following this up.

## 2016-07-25 ENCOUNTER — Encounter: Payer: Self-pay | Admitting: Cardiology

## 2016-07-28 NOTE — Discharge Summary (Signed)
Name: Leonard Massey MRN: [id number] DOB: [birth year] 70 y.o. PCP: Gust Rung, DO  Date of Admission: 07/18/2016  6:36 PM Date of Discharge: 07/22/2016 Attending Physician: Cephas Darby MD  Discharge Diagnosis: 1. COPD exacerbation Active Problems:   COPD exacerbation (HCC)   Cachexia (HCC)   Orthostatic hypotension   Chronic atrial fibrillation (HCC)   Presence of biventricular cardiac pacemaker   Protein-calorie malnutrition, severe (HCC)   Discharge Medications:   Medication List    STOP taking these medications   azithromycin 500 MG tablet Commonly known as:  ZITHROMAX     TAKE these medications   ALPRAZolam 0.25 MG tablet Commonly known as:  XANAX Take 0.25 mg by mouth 3 times daily   amoxicillin-clavulanate 875-125 MG tablet Commonly known as:  AUGMENTIN Take 1 tablet by mouth every 12 (twelve) hours.   aspirin EC 81 MG EC tablet Generic drug:  aspirin Take 81 mg by mouth daily. Swallow whole.   budesonide 0.5 MG/2ML nebulizer solution Commonly known as:  PULMICORT INHALE 1 VIAL VIA NEBULIZER TWICE DAILY   buPROPion 150 MG 12 hr tablet Commonly known as:  ZYBAN One pill daily for 3 days then twice daily for 12 weeks for smoking cessation   cyclobenzaprine 5 MG tablet Commonly known as:  FLEXERIL Take 1 tablet (5 mg total) by mouth 3 (three) times daily as needed for muscle spasms.   ELIQUIS 5 MG Tabs tablet Generic drug:  apixaban TAKE 1 TABLET(5 MG) BY MOUTH TWICE DAILY   HYDROcodone-acetaminophen 10-325 MG tablet Commonly known as:  NORCO Take 1 tablet by mouth every 8 (eight) hours as needed for severe pain.   ipratropium-albuterol 0.5-2.5 (3) MG/3ML Soln Commonly known as:  DUONEB Inhale 3 mLs into the lungs 4 (four) times daily.   levalbuterol 0.63 MG/3ML nebulizer solution Commonly known as:  XOPENEX Take 3 mLs (0.63 mg total) by nebulization every 2 (two) hours as needed for wheezing or shortness of breath.   metoprolol  succinate 100 MG 24 hr tablet Commonly known as:  TOPROL XL Take 1 tablet (100 mg total) by mouth daily. Take with or immediately following a meal.   potassium chloride SA 20 MEQ tablet Commonly known as:  K-DUR,KLOR-CON Take 1 tablet by mouth as directed. Take on the days you take Lasix   PROAIR HFA 108 (90 Base) MCG/ACT inhaler Generic drug:  albuterol Inhale 2 puffs into the lungs every 6 (six) hours as needed for wheezing or shortness of breath.   sertraline 50 MG tablet Commonly known as:  ZOLOFT Take 1 tablet (50 mg total) by mouth daily.   simvastatin 40 MG tablet Commonly known as:  ZOCOR TAKE 1 TABLET(40 MG) BY MOUTH AT BEDTIME   SYMBICORT 160-4.5 MCG/ACT inhaler Generic drug:  budesonide-formoterol INHALE 2 PUFFS INTO THE LUNGS TWICE DAILY       Disposition and follow-up:   [name] was discharged from [facility] in stable condition.  At the hospital follow up visit please address:  1.  Home oxygen use.  Antibiotic compliance. Breathing status  2.  Labs / imaging needed at time of follow-up: none  3.  Pending labs/ test needing follow-up: none  Follow-up Appointments: Follow-up Information    Gust Rung, DO Follow up on 07/29/2016.   Specialty:  Internal Medicine Why:  10:45 am Contact information: [address] [place] [address] [phone]           Hospital Course by  problem list: Active Problems:   COPD exacerbation (HCC)   Cachexia (HCC)   Orthostatic hypotension   Chronic atrial fibrillation (HCC)   Presence of biventricular cardiac pacemaker   Protein-calorie malnutrition, severe (HCC)   COPD exacerbation Patient was treated with prednisone 40 mg daily, ipratropium and levalbuterol Q6H and Augmentin. His breathing improved during admission and patient felt he was at his baseline breathing on day of discharge.  Patient was discharged with home oxygen. He was saturating 81% on room air while ambulating.  He was instructed to finish his antibiotic course. Transition care management follow-up telephone call reported that patient stated he was feeling okay. Since patient was stable did not need to change antibiotics.  He was told to follow-up in the internal medicine clinic.  Orthostatic hypotension Patient on admission had systolic blood pressure in the 80s.  He was given fluids and his metoprolol was held. During admission he became hypertensive and tachycardic. This was likely due from withholding his metoprolol. Once restarted patient's blood pressure normalized.  Chronic Pain Patient has osteoarthritis and is s/p cervical spine surgery. He was managed with his home pain medication flexeril 5mg  TID PRN and Norco 10-325mg  TID PRN.  History of atrial fibrillation  Patient is s/p AVN ablation and biventricular pacemaker placement.  He was continued on his home eliquis 5mg  daily.  He was stable during admission.  Severe protein caloric malnutrition Patient has had unintentional weight loss but stable for the last year. He has had extensive workup without finding a cause. It is likely due to depression as his roommate had been transferred to a nursing home recently. Nutrition was consulted and encouraged to continue taking ensure.  Migraine Headaches Stable throughout admission.  Tylenol prn was available.  Discharge Vitals:   BP 106/74   Pulse 76   Temp 98 F (36.7 C) (Oral)   Resp 20   Ht 5\' 6"  (1.676 m)   Wt 113 lb 14.4 oz (51.7 kg)   SpO2 100%   BMI 18.38 kg/m   Pertinent Labs, Studies, and Procedures:  Chest x-ray IMPRESSION: Chronic findings of COPD. No evidence of acute airspace consolidation. Mild cardiomegaly. Atherosclerotic disease of the aorta.  Discharge Instructions: Discharge Instructions    Diet - low sodium heart healthy    Complete by:  As directed    Discharge instructions    Complete by:  As directed    Please follow up in the Internal Medicine Clinic after  discharge Please use your home oxygen when walking around as needed Xopenex inhaler and nebulizer solutions have been sent to your pharmacy.  You may use these instead of your albuterol inhaler and duoneb treatments if these are causing you headaches. Please finish your antibiotic treatment.  1 pill tonight followed by 1 pill twice a day for 3 more days.   Increase activity slowly    Complete by:  As directed       Signed: Camelia PhenesJessica Ratliff Gwyn Mehring, DO 07/28/2016, 2:17 AM   Pager: [phone]

## 2016-07-29 ENCOUNTER — Ambulatory Visit (INDEPENDENT_AMBULATORY_CARE_PROVIDER_SITE_OTHER): Payer: Medicare Other | Admitting: Internal Medicine

## 2016-07-29 ENCOUNTER — Encounter: Payer: Self-pay | Admitting: Internal Medicine

## 2016-07-29 VITALS — BP 97/70 | HR 81 | Temp 98.3°F | Ht 66.0 in | Wt 105.5 lb

## 2016-07-29 DIAGNOSIS — Z7982 Long term (current) use of aspirin: Secondary | ICD-10-CM

## 2016-07-29 DIAGNOSIS — Z87891 Personal history of nicotine dependence: Secondary | ICD-10-CM

## 2016-07-29 DIAGNOSIS — R519 Headache, unspecified: Secondary | ICD-10-CM

## 2016-07-29 DIAGNOSIS — Z950 Presence of cardiac pacemaker: Secondary | ICD-10-CM

## 2016-07-29 DIAGNOSIS — I4891 Unspecified atrial fibrillation: Secondary | ICD-10-CM

## 2016-07-29 DIAGNOSIS — Z7951 Long term (current) use of inhaled steroids: Secondary | ICD-10-CM | POA: Diagnosis not present

## 2016-07-29 DIAGNOSIS — Z5189 Encounter for other specified aftercare: Secondary | ICD-10-CM

## 2016-07-29 DIAGNOSIS — Z9981 Dependence on supplemental oxygen: Secondary | ICD-10-CM | POA: Diagnosis not present

## 2016-07-29 DIAGNOSIS — R51 Headache: Secondary | ICD-10-CM

## 2016-07-29 DIAGNOSIS — R1310 Dysphagia, unspecified: Secondary | ICD-10-CM | POA: Diagnosis not present

## 2016-07-29 DIAGNOSIS — J449 Chronic obstructive pulmonary disease, unspecified: Secondary | ICD-10-CM | POA: Diagnosis not present

## 2016-07-29 DIAGNOSIS — F329 Major depressive disorder, single episode, unspecified: Secondary | ICD-10-CM | POA: Diagnosis not present

## 2016-07-29 DIAGNOSIS — F32A Depression, unspecified: Secondary | ICD-10-CM

## 2016-07-29 DIAGNOSIS — K219 Gastro-esophageal reflux disease without esophagitis: Secondary | ICD-10-CM

## 2016-07-29 MED ORDER — PANTOPRAZOLE SODIUM 20 MG PO TBEC: 20.0000 mg | DELAYED_RELEASE_TABLET | Freq: Every day | ORAL | 2 refills | Status: AC

## 2016-07-29 MED ORDER — SERTRALINE HCL 50 MG PO TABS: 100.0000 mg | ORAL_TABLET | Freq: Every day | ORAL | 3 refills | Status: AC

## 2016-07-29 NOTE — Progress Notes (Addendum)
   CC: shortness of breath  HPI:  [name] is a 70 y.o. with a PMH of CHF, COPD, GERD, HTN, HLD, A fib s/p ablation and pacemaker and PVD presenting to clinic for follow up after hospitalization for COPD exacerbation.  Please see problem based Assessment and Plan for status of patients chronic conditions.  Past Medical History:  Diagnosis Date  . Alcoholism (HCC)   . Cervical spondylosis   . Chronic diastolic CHF (congestive heart failure) (HCC)   . Complete heart block (HCC)    a. h/o permanent atrial fibrillation s/p [employer] BiV pacemaker [facility] Dr[facility] Bard [facility] in 12/2014 with concomitant AVN ablation.  [name]. COPD (chronic obstructive pulmonary disease) (HCC)   . Coronary artery calcification seen on CT scan 02/2015   a. Lexiscan nuclear stress testing 08/01/15 which was low risk, no reversible ischemia, EF 56%..  . Depression   . Essential hypertension   . GERD (gastroesophageal reflux disease)    Pt reports having EGD/colonoscopy in 2010 [place] Dr[place] in [place] w[place] showed ulcers on uppper and normal colon. No report in emr.   . Hyperlipidemia    Pt typically has low HDL and low LDL.   [name]. Low back pain   . Permanent atrial fibrillation (HCC)   . PVD (peripheral vascular disease) (HCC)    a. followed by vascular surgery (prior carotid subclavian bypass for subclavian steal ~03, bilateral aortoiliac and superficial femoral occlusive disease with calf claudication being managed medically.  . Right knee pain 8/10   Began to complain of after requesting a scooter.  Exam was negative and it was decided that pt should continue to ambulate.   . Subclavian steal syndrome    HX of.   . Tobacco abuse     Review of Systems:   Review of Systems  Constitutional: Negative for chills and fever.  HENT: Negative for hearing loss.   Eyes: Negative for blurred vision and double vision.  Respiratory: Positive for cough, sputum production (white phlegm) and shortness of breath  (with working in yard, walking to car).   Cardiovascular: Negative for chest pain and palpitations.  Neurological: Positive for headaches (bitemporal, without vision/hearing changes, w/o N).    Physical Exam: Vitals:   07/29/16 1414  BP: 97/70  Pulse: 81  Temp: 98.3 F (36.8 C)  TempSrc: Oral  SpO2: 100%  Weight: 105 lb 8 oz (47.9 kg)  Height: 5\' 6"  (1.676 m)   O2 sat with walking down to 88%.  Physical Exam   Constitutional: NAD, appears fatigued, appears older than stated age CV: regular rhythm, no murmurs, rubs or gallops appreciated Chest: pacemaker on L anterior chest without erythema, drainage or swelling Resp: rhonchi in upper lobes, minimal wheezing, no increased work of breathing Ext: warm, 2+ RA pulses  Assessment & Plan:   See Encounters Tab for problem based charting.   Patient seen with Dr. Cleda DaubE. Hoffman   Leonard MarketGorica Ty Oshima, MD Internal Medicine PGY1

## 2016-07-29 NOTE — Patient Instructions (Addendum)
We increased your zoloft to 100mg  a day (if you have 50mg  tablets, you can take 2 pills once a day)  We prescribed an acid reducer, pantoprazole 20mg . Please take it 30 minutes before your first meal of the day.  We are working on getting you the Home Health and a personal care aid.  For your headaches, take you can take tylenol up to 650mg  three times a day. You can use a heating pad on your neck to help as well.   Please call Dr. Chales AbrahamsGupta to set up an appointment.

## 2016-07-30 ENCOUNTER — Telehealth: Payer: Self-pay | Admitting: Licensed Clinical Social Worker

## 2016-07-30 NOTE — Assessment & Plan Note (Addendum)
Patient with recent hospitalization for COPD exacerbation requiring Bipap. He was discharged with extra 3 day course of augmentin which he completed. He states he feels a lot better, but still gets short of breath walking to the car or working in his yard for about 15 minutes. This is relieved by oxygen and rest. He does not use oxygen with activity due to inability to lift the oxygen tank and only needing the oxygen intermittently. Patient has some decrease in activities of daily living such as cooking and cleaning that he think he would benefit from since he lives alone. O2 sats sitting 100%, with ambulation 88% without oxygen supplementation.    Plan: --patient no longer requires continuous oxygen; his symptoms would be adequately controlled with intermittent oxygen. --continue symbicort, duoneb, xopenex --consult to social work for personal care aid --consult to home health for post-hospitalization deconditioning

## 2016-07-30 NOTE — Assessment & Plan Note (Signed)
Patient with bitemporal headache, neck muscle strain and tension, that is refractory to opioids. Patient states he has no relief with regular strength tylenol either. CT head was obtained and was negative for any masses, lesions, or abnormalities other than chronic microvascular changes that are consistent with his age and comorbidities. From description, headache appears to be tension-type.   Plan:  --use warmth on neck to relax muscles --tylenol 650mg  TID PRN for headache

## 2016-07-30 NOTE — Assessment & Plan Note (Addendum)
Patient with no overt symptoms of GERD and has been off PPI for over a year. He does have some dysphagia to solids but no problems with liquids. No odynophagia, regurgitation or vomiting.  He does endorse decreased appetite. His symptoms are consistent with recurrent GERD, though will evaluate for other etiology like strictures, webs, rings, or carcinoma.  Plan: --start protonix 20mg  daily --advised patient to f/u with GI Dr. Chales AbrahamsGupta for possible EGD for direct evaluation of esophagus to determine etiology of dysphagia.

## 2016-07-30 NOTE — Assessment & Plan Note (Signed)
Patient with history of depression on zoloft 50mg  daily. He has been pretty limited lately due to COPD in the amount of activity and thus interaction he is able to get. He has been having decreased appetite, decreased mood, and some weight loss; these are consistent with markers of depression, though other etiologies are being worked up as well.  Plan: --increase zoloft to 100mg  daily --will f/u with PCP for monitoring and effect of dose change

## 2016-07-30 NOTE — Telephone Encounter (Signed)
CSW received referral for home health and PCS for Leonard Massey.  Pt's sister listed as main contact for Leonard Massey.  CSW placed call to sister, Leonard Massey to obtain [facility].  Sister states pt has not had home health in the past and is not familiar with any agency in the [place] area.  Pt/Family agreeable to agency that accepts traditional Medicare.  CSW discussed PCS, sister agreeable to be listed as an alternate contact for [facility].   [facility], awaiting confirmation.  PCS form initiated. Leonard Massey lives alone and is supported well by his sister and daugther who make frequent trips to check on Leonard Massey.  Sister states pt and children have inquired about general POA forms.  CSW informed Ms. [facility] to [facility] information.  CSW will send information in the mail regarding healthcare POA.

## 2016-07-31 ENCOUNTER — Ambulatory Visit (INDEPENDENT_AMBULATORY_CARE_PROVIDER_SITE_OTHER): Payer: Medicare Other

## 2016-07-31 DIAGNOSIS — Z950 Presence of cardiac pacemaker: Secondary | ICD-10-CM | POA: Diagnosis not present

## 2016-07-31 DIAGNOSIS — I5032 Chronic diastolic (congestive) heart failure: Secondary | ICD-10-CM

## 2016-07-31 NOTE — Progress Notes (Signed)
EPIC Encounter for ICM Monitoring  Patient Name: Leonard LeekJohn W Massey is a 70 y.o. male Date: 07/31/2016 Primary Care Physican: Gust RungErik C Hoffman, DO Primary Cardiologist:Allred Electrophysiologist: Allred Dry Weight: 111 lb  Bi-V Pacing:  >99%         Heart Failure questions reviewed, pt asymptomatic   Thoracic impedance abnormal suggesting fluid accumulation.   Patient hospitalized during decreased impedance for lung infection and COPD exacerbation.   Recommendations:  Reviewed with Dr Johney FrameAllred in the office.   He recommended to repeat transmission in 2 weeks since patient had lung infection in the last week.  Advised to limit salt and fluid intake.    Call back to patient and advised to limit salt and fluid intake in attempts to balance fluid levels.  Will repeat transmission 08/13/2016.  Advised to call if he becomes symptomatic.    Follow-up plan: ICM clinic phone appointment on 08/13/2016 to recheck fluid levels.    ICM trend: 07/31/2016       Karie SodaLaurie S Shadasia Oldfield, RN 07/31/2016 10:40 AM

## 2016-08-01 ENCOUNTER — Telehealth: Payer: Self-pay | Admitting: *Deleted

## 2016-08-01 NOTE — Telephone Encounter (Signed)
[facility] - states O2 level 84 - 94% on RA; and BP 140/70 then 182/90 but states pt had severe h/a at that time. And pt refused to go to the ER. I callde and talked to pt - stated he's "feels fine". Headache has subsided and wearing his oxygen now, breathing ok. Told him to call if anything changes; stated he would.

## 2016-08-01 NOTE — Telephone Encounter (Signed)
Thank you, it sounds like his HA may be related to hypoxia and encouraging O2 sat 89-94% would be beneficial.

## 2016-08-06 ENCOUNTER — Telehealth: Payer: Self-pay | Admitting: Internal Medicine

## 2016-08-06 NOTE — Telephone Encounter (Signed)
[facility] PT Lucianne Muss(Kumar) VO 3 weeks for 5 visits.

## 2016-08-07 NOTE — Telephone Encounter (Signed)
ATTEMPTED TO RTC, NO ANSWER

## 2016-08-13 ENCOUNTER — Ambulatory Visit (INDEPENDENT_AMBULATORY_CARE_PROVIDER_SITE_OTHER): Payer: Medicare Other

## 2016-08-13 DIAGNOSIS — I5032 Chronic diastolic (congestive) heart failure: Secondary | ICD-10-CM

## 2016-08-13 DIAGNOSIS — Z950 Presence of cardiac pacemaker: Secondary | ICD-10-CM

## 2016-08-13 NOTE — Progress Notes (Signed)
EPIC Encounter for ICM Monitoring  Patient Name: Leonard Massey is a 70 y.o. male Date: 08/13/2016 Primary Care Physican: Gust RungErik C Hoffman, DO Primary Cardiologist:Allred Electrophysiologist: Allred Dry Weight:    111 lb  Bi-V Pacing: >99%      Heart Failure questions reviewed, pt asymptomatic   Thoracic impedance returned to normal since 07/31/2016 by adjusting diet.   Recommendations: No changes.  Advised to limit salt intake to 2000 mg daily.  Encouraged to call for fluid symptoms.    Follow-up plan: ICM clinic phone appointment on 09/23/2016.  Copy of ICM check sent to device physician.   ICM trend: 08/23/2016       Karie SodaLaurie S Short, RN 08/13/2016 3:12 PM

## 2016-08-14 ENCOUNTER — Ambulatory Visit: Payer: Medicare Other | Admitting: Internal Medicine

## 2016-08-22 ENCOUNTER — Encounter (HOSPITAL_COMMUNITY): Payer: Medicare Other

## 2016-08-22 ENCOUNTER — Ambulatory Visit: Payer: Medicare Other | Admitting: Vascular Surgery

## 2016-09-01 ENCOUNTER — Telehealth: Payer: Self-pay | Admitting: Internal Medicine

## 2016-09-01 ENCOUNTER — Encounter (HOSPITAL_COMMUNITY): Payer: Self-pay

## 2016-09-01 ENCOUNTER — Emergency Department (HOSPITAL_COMMUNITY): Payer: Medicare Other

## 2016-09-01 ENCOUNTER — Inpatient Hospital Stay (HOSPITAL_COMMUNITY)
Admission: EM | Admit: 2016-09-01 | Discharge: 2016-09-04 | DRG: 190 | Disposition: A | Discharge status: Hospice | Payer: Medicare Other | Attending: Internal Medicine | Admitting: Internal Medicine

## 2016-09-01 DIAGNOSIS — I482 Chronic atrial fibrillation: Secondary | ICD-10-CM | POA: Diagnosis present

## 2016-09-01 DIAGNOSIS — J9611 Chronic respiratory failure with hypoxia: Secondary | ICD-10-CM | POA: Diagnosis not present

## 2016-09-01 DIAGNOSIS — E43 Unspecified severe protein-calorie malnutrition: Secondary | ICD-10-CM | POA: Diagnosis present

## 2016-09-01 DIAGNOSIS — Z950 Presence of cardiac pacemaker: Secondary | ICD-10-CM

## 2016-09-01 DIAGNOSIS — Z981 Arthrodesis status: Secondary | ICD-10-CM | POA: Diagnosis not present

## 2016-09-01 DIAGNOSIS — E785 Hyperlipidemia, unspecified: Secondary | ICD-10-CM | POA: Diagnosis present

## 2016-09-01 DIAGNOSIS — R930 Abnormal findings on diagnostic imaging of skull and head, not elsewhere classified: Secondary | ICD-10-CM

## 2016-09-01 DIAGNOSIS — R64 Cachexia: Secondary | ICD-10-CM | POA: Diagnosis present

## 2016-09-01 DIAGNOSIS — R519 Headache, unspecified: Secondary | ICD-10-CM | POA: Diagnosis present

## 2016-09-01 DIAGNOSIS — R51 Headache: Secondary | ICD-10-CM

## 2016-09-01 DIAGNOSIS — F1021 Alcohol dependence, in remission: Secondary | ICD-10-CM

## 2016-09-01 DIAGNOSIS — Z79899 Other long term (current) drug therapy: Secondary | ICD-10-CM

## 2016-09-01 DIAGNOSIS — Z7901 Long term (current) use of anticoagulants: Secondary | ICD-10-CM

## 2016-09-01 DIAGNOSIS — G894 Chronic pain syndrome: Secondary | ICD-10-CM | POA: Diagnosis present

## 2016-09-01 DIAGNOSIS — I5032 Chronic diastolic (congestive) heart failure: Secondary | ICD-10-CM | POA: Diagnosis present

## 2016-09-01 DIAGNOSIS — G939 Disorder of brain, unspecified: Secondary | ICD-10-CM

## 2016-09-01 DIAGNOSIS — Z7982 Long term (current) use of aspirin: Secondary | ICD-10-CM

## 2016-09-01 DIAGNOSIS — R251 Tremor, unspecified: Secondary | ICD-10-CM | POA: Diagnosis present

## 2016-09-01 DIAGNOSIS — J9621 Acute and chronic respiratory failure with hypoxia: Secondary | ICD-10-CM | POA: Diagnosis present

## 2016-09-01 DIAGNOSIS — R079 Chest pain, unspecified: Secondary | ICD-10-CM | POA: Diagnosis not present

## 2016-09-01 DIAGNOSIS — Z66 Do not resuscitate: Secondary | ICD-10-CM | POA: Diagnosis present

## 2016-09-01 DIAGNOSIS — I4891 Unspecified atrial fibrillation: Secondary | ICD-10-CM | POA: Diagnosis not present

## 2016-09-01 DIAGNOSIS — Z681 Body mass index (BMI) 19 or less, adult: Secondary | ICD-10-CM | POA: Diagnosis not present

## 2016-09-01 DIAGNOSIS — I639 Cerebral infarction, unspecified: Secondary | ICD-10-CM | POA: Diagnosis not present

## 2016-09-01 DIAGNOSIS — Z8249 Family history of ischemic heart disease and other diseases of the circulatory system: Secondary | ICD-10-CM

## 2016-09-01 DIAGNOSIS — I739 Peripheral vascular disease, unspecified: Secondary | ICD-10-CM | POA: Diagnosis present

## 2016-09-01 DIAGNOSIS — G43109 Migraine with aura, not intractable, without status migrainosus: Secondary | ICD-10-CM | POA: Diagnosis present

## 2016-09-01 DIAGNOSIS — Z9981 Dependence on supplemental oxygen: Secondary | ICD-10-CM

## 2016-09-01 DIAGNOSIS — I2510 Atherosclerotic heart disease of native coronary artery without angina pectoris: Secondary | ICD-10-CM | POA: Diagnosis present

## 2016-09-01 DIAGNOSIS — F329 Major depressive disorder, single episode, unspecified: Secondary | ICD-10-CM | POA: Diagnosis present

## 2016-09-01 DIAGNOSIS — K219 Gastro-esophageal reflux disease without esophagitis: Secondary | ICD-10-CM | POA: Diagnosis present

## 2016-09-01 DIAGNOSIS — I10 Essential (primary) hypertension: Secondary | ICD-10-CM | POA: Diagnosis present

## 2016-09-01 DIAGNOSIS — I110 Hypertensive heart disease with heart failure: Secondary | ICD-10-CM | POA: Diagnosis present

## 2016-09-01 DIAGNOSIS — Z87891 Personal history of nicotine dependence: Secondary | ICD-10-CM

## 2016-09-01 DIAGNOSIS — F1011 Alcohol abuse, in remission: Secondary | ICD-10-CM | POA: Diagnosis present

## 2016-09-01 DIAGNOSIS — R634 Abnormal weight loss: Secondary | ICD-10-CM | POA: Diagnosis present

## 2016-09-01 DIAGNOSIS — Z7951 Long term (current) use of inhaled steroids: Secondary | ICD-10-CM | POA: Diagnosis not present

## 2016-09-01 DIAGNOSIS — Z888 Allergy status to other drugs, medicaments and biological substances status: Secondary | ICD-10-CM

## 2016-09-01 DIAGNOSIS — Z886 Allergy status to analgesic agent status: Secondary | ICD-10-CM

## 2016-09-01 DIAGNOSIS — J441 Chronic obstructive pulmonary disease with (acute) exacerbation: Principal | ICD-10-CM | POA: Diagnosis present

## 2016-09-01 LAB — URINALYSIS, ROUTINE W REFLEX MICROSCOPIC
GLUCOSE, UA: NEGATIVE mg/dL
Hgb urine dipstick: NEGATIVE
Ketones, ur: 15 mg/dL — AB
Leukocytes, UA: NEGATIVE
Nitrite: NEGATIVE
PH: 5.5 (ref 5.0–8.0)
Protein, ur: NEGATIVE mg/dL
SPECIFIC GRAVITY, URINE: 1.019 (ref 1.005–1.030)

## 2016-09-01 LAB — BASIC METABOLIC PANEL
Anion gap: 18 — ABNORMAL HIGH (ref 5–15)
Anion gap: 11 (ref 5–15)
BUN: 25 mg/dL — AB (ref 6–20)
BUN: 27 mg/dL — AB (ref 6–20)
CALCIUM: 9.7 mg/dL (ref 8.9–10.3)
CHLORIDE: 95 mmol/L — AB (ref 101–111)
CO2: 21 mmol/L — AB (ref 22–32)
CO2: 26 mmol/L (ref 22–32)
CREATININE: 0.84 mg/dL (ref 0.61–1.24)
Calcium: 9.6 mg/dL (ref 8.9–10.3)
Chloride: 100 mmol/L — ABNORMAL LOW (ref 101–111)
Creatinine, Ser: 0.77 mg/dL (ref 0.61–1.24)
GFR calc Af Amer: >60 mL/min (ref >60)
GFR calc Af Amer: >60 mL/min (ref >60)
GFR calc non Af Amer: >60 mL/min (ref >60)
GLUCOSE: 172 mg/dL — AB (ref 65–99)
Glucose, Bld: 178 mg/dL — ABNORMAL HIGH (ref 65–99)
POTASSIUM: 4.5 mmol/L (ref 3.5–5.1)
Potassium: 5 mmol/L (ref 3.5–5.1)
SODIUM: 134 mmol/L — AB (ref 135–145)
Sodium: 137 mmol/L (ref 135–145)

## 2016-09-01 LAB — I-STAT ARTERIAL BLOOD GAS, ED
Acid-base deficit: 3 mmol/L — ABNORMAL HIGH (ref 0.0–2.0)
BICARBONATE: 23.4 mmol/L (ref 20.0–28.0)
O2 Saturation: 98 %
PCO2 ART: 42.8 mmHg (ref 32.0–48.0)
PO2 ART: 105 mmHg (ref 83.0–108.0)
Patient temperature: 97.9
TCO2: 25 mmol/L (ref 0–100)
pH, Arterial: 7.344 — ABNORMAL LOW (ref 7.350–7.450)

## 2016-09-01 LAB — CBC
HCT: 51.3 % (ref 39.0–52.0)
Hemoglobin: 17.2 g/dL — ABNORMAL HIGH (ref 13.0–17.0)
MCH: 31.4 pg (ref 26.0–34.0)
MCHC: 33.5 g/dL (ref 30.0–36.0)
MCV: 93.8 fL (ref 78.0–100.0)
Platelets: 236 10*3/uL (ref 150–400)
RBC: 5.47 MIL/uL (ref 4.22–5.81)
RDW: 13.7 % (ref 11.5–15.5)
WBC: 12.3 10*3/uL — ABNORMAL HIGH (ref 4.0–10.5)

## 2016-09-01 LAB — TROPONIN I
Troponin I: 0.03 ng/mL (ref <0.03)
Troponin I: <0.03 ng/mL (ref <0.03)

## 2016-09-01 LAB — I-STAT TROPONIN, ED: TROPONIN I, POC: 0.03 ng/mL (ref 0.00–0.08)

## 2016-09-01 LAB — BRAIN NATRIURETIC PEPTIDE: B Natriuretic Peptide: 287 pg/mL — ABNORMAL HIGH (ref 0.0–100.0)

## 2016-09-01 MED ORDER — PANTOPRAZOLE SODIUM 20 MG PO TBEC
20.0000 mg | DELAYED_RELEASE_TABLET | Freq: Every day | ORAL | Status: DC
  Administered 2016-09-02 – 2016-09-04 (×3): 20 mg via ORAL
  Filled 2016-09-01 (×3): qty 1

## 2016-09-01 MED ORDER — LEVOFLOXACIN 750 MG PO TABS
750.0000 mg | ORAL_TABLET | ORAL | Status: DC
  Administered 2016-09-01: 750 mg via ORAL
  Filled 2016-09-01: qty 1

## 2016-09-01 MED ORDER — ALPRAZOLAM 0.25 MG PO TABS
0.2500 mg | ORAL_TABLET | Freq: Three times a day (TID) | ORAL | Status: DC | PRN
  Administered 2016-09-01 – 2016-09-03 (×3): 0.25 mg via ORAL
  Filled 2016-09-01 (×3): qty 1

## 2016-09-01 MED ORDER — SODIUM CHLORIDE 0.9 % IV BOLUS (SEPSIS)
1000.0000 mL | Freq: Once | INTRAVENOUS | Status: AC
  Administered 2016-09-01: 1000 mL via INTRAVENOUS

## 2016-09-01 MED ORDER — ASPIRIN EC 325 MG PO TBEC
325.0000 mg | DELAYED_RELEASE_TABLET | Freq: Once | ORAL | Status: AC
  Administered 2016-09-01: 325 mg via ORAL
  Filled 2016-09-01: qty 1

## 2016-09-01 MED ORDER — LEVOFLOXACIN 750 MG PO TABS: 750.0000 mg | ORAL_TABLET | Freq: Every day | ORAL | Status: DC

## 2016-09-01 MED ORDER — SODIUM CHLORIDE 0.9% FLUSH
3.0000 mL | Freq: Two times a day (BID) | INTRAVENOUS | Status: DC
  Administered 2016-09-01 – 2016-09-04 (×7): 3 mL via INTRAVENOUS

## 2016-09-01 MED ORDER — SIMVASTATIN 40 MG PO TABS
40.0000 mg | ORAL_TABLET | Freq: Every day | ORAL | Status: DC
  Administered 2016-09-01 – 2016-09-03 (×3): 40 mg via ORAL
  Filled 2016-09-01 (×3): qty 1

## 2016-09-01 MED ORDER — BUDESONIDE 0.5 MG/2ML IN SUSP
0.5000 mg | Freq: Two times a day (BID) | RESPIRATORY_TRACT | Status: DC
  Administered 2016-09-01 – 2016-09-04 (×6): 0.5 mg via RESPIRATORY_TRACT
  Filled 2016-09-01 (×7): qty 2

## 2016-09-01 MED ORDER — ASPIRIN EC 81 MG PO TBEC
81.0000 mg | DELAYED_RELEASE_TABLET | Freq: Every day | ORAL | Status: DC
  Administered 2016-09-02 – 2016-09-04 (×3): 81 mg via ORAL
  Filled 2016-09-01 (×3): qty 1

## 2016-09-01 MED ORDER — ENSURE ENLIVE PO LIQD
237.0000 mL | Freq: Three times a day (TID) | ORAL | Status: DC
  Administered 2016-09-01 – 2016-09-02 (×3): 237 mL via ORAL

## 2016-09-01 MED ORDER — ACETAMINOPHEN 325 MG PO TABS
650.0000 mg | ORAL_TABLET | Freq: Four times a day (QID) | ORAL | Status: DC | PRN
  Administered 2016-09-02 (×2): 650 mg via ORAL
  Filled 2016-09-01 (×2): qty 2

## 2016-09-01 MED ORDER — METHYLPREDNISOLONE SODIUM SUCC 125 MG IJ SOLR
125.0000 mg | Freq: Once | INTRAMUSCULAR | Status: AC
  Administered 2016-09-01: 125 mg via INTRAVENOUS
  Filled 2016-09-01: qty 2

## 2016-09-01 MED ORDER — ALBUTEROL SULFATE (2.5 MG/3ML) 0.083% IN NEBU
2.5000 mg | INHALATION_SOLUTION | RESPIRATORY_TRACT | Status: DC | PRN
  Administered 2016-09-04: 2.5 mg via RESPIRATORY_TRACT
  Filled 2016-09-01: qty 3

## 2016-09-01 MED ORDER — ACETAMINOPHEN 650 MG RE SUPP: 650.0000 mg | Freq: Four times a day (QID) | RECTAL | Status: DC | PRN

## 2016-09-01 MED ORDER — PREDNISONE 20 MG PO TABS
40.0000 mg | ORAL_TABLET | Freq: Every day | ORAL | Status: DC
  Administered 2016-09-02 – 2016-09-04 (×3): 40 mg via ORAL
  Filled 2016-09-01 (×3): qty 2

## 2016-09-01 MED ORDER — MORPHINE SULFATE (PF) 4 MG/ML IV SOLN
4.0000 mg | Freq: Once | INTRAVENOUS | Status: AC
  Administered 2016-09-01: 4 mg via INTRAVENOUS
  Filled 2016-09-01: qty 1

## 2016-09-01 MED ORDER — IPRATROPIUM-ALBUTEROL 0.5-2.5 (3) MG/3ML IN SOLN
3.0000 mL | Freq: Once | RESPIRATORY_TRACT | Status: AC
Start: 2016-09-01 — End: 2016-09-01
  Administered 2016-09-01: 3 mL via RESPIRATORY_TRACT
  Filled 2016-09-01: qty 3

## 2016-09-01 MED ORDER — CYCLOBENZAPRINE HCL 5 MG PO TABS
5.0000 mg | ORAL_TABLET | Freq: Three times a day (TID) | ORAL | Status: DC | PRN
  Administered 2016-09-01: 5 mg via ORAL
  Filled 2016-09-01: qty 1

## 2016-09-01 MED ORDER — ACETAMINOPHEN 500 MG PO TABS
1000.0000 mg | ORAL_TABLET | Freq: Once | ORAL | Status: AC
  Administered 2016-09-01: 1000 mg via ORAL
  Filled 2016-09-01: qty 2

## 2016-09-01 MED ORDER — SENNOSIDES-DOCUSATE SODIUM 8.6-50 MG PO TABS
1.0000 | ORAL_TABLET | Freq: Every evening | ORAL | Status: DC | PRN
  Filled 2016-09-01: qty 1

## 2016-09-01 MED ORDER — ALBUTEROL (5 MG/ML) CONTINUOUS INHALATION SOLN
10.0000 mg/h | INHALATION_SOLUTION | Freq: Once | RESPIRATORY_TRACT | Status: AC
  Administered 2016-09-01: 10 mg/h via RESPIRATORY_TRACT
  Filled 2016-09-01: qty 20

## 2016-09-01 MED ORDER — METOPROLOL SUCCINATE ER 100 MG PO TB24
100.0000 mg | ORAL_TABLET | Freq: Every day | ORAL | Status: DC
  Administered 2016-09-01 – 2016-09-04 (×4): 100 mg via ORAL
  Filled 2016-09-01: qty 4
  Filled 2016-09-01 (×3): qty 1

## 2016-09-01 MED ORDER — NITROGLYCERIN 0.4 MG SL SUBL
0.4000 mg | SUBLINGUAL_TABLET | SUBLINGUAL | Status: DC | PRN
  Administered 2016-09-01: 0.4 mg via SUBLINGUAL
  Filled 2016-09-01: qty 1

## 2016-09-01 MED ORDER — SERTRALINE HCL 100 MG PO TABS
100.0000 mg | ORAL_TABLET | Freq: Every day | ORAL | Status: DC
  Administered 2016-09-01 – 2016-09-04 (×4): 100 mg via ORAL
  Filled 2016-09-01 (×2): qty 1
  Filled 2016-09-01: qty 2
  Filled 2016-09-01: qty 1

## 2016-09-01 MED ORDER — IPRATROPIUM-ALBUTEROL 0.5-2.5 (3) MG/3ML IN SOLN
3.0000 mL | RESPIRATORY_TRACT | Status: DC
  Administered 2016-09-01 (×2): 3 mL via RESPIRATORY_TRACT
  Filled 2016-09-01 (×3): qty 3

## 2016-09-01 MED ORDER — APIXABAN 5 MG PO TABS
5.0000 mg | ORAL_TABLET | Freq: Two times a day (BID) | ORAL | Status: DC
  Administered 2016-09-01 – 2016-09-04 (×6): 5 mg via ORAL
  Filled 2016-09-01 (×6): qty 1

## 2016-09-01 NOTE — ED Notes (Signed)
MD made aware of pt blood pressure.  Further orders given.

## 2016-09-01 NOTE — Telephone Encounter (Signed)
Noted, it appears he has been or being admitted to my team.

## 2016-09-01 NOTE — ED Notes (Signed)
Pt brought back from CT appears SOB L/S have prolonged expiratory wheezed noted, ER MD brought to room RT called for continual neb tx. Pt remains on the monitor with family at bedside will continue to monitor

## 2016-09-01 NOTE — Telephone Encounter (Signed)
Calling to let dr Mikey Bussinghoffman know pt was in ED

## 2016-09-01 NOTE — ED Notes (Signed)
Respiratory called for Bipap

## 2016-09-01 NOTE — ED Notes (Addendum)
Pt taken off of the Bipap to eat will see  how pt tolerates

## 2016-09-01 NOTE — ED Triage Notes (Signed)
Pt reports chest pain started last night. He reports the pain radiates into his neck. Pt reports nausea at triage.

## 2016-09-01 NOTE — ED Provider Notes (Signed)
[facility]-EMERGENCY DEPT Provider Note   CSN: [id number] Arrival date & time: 09/01/16  1000     History   Chief Complaint Chief Complaint  Patient presents with  . Chest Pain    HPI Leonard Massey is a 70 y.o. male.  Pt presents to the ED today with CP and SOB.  Pt said that it radiates up into the left side of his neck.  He did not take any of his medications this morning.  Pt denies f/c.      Past Medical History:  Diagnosis Date  . Alcoholism (HCC)   . Cervical spondylosis   . Chronic diastolic CHF (congestive heart failure) (HCC)   . Complete heart block (HCC)    a. h/o permanent atrial fibrillation s/p [employer] BiV pacemaker implanted by Dr. Bard Herbert at [facility] in 12/2014 with concomitant AVN ablation.  [name] COPD (chronic obstructive pulmonary disease) (HCC)   . Coronary artery calcification seen on CT scan 02/2015   a. Lexiscan nuclear stress testing 08/01/15 which was low risk, no reversible ischemia, EF 56%..  . Depression   . Essential hypertension   . GERD (gastroesophageal reflux disease)    Pt reports having EGD/colonoscopy in 2010 by Dr. [name] in [place] wich showed ulcers on uppper and normal colon. No report in emr.   . Hyperlipidemia    Pt typically has low HDL and low LDL.   [name] Low back pain   . Permanent atrial fibrillation (HCC)   . PVD (peripheral vascular disease) (HCC)    a. followed by vascular surgery (prior carotid subclavian bypass for subclavian steal ~03, bilateral aortoiliac and superficial femoral occlusive disease with calf claudication being managed medically.  . Right knee pain 8/10   Began to complain of after requesting a scooter.  Exam was negative and it was decided that pt should continue to ambulate.   . Subclavian steal syndrome    HX of.   . Tobacco abuse     Patient Active Problem List   Diagnosis Date Noted  . Presence of biventricular cardiac pacemaker   . Protein-calorie malnutrition, severe (HCC)   . Cachexia (HCC)   .  Orthostatic hypotension   . Chronic atrial fibrillation (HCC)   . COPD exacerbation (HCC) 07/18/2016  . Headache 07/07/2016  . Body mass index (BMI) of 19 or less in adult 03/27/2016  . PAD (peripheral artery disease) (HCC) 12/25/2015  . PVD (peripheral vascular disease) (HCC)   . Diverticulosis of colon 06/29/2015  . Ear drainage right 06/17/2015  . Complete heart block (HCC) 04/12/2015  . Coronary artery calcification seen on CT scan 02/25/2015  . Abnormal screening CT of chest 12/20/2014  . Long term current use of opiate analgesic 11/15/2014  . Malaise 09/07/2014  . Loss of weight 06/01/2014  . Chronic pain syndrome 01/12/2014  . Routine adult health maintenance 01/12/2014  . Permanent atrial fibrillation (HCC) 10/24/2013  . Long term current use of anticoagulant therapy- Eliquis 10/24/2013  . Decreased right shoulder range of motion 06/02/2013  . Tobacco use disorder 06/02/2013  . Carotid arterial disease (HCC) 01/18/2013  . Numbness and tingling in right hand 10/07/2012  . KNEE PAIN, RIGHT, CHRONIC 06/14/2009  . LOW BACK PAIN SYNDROME 10/15/2006  . Hyperlipidemia 09/09/2006  . ABUSE, ALCOHOL, IN REMISSION 09/09/2006  . Depression 09/09/2006  . Essential hypertension 09/09/2006  . 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 COPD, severe (HCC) 09/09/2006  . GERD 09/09/2006  . CAROTID ENDARTERECTOMY, HX OF 09/09/2006  Past Surgical History:  Procedure Laterality Date  . Anterior cervical diskectomy and fusion at C4-5 and C5-6  2002   Dr. Wynetta Emery  . AV nodal ablation  3/16   At [facility]  . carotid endartectomy Left   . left common carotid to subclavian artery bypass  04/1999  . PACEMAKER INSERTION  01/04/2015   SJM PM 3242 CRT-P implanted by Dr Sharol Harness at [facility] for AV block s/p AV nodal ablation       Home Medications    Prior to Admission medications   Medication Sig Start Date End Date Taking? Authorizing Provider  albuterol (PROAIR HFA) 108 (90 BASE)  MCG/ACT inhaler Inhale 2 puffs into the lungs every 6 (six) hours as needed for wheezing or shortness of breath.    Yes Historical Provider, MD  ALPRAZolam Prudy Feeler) 0.25 MG tablet Take 0.25 mg by mouth 3 times daily 07/25/15  Yes Historical Provider, MD  aspirin (ASPIRIN EC) 81 MG EC tablet Take 81 mg by mouth daily. Swallow whole.   Yes Historical Provider, MD  Aspirin-Acetaminophen-Caffeine (GOODY HEADACHE PO) Take 1 packet by mouth daily as needed (headache).   Yes Historical Provider, MD  budesonide (PULMICORT) 0.5 MG/2ML nebulizer solution INHALE 1 VIAL VIA NEBULIZER TWICE DAILY 05/10/15  Yes Historical Provider, MD  cyclobenzaprine (FLEXERIL) 5 MG tablet Take 1 tablet (5 mg total) by mouth 3 (three) times daily as needed for muscle spasms. 07/07/16  Yes Arnetha Courser, MD  ELIQUIS 5 MG TABS tablet TAKE 1 TABLET(5 MG) BY MOUTH TWICE DAILY 05/13/16  Yes Gust Rung, DO  HYDROcodone-acetaminophen (NORCO) 10-325 MG tablet Take 1 tablet by mouth every 8 (eight) hours as needed for severe pain. 07/17/16  Yes Gust Rung, DO  ipratropium-albuterol (DUONEB) 0.5-2.5 (3) MG/3ML SOLN Inhale 3 mLs into the lungs 4 (four) times daily.  01/16/15  Yes Historical Provider, MD  levalbuterol (XOPENEX HFA) 45 MCG/ACT inhaler INHALE 2 PUFFS INTO THE LUNGS EVERY 4 HOURS AS NEEDED FOR WHEEZING 07/23/16  Yes Gust Rung, DO  levalbuterol (XOPENEX) 0.63 MG/3ML nebulizer solution Take 3 mLs (0.63 mg total) by nebulization every 2 (two) hours as needed for wheezing or shortness of breath. 07/22/16  Yes Jessica Ratliff Hoffman, DO  metoprolol succinate (TOPROL XL) 100 MG 24 hr tablet Take 1 tablet (100 mg total) by mouth daily. Take with or immediately following a meal. 03/08/16 03/07/17 Yes Gust Rung, DO  pantoprazole (PROTONIX) 20 MG tablet Take 1 tablet (20 mg total) by mouth daily. 07/29/16 07/29/17 Yes Nyra Market, MD  potassium chloride SA (K-DUR,KLOR-CON) 20 MEQ tablet Take 1 tablet by mouth as directed. Take on  the days you take Lasix 07/05/15  Yes Historical Provider, MD  sertraline (ZOLOFT) 50 MG tablet Take 2 tablets (100 mg total) by mouth daily. 07/29/16  Yes Nyra Market, MD  simvastatin (ZOCOR) 40 MG tablet TAKE 1 TABLET(40 MG) BY MOUTH AT BEDTIME 03/31/16  Yes Nischal Narendra, MD  SYMBICORT 160-4.5 MCG/ACT inhaler INHALE 2 PUFFS INTO THE LUNGS TWICE DAILY 06/07/15  Yes Historical Provider, MD    Family History Family History  Problem Relation Age of Onset  . Heart failure Mother   . Heart attack Mother   . Hypertension Sister   . Heart attack Sister   . Stroke Neg Hx     Social History Social History  Substance Use Topics  . Smoking status: Former Smoker    Packs/day: 0.50    Years: 50.00    Types:  Cigarettes    Start date: 07/15/2013    Quit date: 03/27/2016  . Smokeless tobacco: Former NeurosurgeonUser    Quit date: 02/01/2012     Comment: Using E-cigs. pt smoking about 3 cigarettes a month 04-12-15  . Alcohol use No     Allergies   Aspirin and Lasix [furosemide]   Review of Systems Review of Systems  Respiratory: Positive for shortness of breath.   Cardiovascular: Positive for chest pain.  All other systems reviewed and are negative.    Physical Exam Updated Vital Signs BP 147/79   Pulse 71   Temp 97.9 F (36.6 C) (Oral)   Resp (!) 27   SpO2 97%   Physical Exam  Constitutional: He is oriented to person, place, and time. He appears well-developed. He appears distressed.  HENT:  Head: Normocephalic and atraumatic.  Right Ear: External ear normal.  Left Ear: External ear normal.  Nose: Nose normal.  Mouth/Throat: Oropharynx is clear and moist.  Eyes: Conjunctivae and EOM are normal. Pupils are equal, round, and reactive to light.  Neck: Normal range of motion.  Cardiovascular: Normal rate, regular rhythm, normal heart sounds and intact distal pulses.   Pulmonary/Chest: Tachypnea noted. He is in respiratory distress. He has wheezes. He has rales.  Abdominal: Soft. Bowel  sounds are normal.  Musculoskeletal: Normal range of motion.  Neurological: He is alert and oriented to person, place, and time.  Skin: Skin is warm and dry. Capillary refill takes less than 2 seconds.  Psychiatric: He has a normal mood and affect. His behavior is normal. Judgment and thought content normal.  Nursing note and vitals reviewed.    ED Treatments / Results  Labs (all labs ordered are listed, but only abnormal results are displayed) Labs Reviewed  BASIC METABOLIC PANEL - Abnormal; Notable for the following:       Result Value   Sodium 134 (*)    Chloride 95 (*)    CO2 21 (*)    Glucose, Bld 178 (*)    BUN 25 (*)    Anion gap 18 (*)    All other components within normal limits  CBC - Abnormal; Notable for the following:    WBC 12.3 (*)    Hemoglobin 17.2 (*)    All other components within normal limits  BRAIN NATRIURETIC PEPTIDE - Abnormal; Notable for the following:    B Natriuretic Peptide 287.0 (*)    All other components within normal limits  URINALYSIS, ROUTINE W REFLEX MICROSCOPIC (NOT AT [facility]) - Abnormal; Notable for the following:    Color, Urine AMBER (*)    Bilirubin Urine SMALL (*)    Ketones, ur 15 (*)    All other components within normal limits  I-STAT TROPOININ, ED  I-STAT ARTERIAL BLOOD GAS, ED    EKG  EKG Interpretation  Date/Time:  Monday September 01 2016 10:05:29 EST Ventricular Rate:  71 PR Interval:    QRS Duration: 132 QT Interval:  458 QTC Calculation: 497 R Axis:   -100 Text Interpretation:  Ventricular-paced rhythm Biventricular pacemaker detected Abnormal ECG Confirmed by [facility] MD, Neziah Vogelgesang ([id number]) on 09/01/2016 10:13:52 AM Also confirmed by [facility] MD, Marlon Vonruden ([id number]), editor [place], Cala BradfordKIMBERLY [phone]  on 09/01/2016 11:57:25 AM       Radiology Ct Head Wo Contrast  Result Date: 09/01/2016 CLINICAL DATA:  Frontal headache with altered mental status EXAM: CT HEAD WITHOUT CONTRAST TECHNIQUE: Contiguous axial images were obtained  from the base of the skull through the vertex without  intravenous contrast. COMPARISON:  Frontal headache and altered mental status FINDINGS: Brain: The ventricles are normal in size and configuration. There is, however, moderate parietal lobe atrophy bilaterally. There is no intracranial mass, hemorrhage, extra-axial fluid collection, or midline shift. There is a focal area of decreased attenuation in the right pons, concerning for potentially recent infarct. There is decreased attenuation in the inferior posterior right centrum semiovale. Elsewhere gray-white compartments are normal. Vascular: No hyperdense vessel is evident. There is calcification in each carotid siphon region. There is also calcification in the distal right vertebral artery. Skull: The bony calvarium appears intact. Sinuses/Orbits: There is mild mucosal thickening in several ethmoid air cells bilaterally. Other visualized paranasal sinuses are clear. Orbits appear symmetric bilaterally. Other: There is evidence of previous mastoid surgery on the right. Mastoids on the left are clear. A few remaining mastoids on the right are opacified. IMPRESSION: Decreased attenuation is noted in the right pons region. An early infarct in this area must be of concern given this appearance. There is decreased attenuation hand posterior right centrum semiovale which is probably due to small vessel disease. This small vessel disease is age uncertain. Gray-white compartments elsewhere appear unremarkable. No hemorrhage or mass effect. Foci of vascular calcification noted. There is mild mucosal thickening in several ethmoid air cells. Postoperative change noted in the mastoid region on the right. Electronically Signed   By: Bretta BangWilliam  Woodruff III M.D.   On: 09/01/2016 13:33   Dg Chest Portable 1 View  Result Date: 09/01/2016 CLINICAL DATA:  Anterior chest pain today associated with shortness of breath. History of COPD, former smoker. EXAM: PORTABLE CHEST 1 VIEW  COMPARISON:  Chest x-ray of July 21, 2016 FINDINGS: The lungs remain hyperinflated. There is no focal infiltrate. There is no pleural effusion or pneumothorax. The heart and pulmonary vascularity are normal. The permanent pacemaker is in stable position. There is calcification in the wall of the aortic arch. The mediastinum is normal in width. There is mild dextrocurvature centered in the upper thoracic spine which is in part positional. IMPRESSION: COPD.  No acute cardiopulmonary abnormality. Aortic atherosclerosis. Electronically Signed   By: David  Swazil[place] M.D.   On: 09/01/2016 10:58    Procedures .Critical Care Performed by: Jacalyn LefevreHAVILAND, Embry Manrique Authorized by: Jacalyn LefevreHAVILAND, Alysabeth Scalia   Critical care provider statement:    Critical care time (minutes):  30   Critical care time was exclusive of:  Separately billable procedures and treating other patients and teaching time   Critical care was necessary to treat or prevent imminent or life-threatening deterioration of the following conditions:  Respiratory failure   Critical care was time spent personally by me on the following activities:  Development of treatment plan with patient or surrogate, discussions with consultants, evaluation of patient's response to treatment, examination of patient, ordering and performing treatments and interventions, ordering and review of laboratory studies, ordering and review of radiographic studies, pulse oximetry, re-evaluation of patient's condition and review of old charts   (including critical care time)  Medications Ordered in ED Medications  nitroGLYCERIN (NITROSTAT) SL tablet 0.4 mg (0.4 mg Sublingual Given 09/01/16 1027)  methylPREDNISolone sodium succinate (SOLU-MEDROL) 125 mg/2 mL injection 125 mg (125 mg Intravenous Given 09/01/16 1027)  ipratropium-albuterol (DUONEB) 0.5-2.5 (3) MG/3ML nebulizer solution 3 mL (3 mLs Nebulization Given 09/01/16 1027)  aspirin EC tablet 325 mg (325 mg Oral Given 09/01/16 1037)    acetaminophen (TYLENOL) tablet 1,000 mg (1,000 mg Oral Given 09/01/16 1037)  morphine 4 MG/ML injection 4 mg (4  mg Intravenous Given 09/01/16 1311)  sodium chloride 0.9 % bolus 1,000 mL (1,000 mLs Intravenous New Bag/Given 09/01/16 1144)  albuterol (PROVENTIL,VENTOLIN) solution continuous neb (10 mg/hr Nebulization Given 09/01/16 1302)     Initial Impression / Assessment and Plan / ED Course  I have reviewed the triage vital signs and the nursing notes.  Pertinent labs & imaging results that were available during my care of the patient were reviewed by me and considered in my medical decision making (see chart for details).  Clinical Course    When pt returned from CT, his SOB worsened.  I ordered a continuous neb for pt.  Continuous neb helped, but pt is still very sob.  He was placed on BIPAP which has helped.  Abnormality on head CT noted.   Pt clinically, does not have any neurologic deficits, just a headache.  Pt may need a MRI once his breathing has stabilized and he can lay flat.  Pt d/w IM residents for admission.   Final Clinical Impressions(s) / ED Diagnoses   Final diagnoses:  COPD exacerbation (HCC)  Chest pain, unspecified type  Lesion of pons  Acute on chronic respiratory failure with hypoxia [facility])    New Prescriptions New Prescriptions   No medications on file     Jacalyn Lefevre, MD 09/01/16 1428

## 2016-09-01 NOTE — H&P (Signed)
Date: 09/01/2016               Patient Name:  Leonard Massey MRN: [id number]  DOB: [birth year] Age / Sex: 70 y.o., male   PCP: Leonard RungErik C Hoffman, DO         Medical Service: Internal Medicine Teaching Service         Attending Physician: Dr. Gust RungErik C Hoffman, DO    First Contact: Leonard Massey Pager: [phone]  Second Contact: Leonard Massey Pager: [phone]       After Hours (After 5p/  First Contact Pager: [phone]  weekends / holidays): Second Contact Pager: [phone]   Chief Complaint: Shortness of breath  History of Present Illness: Mr. Leonard Massey is a 70yo male with PMH of COPD, CHF, HLD, HTN, Chronic Pain Syndrome, CAD, A fib s/p ablation and biventricular pacemaker, MDD presenting to the ED with worsening shortness of breath and chest pain for 3 days. He states he noticed he was feeling a little short of breath on Saturday which has progressively gotten worse since then with development of bilateral lower rib pain that is worse when he is working harder to breathe. He endorses some congestions but denies post-nasal drip. He endorses stable cough with sputum change from white to yellow; denies hemoptysis. He states that he has been consistent with taking his medicines and inhalers. He denies abd pain, nausea, vomiting, urinary symptoms. He endorses headache that has been intermittent in the last 6 months with no changes in vision, hearing, sensory, focal weakness, dysarthria.   In ED, patient's shortness of breath worsened after CT head. He was placed on continuous neb but was not completely resolved. He was placed on BiPAP with improvement of symptoms.   Meds:  Current Meds  Medication Sig  . albuterol (PROAIR HFA) 108 (90 BASE) MCG/ACT inhaler Inhale 2 puffs into the lungs every 6 (six) hours as needed for wheezing or shortness of breath.   . ALPRAZolam (XANAX) 0.25 MG tablet Take 0.25 mg by mouth 3 times daily  . aspirin (ASPIRIN EC) 81 MG EC tablet Take 81 mg by mouth daily. Swallow whole.  .  Aspirin-Acetaminophen-Caffeine (GOODY HEADACHE PO) Take 1 packet by mouth daily as needed (headache).  . budesonide (PULMICORT) 0.5 MG/2ML nebulizer solution INHALE 1 VIAL VIA NEBULIZER TWICE DAILY  . cyclobenzaprine (FLEXERIL) 5 MG tablet Take 1 tablet (5 mg total) by mouth 3 (three) times daily as needed for muscle spasms.  [name]. ELIQUIS 5 MG TABS tablet TAKE 1 TABLET(5 MG) BY MOUTH TWICE DAILY  . HYDROcodone-acetaminophen (NORCO) 10-325 MG tablet Take 1 tablet by mouth every 8 (eight) hours as needed for severe pain.  [name]. ipratropium-albuterol (DUONEB) 0.5-2.5 (3) MG/3ML SOLN Inhale 3 mLs into the lungs 4 (four) times daily.   [name]. levalbuterol (XOPENEX HFA) 45 MCG/ACT inhaler INHALE 2 PUFFS INTO THE LUNGS EVERY 4 HOURS AS NEEDED FOR WHEEZING  . levalbuterol (XOPENEX) 0.63 MG/3ML nebulizer solution Take 3 mLs (0.63 mg total) by nebulization every 2 (two) hours as needed for wheezing or shortness of breath.  . metoprolol succinate (TOPROL XL) 100 MG 24 hr tablet Take 1 tablet (100 mg total) by mouth daily. Take with or immediately following a meal.  . pantoprazole (PROTONIX) 20 MG tablet Take 1 tablet (20 mg total) by mouth daily.  . potassium chloride SA (K-DUR,KLOR-CON) 20 MEQ tablet Take 1 tablet by mouth as directed. Take on the days you take Lasix  . sertraline (ZOLOFT) 50 MG tablet Take  2 tablets (100 mg total) by mouth daily.  . simvastatin (ZOCOR) 40 MG tablet TAKE 1 TABLET(40 MG) BY MOUTH AT BEDTIME  . SYMBICORT 160-4.5 MCG/ACT inhaler INHALE 2 PUFFS INTO THE LUNGS TWICE DAILY     Allergies: Allergies as of 09/01/2016 - Review Complete 09/01/2016  Allergen Reaction Noted  . Aspirin Other (See Comments) 05/03/2007  . Lasix [furosemide] Other (See Comments) 08/22/2015   Past Medical History:  Diagnosis Date  . Alcoholism (HCC)   . Cervical spondylosis   . Chronic diastolic CHF (congestive heart failure) (HCC)   . Complete heart block (HCC)    a. h/o permanent atrial fibrillation s/p [employer] BiV pacemaker implanted by Dr. Bard Massey at [facility] in 12/2014 with concomitant AVN ablation.  [name] COPD (chronic obstructive pulmonary disease) (HCC)   . Coronary artery calcification seen on CT scan 02/2015   a. Lexiscan nuclear stress testing 08/01/15 which was low risk, no reversible ischemia, EF 56%..  . Depression   . Essential hypertension   . GERD (gastroesophageal reflux disease)    Pt reports having EGD/colonoscopy in 2010 by Dr. Chales Massey in [facility] wich showed ulcers on uppper and normal colon. No report in emr.   . Hyperlipidemia    Pt typically has low HDL and low LDL.   [name] Low back pain   . Permanent atrial fibrillation (HCC)   . PVD (peripheral vascular disease) (HCC)    a. followed by vascular surgery (prior carotid subclavian bypass for subclavian steal ~03, bilateral aortoiliac and superficial femoral occlusive disease with calf claudication being managed medically.  . Right knee pain 8/10   Began to complain of after requesting a scooter.  Exam was negative and it was decided that pt should continue to ambulate.   . Subclavian steal syndrome    HX of.   . Tobacco abuse     Family History: Mother had heart failure and MI  Social History: Former 2-3 ppd smoker, quit in June with Bupropion. Former alcohol abuse in remission. Denies illicit drug use.   Review of Systems: A complete ROS was negative except as per HPI.   Physical Exam: Blood pressure 127/84, pulse 70, temperature 97.9 F (36.6 C), temperature source Oral, resp. rate 23, SpO2 100 %. Constitutional: NAD, cachectic, appears older than stated age CV: distant heart sounds, no murmurs, rubs or gallops appreciated, pulses regular and intact throughout, no LE edema, no cyanosis or clubbing Resp: mild increased work of breathing with some accessory muscle use but able to use complete sentences. Moves air in all lung regions, decreased breath sounds throughout, no wheezing or crackles appreciated. Abd: soft, +BS,  NDNT Ext: diffuse muscle atrophy, moves all 4 extremities freely Neuro: CN 2-12 intact, strength and sensation intact, alert and oriented x3  LABS: BNP 287 Na 134, K 5.0, Cl 95, CO2 21, BUN 25, Cr 0.84, Glu 178, anion gap 18 WBC 12.3, Hg 17.2, Hct 51.3, Plt 236 ABG 7.344/42.8/105/23.4/98%  EKG: Ventricular paced rhythm, no ST interval or T wave changes  CXR: Hyperinflated lungs, no acute cardiopulmonary abnormality  CT head wo contrast: Decreased attenuation in right pons region suggestive of early infarct. Decreased attenuation in posterior right centrum semiovale noted to be probably due to small vessel disease; age uncertain.   Assessment & Plan by Problem: Active Problems:   COPD exacerbation (HCC)  COPD exacerbation:  Patient with severe COPD on intermittent oxygen at home presenting with 3 days of worsening shortness of breath. He was last discharged from  hospital in September for COPD exacerbation treated with Augmentin. Sputum culture at that time was + for pansensitive Pseudomonas aeruginosa but patient was improving with augmentin only so change in antibiotics was not made. He does not necessarily endorse increase in sputum or purulence, but he is >65yo with dyspnea and recent antibiotic treated COPD exacerbation so treatment with antibiotics of different class is warranted. At home on xopenex, albuterol, duoneb and symbicort.  --Levoquin 750mg  daily to cover P. Aeruginosa --Prednisone 40mg  daily --pulmicort BID, duoneb q4hr --Off BiPAP, O2 as needed to keep O2 sat 91-94% - not to be above 95%  Chest pain:  Patient with chest pain worsened with increased work of breathing located in bilateral lower ribs. No chest pain on interview and no reproducible pain. Likely musculoskeletal from increased work of breathing. Troponin 0.03. EKG unremarkable from past - no appreciable ischemic changes. Stress test in 07/2015 - low risk, no reversible ischemia; fixed septal defect attributed  to pacing artifact. 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 He has a history of Left endarterectomy. CT angio of neck in 11/2015 shows patent left flow, and no evidence of right sided carotid stenosis or occlusion. --optimize BP --Hgb A1c --ECHO --continue simvastatin; consider change in atorvastatin --lipid panel --ASA 81mg  daily  A fib s/p ablation and biventricular pacemaker:  Patient in regular rhythm. On metoprolol succinate 100mg  daily, Eliquis 5mg  daily --continue metoprolol 100mg  daily  --Eliquis 5mg  daily  Chronic pain:  --continue home flexeril TID PRN --Acetaminophen PRN --Takes Norco 10mg  TID at home; received morphine today, will consider starting back tomorrow PRN  Cachexia:  Patient with unintentional weight loss; 5lb loss since last admission. Likely combination of depression and progressive COPD. Workup has been negative for source of weight loss. --continue ensure  Depression: --continue Zoloft 100mg  daily  Diet: HH IVF: none DVT prophylaxis: Eliquis CODE: DNR  Dispo: Admit patient to Inpatient with expected length of stay greater than 2 midnights.  Signed: Nyra MarketGorica Sylver Vantassell, MD 09/01/2016, 2:38 PM  Pager [phone]

## 2016-09-01 NOTE — ED Notes (Signed)
Pt.on monitor and in gown.

## 2016-09-01 NOTE — ED Notes (Signed)
Attempted to call report RN unavailable at this time will call back

## 2016-09-01 NOTE — ED Notes (Signed)
Informed pt we need a urine sample, pt unable to give a sample at this time.

## 2016-09-01 NOTE — ED Notes (Signed)
Pt transported to CT ?

## 2016-09-01 NOTE — ED Notes (Addendum)
Spoke with Admit team they are going to switch the bed to a med surg/tele instead of a step down due to his being able to tolerate being off the bipap for two hours  and are aware of the pt's troponin

## 2016-09-02 DIAGNOSIS — R930 Abnormal findings on diagnostic imaging of skull and head, not elsewhere classified: Secondary | ICD-10-CM

## 2016-09-02 DIAGNOSIS — I639 Cerebral infarction, unspecified: Secondary | ICD-10-CM

## 2016-09-02 LAB — BASIC METABOLIC PANEL
ANION GAP: 9 (ref 5–15)
BUN: 26 mg/dL — ABNORMAL HIGH (ref 6–20)
CALCIUM: 9.3 mg/dL (ref 8.9–10.3)
CO2: 29 mmol/L (ref 22–32)
Chloride: 102 mmol/L (ref 101–111)
Creatinine, Ser: 0.55 mg/dL — ABNORMAL LOW (ref 0.61–1.24)
GLUCOSE: 97 mg/dL (ref 65–99)
Potassium: 4.2 mmol/L (ref 3.5–5.1)
Sodium: 140 mmol/L (ref 135–145)

## 2016-09-02 LAB — LIPID PANEL
CHOL/HDL RATIO: 2.9 ratio
CHOLESTEROL: 103 mg/dL (ref 0–200)
HDL: 36 mg/dL — AB (ref >40)
LDL CALC: 54 mg/dL (ref 0–99)
TRIGLYCERIDES: 65 mg/dL (ref <150)
VLDL: 13 mg/dL (ref 0–40)

## 2016-09-02 LAB — CBC
HCT: 38.9 % — ABNORMAL LOW (ref 39.0–52.0)
HEMOGLOBIN: 13 g/dL (ref 13.0–17.0)
MCH: 30.6 pg (ref 26.0–34.0)
MCHC: 33.4 g/dL (ref 30.0–36.0)
MCV: 91.5 fL (ref 78.0–100.0)
Platelets: 182 10*3/uL (ref 150–400)
RBC: 4.25 MIL/uL (ref 4.22–5.81)
RDW: 13.5 % (ref 11.5–15.5)
WBC: 7.8 10*3/uL (ref 4.0–10.5)

## 2016-09-02 LAB — HEMOGLOBIN A1C
Hgb A1c MFr Bld: 5.9 % — ABNORMAL HIGH (ref 4.8–5.6)
Mean Plasma Glucose: 123 mg/dL

## 2016-09-02 MED ORDER — ORAL CARE MOUTH RINSE
15.0000 mL | Freq: Two times a day (BID) | OROMUCOSAL | Status: DC
  Administered 2016-09-02 – 2016-09-04 (×5): 15 mL via OROMUCOSAL

## 2016-09-02 MED ORDER — IPRATROPIUM-ALBUTEROL 0.5-2.5 (3) MG/3ML IN SOLN
3.0000 mL | Freq: Four times a day (QID) | RESPIRATORY_TRACT | Status: DC
  Administered 2016-09-02 – 2016-09-04 (×10): 3 mL via RESPIRATORY_TRACT
  Filled 2016-09-02 (×11): qty 3

## 2016-09-02 MED ORDER — LEVOFLOXACIN 750 MG PO TABS
750.0000 mg | ORAL_TABLET | ORAL | Status: DC
  Administered 2016-09-02 – 2016-09-03 (×2): 750 mg via ORAL
  Filled 2016-09-02 (×2): qty 1

## 2016-09-02 MED ORDER — KETOROLAC TROMETHAMINE 15 MG/ML IJ SOLN
15.0000 mg | Freq: Once | INTRAMUSCULAR | Status: AC
  Administered 2016-09-02: 15 mg via INTRAVENOUS
  Filled 2016-09-02: qty 1

## 2016-09-02 MED ORDER — ENSURE ENLIVE PO LIQD
237.0000 mL | Freq: Three times a day (TID) | ORAL | Status: DC
  Administered 2016-09-02 – 2016-09-04 (×7): 237 mL via ORAL

## 2016-09-02 MED ORDER — CHLORHEXIDINE GLUCONATE 0.12 % MT SOLN
15.0000 mL | Freq: Two times a day (BID) | OROMUCOSAL | Status: DC
  Administered 2016-09-02 – 2016-09-04 (×5): 15 mL via OROMUCOSAL
  Filled 2016-09-02 (×4): qty 15

## 2016-09-02 MED ORDER — MORPHINE SULFATE (CONCENTRATE) 10 MG/0.5ML PO SOLN
5.0000 mg | ORAL | Status: DC | PRN
  Administered 2016-09-03: 5 mg via ORAL
  Filled 2016-09-02: qty 0.5

## 2016-09-02 MED ORDER — MORPHINE SULFATE (CONCENTRATE) 10 MG/0.5ML PO SOLN: 5.0000 mg | ORAL | Status: DC | PRN

## 2016-09-02 NOTE — Progress Notes (Signed)
Internal Medicine Attending:   I saw and examined the patient. I reviewed the resident's note and I agree with the resident's findings and plan as documented in the resident's note. I had a long conversation with Leonard Massey and his sister Leonard Massey, it seems that he is getting very dyspneic with minimal activity, even eating his meals.  He has had trouble with his portable oxygen as the tank weighs too much for him to carry around.  He has not been eating well and has lost weight due to his SOB.  He is declining and he reports that he is no longer able to do many of the things he finds enjoyment from.  He used to enjoy fishing but has not been able to do that in many years.  His long term girlfriend has been in a SNF for the last several weeks to months and thus has little social interaction besides his sister.  He is currently using oxygen all the time even before this latest exacerbation.  I discussed the possibility of engaging with palliative care to discuss additional goals of care and they are both very open to the idea.  Otherwise I agree with Dr Kandice HamsSvalina's management of his COPD exacerbation and abnormal CT finding.  As far as his headache goes, It is possible this is caused by his cervical disease, medication overuse, or hypoxia, I favor the latter given over the last few years we have had great improvement of his neck pain after surgery and with occasional pain medications.  The headaches seems to come most frequently when he becomes more short of breath.

## 2016-09-02 NOTE — Progress Notes (Signed)
Report received from Devota PaceJaquetta Thomas RN at 2332. Patient in bed sleeping. Call bed within reach. Bed alarm on. Will continue to monitor. Petra Kubarica Asiel Chrostowski RN 09/02/2016 11:54 PM

## 2016-09-02 NOTE — Progress Notes (Signed)
Patient admitted from ED in first part of shift.  When first arrived on the unit patient was quite dyspneic although sats were 100% on 3 liters.  Xanax and Flexeril given as well as respiratory intervention with nebs and patient was able to lay down in the bed and slept the majority of the rest of the shift.

## 2016-09-02 NOTE — Progress Notes (Signed)
Initial Nutrition Assessment  DOCUMENTATION CODES:   Severe malnutrition in context of chronic illness, Underweight  INTERVENTION:  Ensure Enlive po QID, each supplement provides 350 kcal and 20 grams of protein Provide snacks BID Provide Magic Cup daily with dinner trays, each supplement provides 290 kcal and 9 grams of protein Provided and discussed "Suggestions for Increasing Calories and Protein" handout from the Academy of Nutrition and Dietetics  NUTRITION DIAGNOSIS:   Malnutrition related to chronic illness, poor appetite as evidenced by severe depletion of muscle mass, percent weight loss.   GOAL:   Patient will meet greater than or equal to 90% of their needs   MONITOR:   PO intake, Supplement acceptance, Labs, Weight trends, Skin, I & O's  REASON FOR ASSESSMENT:   Malnutrition Screening Tool    ASSESSMENT:   70 y.o. male with PMH of COPD, CHF, HLD, HTN, alcoholism, cervical spondylosis, chronic pain syndrome, chronic headache, CAD, Afib s/p ablation and biv pacemaker, who presented yesterday with SOB and chest pain for 3 days.  Pt states that he has had a decreased appetite for a few years, but he also reports losing weight despite eating well. He reports eatings eggs with grits and bacon or a bowl of corn flakes with banana for breakfast; for lunch he eats fish, macaroni and cheese or rice, and veggies; for dinner he usually eats a sandwich. He states that he has also been drinking Ensure Enlive 4 to 5 times daily. Weight history shows pt has lost 8% of his body weight within the past 2 months. He has severe muscle wasting and moderate fat wasting per nutrition-focused physical exam.  Upon further investigation pt reports that he often skips meals, but was unable to expand. RD discussed the importance of adequate nutrition and weight maintenance. Provided and discussed "Suggestions for Increasing Calories and Protein" handout from the Academy of Nutrition and Dietetics.    Labs: low HDL, elevated BUN, low creatinine; hemoglobin A1c pending  Diet Order:  Diet Heart Room service appropriate? Yes; Fluid consistency: Thin  Skin:  Reviewed, no issues  Last BM:  11/5  Height:   Ht Readings from Last 1 Encounters:  09/01/16 5\' 6"  (1.676 m)    Weight:   Wt Readings from Last 1 Encounters:  09/01/16 104 lb 12.8 oz (47.5 kg)    Ideal Body Weight:  65.5 kg  BMI:  Body mass index is 16.92 kg/m.  Estimated Nutritional Needs:   Kcal:  1700-1900  Protein:  70-80 grams  Fluid:  1.7 L/day  EDUCATION NEEDS:   Education needs addressed  Dorothea Ogleeanne Yovanny Coats RD, CSP, LDN Inpatient Clinical Dietitian Pager: [phone] After Hours Pager: [phone]

## 2016-09-02 NOTE — Progress Notes (Signed)
Subjective:  Patient sleeping comfortably, but awakens easily. Patient states he still has headaches and again denies any neurological changes while inpatient or in the days preceding.   We spoke at length with sister. She states he gets dyspneic with eating, even fluids, so he has decreased his PO intake. He is home bound due to his symptoms.   Patient states he has been home bound for about a year. He states he would like to go back to fishing like he has enjoyed previously. His hope is that his symptoms are controlled at home. Patient again states wishes for DNR for code status.  Objective:  Vital signs in last 24 hours: Vitals:   09/02/16 0200 09/02/16 0704 09/02/16 0758 09/02/16 1149  BP:  118/84 119/72 136/87  Pulse:  70 73 74  Resp:  18 18 20   Temp:  98.3 F (36.8 C) 97.7 F (36.5 C) 98.7 F (37.1 C)  TempSrc:  Oral Oral Oral  SpO2: 98% 97% 98% 100%  Weight:      Height:       Constitutional: Mild air hunger with position change, able to speak few words at a time CV: RRR, no murmurs, rubs or gallops appreciated, no LE edema, pulses intact, warm extremities Resp: diffuse expiratory wheezing, pursed lips with expirations, no crackles appreciated Abd: soft, +BS, NDNT Neuro: CN 2-12 intact, strength intact  Assessment/Plan:  Principal Problem:   COPD exacerbation (HCC) Active Problems:   Essential hypertension   Loss of weight   Headache   Protein-calorie malnutrition, severe (HCC)  COPD exacerbation:  Patient with end stage COPD with exacerbation and likely some progression of his disease. He has been progressively losing weight in the last year or so, decreasing his PO intake due to dyspnea, been home bound. In hospital has had minimal PO intake due to dyspnea.  --Levoquin 750mg  daily to cover P. Aeruginosa --Prednisone 40mg  daily --pulmicort BID, duoneb q4hr --Off BiPAP, O2 as needed to keep O2 sat 91-94% - not to be above 95% --PO morphine 5mg  q3hr PRN for air  hunger --Consulted palliative - appreciate their recommendations  Chest pain:  No chest pain today. EKG unremarkable. Troponins negative x2. Likely etiology is musculoskeletal due to increased work of breathing 2/2 COPD exacerbation. 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 He has a history of Left endarterectomy. CT angio of neck in 11/2015 shows patent left flow, and no evidence of right sided carotid stenosis or occlusion; nonocclusive R vertebral artery stenosis. --Hgb A1c --ECHO --continue simvastatin; consider change in atorvastatin --lipid panel - LDL <70 --ASA 81mg  daily --consulted neuro - appreciate their recommendations  Headaches:  Patient with headaches for past 6 months, tension type. Etiology could be hypoxia 2/2 severe COPD vs tension from chronic neck pain. CT unremarkable for cause.  A fib s/p ablation and biventricular pacemaker:  Patient in regular rhythm. On metoprolol succinate 100mg  daily, Eliquis 5mg  daily --continue metoprolol 100mg  daily  --Eliquis 5mg  daily  Chronic pain:  --continue home flexeril TID PRN --Acetaminophen PRN --Takes Norco 10mg  TID at home; adding PO morphine PRN  Cachexia:  Patient with unintentional weight loss; 5lb loss since last admission. Likely combination of depression and progressive COPD. Workup has been negative for source of weight loss. --continue ensure   Depression: --continue Zoloft 100mg  daily  Dispo: Anticipated discharge in approximately 2-3 day(s).  Nyra MarketGorica Ladarrell Cornwall, MD 09/02/2016, 2:25 PM Pager [phone]

## 2016-09-02 NOTE — Consult Note (Signed)
Neurology Consultation Reason for Consult: abnormal finding on CT head.  Referring Physician: Dr. Mikey BussingHoffman.   CC: chronic headache, SOB  History is obtained from:chart and patient.  HPI: Leonard Massey is a 70 y.o. male with PMH of COPD, CHF, HLD, HTN, alcoholism, cervical spondylosis, chronic pain syndrome, chronic headache, CAD, Afib s/p ablation and biv pacemaker, who presented yesterday with SOB and chest pain for 3 days. He has chronic headache, states this has been going on sine he was about 70 years of age. He is unsure if he ever saw a neurologist. Has tried taking asa and tylenol in the past but that does not usually help. Oxycodone which he takes for his OA pain seems to help the HA somewhat. HA starts on the back of his head/neck area and radiates bifrontally and lasts usually 3 days. No aura, no other neuro symptoms with the HA. He takes oxycodone TID. He has spinal stenosis of Cspine, s/p  Surgery.   He denies any weakness, numbness, tingling, vision changes.    Review of Systems  Constitutional: Negative for chills and fever.  HENT: Negative for ear pain and hearing loss.   Respiratory: Positive for shortness of breath. Negative for cough.   Cardiovascular: Negative for chest pain.  Gastrointestinal: Negative for heartburn and nausea.  Musculoskeletal: Positive for joint pain and neck pain.  Neurological: Negative for dizziness, sensory change, speech change, focal weakness and headaches.  Psychiatric/Behavioral: Negative for depression and memory loss.     Past Medical History:  Diagnosis Date  . Alcoholism (HCC)   . Cervical spondylosis   . Chronic diastolic CHF (congestive heart failure) (HCC)   . Complete heart block (HCC)    a. h/o permanent atrial fibrillation s/p St. Jude BiV pacemaker [facility] in 12/2014 with concomitant AVN ablation.  [name]. COPD (chronic obstructive pulmonary disease) (HCC)   . Coronary artery calcification seen on CT scan  02/2015   a. Lexiscan nuclear stress testing 08/01/15 which was low risk, no reversible ischemia, EF 56%..  . Depression   . Essential hypertension   . GERD (gastroesophageal reflux disease)    Pt reports having EGD/colonoscopy in 2010 by [place] showed ulcers on uppper and normal colon. No report in emr.   . Hyperlipidemia    Pt typically has low HDL and low LDL.   [name]. Low back pain   . Permanent atrial fibrillation (HCC)   . PVD (peripheral vascular disease) (HCC)    a. followed by vascular surgery (prior carotid subclavian bypass for subclavian steal ~03, bilateral aortoiliac and superficial femoral occlusive disease with calf claudication being managed medically.  . Right knee pain 8/10   Began to complain of after requesting a scooter.  Exam was negative and it was decided that pt should continue to ambulate.   . Subclavian steal syndrome    HX of.   . Tobacco abuse     Family History  Problem Relation Age of Onset  . Heart failure Mother   . Heart attack Mother   . Hypertension Sister   . Heart attack Sister   . Stroke Neg Hx     Social History:  reports that he quit smoking about 5 months ago. His smoking use included Cigarettes. He started smoking about 3 years ago. He has a 25.00 pack-year smoking history. He quit smokeless tobacco use about 4 years ago. He reports that he does not drink alcohol or use drugs.  Exam:  Current vital signs: BP (!) 135/91 (BP Location: Left Arm)   Pulse 74   Temp 98.7 F (37.1 C) (Oral)   Resp (!) 22   Ht 5\' 6"  (1.676 m)   Wt 104 lb 12.8 oz (47.5 kg) Comment: Scale B  SpO2 100%   BMI 16.92 kg/m  Vital signs in last 24 hours: Temp:  [97.7 F (36.5 C)-98.8 F (37.1 C)] 98.7 F (37.1 C) (11/07 1149) Pulse Rate:  [68-79] 74 (11/07 1528) Resp:  [17-29] 22 (11/07 1528) BP: (109-184)/(55-104) 135/91 (11/07 1528) SpO2:  [92 %-100 %] 100 % (11/07 1528) FiO2 (%):  [28 %] 28 % (11/06 1615) Weight:  [104 lb 12.8 oz (47.5  kg)] 104 lb 12.8 oz (47.5 kg) (11/06 2057)   Physical Exam  Constitutional: thin, somewhat frail, elderly man Psych: Affect appropriate to situation Eyes: No scleral injection HENT: No OP obstrucion Head: Normocephalic.  Cardiovascular: Normal rate and regular rhythm.  Respiratory: Effort normal, exp wheezing.  GI: Soft.  No distension. There is no tenderness.  Skin: WDI Neck - has reproducible HA to palpation over the occipital notch. Neuro: Mental Status: Patient is awake, alert, oriented to person, place, month, year, and situation.  Patient is able to give a clear and coherent history. No signs of aphasia or neglect Cranial Nerves: II: Visual Fields are full. Pupils are equal, round, and reactive to light.  III,IV, VI: EOMI without ptosis or diploplia.  V: Facial sensation is symmetric to temperature VII: Facial movement is symmetric.  VIII: hearing is intact to voice X: Uvula elevates symmetrically XI: Shoulder shrug is symmetric. XII: tongue is midline without atrophy or fasciculations.  Motor: Tone is normal. Bulk is normal. 5/5 strength was present in all four extremities.  Tremor noted. Sensory: Sensation is symmetric to light touch and temperature in the arms and legs. Deep Tendon Reflexes: 1+ and symmetric in the biceps and patellae.  Plantars: Toes are downgoing bilaterally.  Cerebellar: FNF and HKS are intact bilaterally      I have reviewed labs in epic and the results pertinent to this consultation are: BMET normal. Trop neg, BNP 287.   I have reviewed the images obtained:  CT head showing decreased attenuation on right pons, early infarct was the concern. However, it seems to be crossing midline on different imaging slice.   Impression:   Patient has no neurological changes on exam to suggest a CVA. CT finding is likely an artifact or white matter disease. It crosses midline from right pon to left. MRI would be helpful but we will not able able to  get with his pacemaker.   His chronic headache could be combination of occipital neuralgia along with medication overuse headache. Has been taking oxycodone regularly. Also could be from chronic hypoxia.   Recommendations: 1) Repeat CT head in AM to see if there is any change.  2) would defer further CVA workup for now. 3) Could consider nerve block as outpatient for possible occipital neuralgia and slowly taper him from oxycodone if possible. Could also consider lyrica or gabapentin. 4) f/up neurology outpatient for HA.  Hyacinth Meekerasrif Council Munguia, M.D., PGY-3.

## 2016-09-03 ENCOUNTER — Telehealth: Payer: Self-pay | Admitting: *Deleted

## 2016-09-03 ENCOUNTER — Inpatient Hospital Stay (HOSPITAL_COMMUNITY): Payer: Medicare Other

## 2016-09-03 DIAGNOSIS — J441 Chronic obstructive pulmonary disease with (acute) exacerbation: Principal | ICD-10-CM

## 2016-09-03 DIAGNOSIS — F329 Major depressive disorder, single episode, unspecified: Secondary | ICD-10-CM

## 2016-09-03 DIAGNOSIS — I4891 Unspecified atrial fibrillation: Secondary | ICD-10-CM

## 2016-09-03 DIAGNOSIS — R51 Headache: Secondary | ICD-10-CM

## 2016-09-03 DIAGNOSIS — J9621 Acute and chronic respiratory failure with hypoxia: Secondary | ICD-10-CM

## 2016-09-03 DIAGNOSIS — Z7901 Long term (current) use of anticoagulants: Secondary | ICD-10-CM

## 2016-09-03 DIAGNOSIS — Z79899 Other long term (current) drug therapy: Secondary | ICD-10-CM

## 2016-09-03 DIAGNOSIS — G894 Chronic pain syndrome: Secondary | ICD-10-CM

## 2016-09-03 DIAGNOSIS — Z950 Presence of cardiac pacemaker: Secondary | ICD-10-CM

## 2016-09-03 DIAGNOSIS — Z66 Do not resuscitate: Secondary | ICD-10-CM

## 2016-09-03 DIAGNOSIS — E43 Unspecified severe protein-calorie malnutrition: Secondary | ICD-10-CM

## 2016-09-03 LAB — CBC
HCT: 38.7 % — ABNORMAL LOW (ref 39.0–52.0)
Hemoglobin: 12.6 g/dL — ABNORMAL LOW (ref 13.0–17.0)
MCH: 30.4 pg (ref 26.0–34.0)
MCHC: 32.6 g/dL (ref 30.0–36.0)
MCV: 93.5 fL (ref 78.0–100.0)
Platelets: 174 10*3/uL (ref 150–400)
RBC: 4.14 MIL/uL — ABNORMAL LOW (ref 4.22–5.81)
RDW: 13.7 % (ref 11.5–15.5)
WBC: 7.8 10*3/uL (ref 4.0–10.5)

## 2016-09-03 LAB — COMPREHENSIVE METABOLIC PANEL
ALK PHOS: 51 U/L (ref 38–126)
ALT: 27 U/L (ref 17–63)
AST: 38 U/L (ref 15–41)
Albumin: 3.6 g/dL (ref 3.5–5.0)
Anion gap: 11 (ref 5–15)
BILIRUBIN TOTAL: 0.4 mg/dL (ref 0.3–1.2)
BUN: 29 mg/dL — AB (ref 6–20)
CALCIUM: 9.3 mg/dL (ref 8.9–10.3)
CHLORIDE: 101 mmol/L (ref 101–111)
CO2: 29 mmol/L (ref 22–32)
CREATININE: 0.6 mg/dL — AB (ref 0.61–1.24)
GFR calc Af Amer: >60 mL/min (ref >60)
Glucose, Bld: 109 mg/dL — ABNORMAL HIGH (ref 65–99)
Potassium: 4.2 mmol/L (ref 3.5–5.1)
Sodium: 141 mmol/L (ref 135–145)
TOTAL PROTEIN: 5.4 g/dL — AB (ref 6.5–8.1)

## 2016-09-03 MED ORDER — MORPHINE SULFATE (CONCENTRATE) 10 MG/0.5ML PO SOLN
5.0000 mg | ORAL | Status: DC | PRN
  Administered 2016-09-03 – 2016-09-04 (×5): 5 mg via ORAL
  Filled 2016-09-03 (×5): qty 0.5

## 2016-09-03 MED ORDER — IBUPROFEN 600 MG PO TABS
800.0000 mg | ORAL_TABLET | Freq: Three times a day (TID) | ORAL | Status: DC
  Administered 2016-09-03 – 2016-09-04 (×4): 800 mg via ORAL
  Filled 2016-09-03 (×4): qty 1

## 2016-09-03 MED ORDER — GABAPENTIN 300 MG PO CAPS
300.0000 mg | ORAL_CAPSULE | Freq: Three times a day (TID) | ORAL | Status: DC
  Administered 2016-09-03 – 2016-09-04 (×4): 300 mg via ORAL
  Filled 2016-09-03 (×4): qty 1

## 2016-09-03 MED ORDER — ACETAMINOPHEN-CODEINE #3 300-30 MG PO TABS: 1.0000 | ORAL_TABLET | Freq: Four times a day (QID) | ORAL | Status: DC | PRN

## 2016-09-03 NOTE — Progress Notes (Signed)
Recv'd order to discontinue cardiac monitoring.  Telemetry has been removed, CCMD has been notified.

## 2016-09-03 NOTE — Progress Notes (Signed)
   Introduced Publishing rights managerchaplaincy services to pt. & family (ex-wife).   Per request, provided prayer.  Will follow, as needed.   - Rev. Chaplain Kipp Broodnthony Hazely Sealey MDiv ThM

## 2016-09-03 NOTE — Care Management Note (Signed)
Case Management Note  Patient Details  Name: Leonard LeekJohn W Massey MRN: [id number] Date of Birth: [birth year]  Subjective/Objective:        Admitted with COPD            Action/Plan: Patient is to go home with Hospice care; Home Hospice choice offered, [facility]; Patient also stated that his daughter Drinda Buttsnnette is HCPOA; Pt / sister gave CM permission to call Drinda Buttsnnette; TCT Drinda Buttsnnette [phone] - she is in agreement with the Home Hospice choice. Referral made to hospice/ clinical information faxed to [phone]  Expected Discharge Date:    possibly 09/04/2016              Expected Discharge Plan:  Home w Hospice Care  Discharge planning Services  CM Consult [facility] offered [facility]:  [facility]  Status of Service:  In process, will continue to follow  Reola MosherChandler, Aristide Waggle L, RN,MHA,BSN [phone] 09/03/2016, 2:57 PM

## 2016-09-03 NOTE — Progress Notes (Signed)
K-pad ordered for pt, however, per portable equipment, they do not have any.  Therefore, I gave pt hot packs x2 for neck pain. 

## 2016-09-03 NOTE — Progress Notes (Signed)
Internal Medicine Attending  Date: 09/03/2016  Patient name: Casimer LeekJohn W Ordoyne Medical record number: [id number] Date of birth: 1945-12-07 Age: 70 y.o. Gender: male  I saw and evaluated the patient. I reviewed the resident's note by Dr. Samuella CotaSvalina and I agree with the resident's findings and plans as documented in her progress note.  [name] has opted for home hospice. We will try to manage his air hunger with Tylenol 3 initially. His cachexia is related to his severe COPD as he is probably using 15-20% of his caloric intake in order to breathe. Although supplements such as ensure our theoretically a good thing we need to be careful that we do not provide excessive sugar which results in increased CO2 production and the need for increased ventilation, which his lungs are unlikely to be able to achieve. We may want to consider switching to a pulmonary formulation for caloric supplementation.

## 2016-09-03 NOTE — Consult Note (Signed)
Consultation Note Date: 09/03/2016   Patient Name: ANSON PEDDIE  DOB: [birth year]  MRN: [id number]  Age / Sex: 70 y.o., male   PCP: Gust Rung, DO Referring Physician: Gust Rung, DO  Reason for Consultation: Establishing goals of care and Non pain symptom management in addition to pain management  Palliative Care Assessment and Plan Summary of Established Goals of Care and Medical Treatment Preferences    Contacts/Participants in Discussion: Primary Decision Maker: Tresea Mall, Patient   HCPOA: yes  DaughterDrinda Butts [phone]  Code Status/Advance Care Planning:  DNR  Drinda Butts- Daughter, HCPOA  Does not want to be in a nursing home  Does not want tube feeds  Prefers to be at home as much as possible  Open to Home Hospice  Symptom Management:   Shortness of Breath: albuterol, Pulmicort, prednisone, duonebs, morphine oral solution 5 mg Q3H prn   Pain: Flexeril 5 mg TID prn , Morphine 5 mg Q3H prn   Complicated Migraine Headache: Ibuprofen 800 mg TID; outpatient nerve block per Neurology; Neurology recommended gabapentin as well, heating pad  Additional Recommendations (Limitations, Scope, Preferences):  Focus on controlling chronic pain from chronic headaches  Focus on improving shortness of breath  Home Hospice  Advanced Directives-- will attempt to fill out tomorrow with patient- no tube feeds, DNR  Psycho-social/Spiritual:   Support System: Sister, [name] and Daughter. Also- ex-wife and girlfriend  Desire for further Chaplaincy support: yes  Prognosis: < 6 months; meets criteria for home hospice for COPD given dyspnea at rest, poor response, decreased functional capacity, severe COPD, progression of disease with increased office visits and hospitalizations, hypoxemia at rest, and progressive weight loss.  Discharge Planning:  Home with Hospice  Domains of Care: - Physical: Managing symptoms of shortness of breath, weakness, headache - Social:  Sister, Daughter, Significant Other - Spiritual: Requests Chaplain for prayer  Values: Spiritual, appreciates being at home with his dogs Life limiting illness: Severe COPD      Chief Complaint/History of Present Illness:  Mr. Ricketson is a 70 yo male with PMHx of COPD on home oxygen, HTN, HLD, Atrial Fibrillation and complete heart block s/p biventricular pacemaker and AVN ablation, PVD s/p carotid-subclavian bypass, chronic pain, and major depressive disorder who presented to the hospital on 11/6 with worsening shortness of breath. Patient was diagnosed with a COPD exacerbation and started on treatment with Levaquin, Duonebs, Pulmicort and Prednisone. He originally required BiPAP for O2 sats in the 80s, but was able to wean to oxygen via nasal cannula.   Palliative care was consulted for goals of care discussion and consideration of home palliative care involvement. Patient lives at home by himself but is frequently visited by his sister, [name], and his daughter who live close by. He is divorced, but has a long term girlfriend who has been in a SNF for several months. Patient is a DNR. He has had increasing difficulty at home and progressive decline over the past few years. He is limited in his activity and becomes very dyspneic with minimal activity. His portable oxygen tank is too heavy for him to maneuver at home. He has lost about 15 pounds in the last 2 years from 115 pounds in 2015 to 100 lbs here on admission. This has been thoroughly worked up as outpatient and is attributed to decreased appetite, poor po intake and progressive COPD.   Patient states that his worst symptom is his chronic headache. He feels that if this were better controlled, he would  be okay. He describes his headache as intermittent, throbbing, radiating from his shoulders and neck to the front of his temples. He is sensitive to light and sound. Pain medicine helps some but not completely. He previously took [employer] in  the past but these no longer seem to work for him. At home, he will lay down in a dark room to help improve the pain. On a good day at home, he enjoys taking his dogs, Prissy and [place]- a [place] and a maltese, outside. He previously used to fish and fix things around the house but hasn't been able to do this in some time. He tries to visit his longtime girlfriend in the nursing home as often as possible. He states he is able to perform his ADLs with some difficulty but is limited with cleaning. He admits to poor appetite.   He is clear that he prefers to be at home. He would never want to be in a nursing home and would never want tube feedings. He is a DNR and states his daughter Drinda Butts is his HCPOA. I am unsure if official Advanced Directives have been filled out before. We discussed home hospice and he is very open to hospice if it can help keep him at home and help him with his symptoms. He doesn't feel anxious or worried about anything.   Primary Diagnoses  Present on Admission: . COPD exacerbation (HCC) . Protein-calorie malnutrition, severe (HCC) . Loss of weight . Essential hypertension . Intractable headache  Palliative Review of Systems: A complete ROS was negative except as per HPI.   I have reviewed the medical record, interviewed the patient and family, and examined the patient. The following aspects are pertinent.  Past Medical History:  Diagnosis Date  . Alcoholism (HCC)   . Cervical spondylosis   . Chronic diastolic CHF (congestive heart failure) (HCC)   . Complete heart block (HCC)    a. h/o permanent atrial fibrillation s/p St. Jude BiV pacemaker implanted by Dr. Bard Herbert [facility] in 12/2014 with concomitant AVN ablation.  [name] COPD (chronic obstructive pulmonary disease) (HCC)   . Coronary artery calcification seen on CT scan 02/2015   a. Lexiscan nuclear stress testing 08/01/15 which was low risk, no reversible ischemia, EF 56%..  . Depression   . Essential  hypertension   . GERD (gastroesophageal reflux disease)    Pt reports having EGD/colonoscopy in 2010 by Dr. [name] in [place] wich showed ulcers on uppper and normal colon. No report in emr.   . Hyperlipidemia    Pt typically has low HDL and low LDL.   [name] Low back pain   . Permanent atrial fibrillation (HCC)   . PVD (peripheral vascular disease) (HCC)    a. followed by vascular surgery (prior carotid subclavian bypass for subclavian steal ~03, bilateral aortoiliac and superficial femoral occlusive disease with calf claudication being managed medically.  . Right knee pain 8/10   Began to complain of after requesting a scooter.  Exam was negative and it was decided that pt should continue to ambulate.   . Subclavian steal syndrome    HX of.   . Tobacco abuse    Social History   Social History  . Marital status: Divorced    Spouse name: [name]  . Number of children: [name]  . Years of education: [name]   Social History Main Topics  . Smoking status: Former Smoker    Packs/day: 0.50    Years: 50.00    Types: Cigarettes  Start date: 07/15/2013    Quit date: 03/27/2016  . Smokeless tobacco: Former NeurosurgeonUser    Quit date: 02/01/2012     Comment: Using E-cigs. pt smoking about 3 cigarettes a month 04-12-15  . Alcohol use No  . Drug use: No  . Sexual activity: No   Other Topics Concern  . None   Social History Narrative   Disabled several years, [place].   Lives with disabled girlfriend in a trailer [place].  Works around [employer] house and keeps busy.    Family History  Problem Relation Age of Onset  . Heart failure Mother   . Heart attack Mother   . Hypertension Sister   . Heart attack Sister   . Stroke Neg Hx    Scheduled Meds: . apixaban  5 mg Oral BID  . aspirin EC  81 mg Oral Daily  . budesonide  0.5 mg Nebulization BID  . chlorhexidine  15 mL Mouth Rinse BID  . feeding supplement (ENSURE ENLIVE)  237 mL Oral TID PC & HS  . gabapentin  300 mg Oral TID  . ibuprofen  800  mg Oral TID  . ipratropium-albuterol  3 mL Inhalation Q6H  . levofloxacin  750 mg Oral Q24H  . mouth rinse  15 mL Mouth Rinse q12n4p  . metoprolol succinate  100 mg Oral Daily  . pantoprazole  20 mg Oral Daily  . predniSONE  40 mg Oral Q breakfast  . sertraline  100 mg Oral Daily  . simvastatin  40 mg Oral q1800  . sodium chloride flush  3 mL Intravenous Q12H   Continuous Infusions: PRN Meds:.acetaminophen **OR** acetaminophen, albuterol, ALPRAZolam, cyclobenzaprine, morphine CONCENTRATE, nitroGLYCERIN, senna-docusate Medications Prior to Admission:  Prior to Admission medications   Medication Sig Start Date End Date Taking? Authorizing Provider  albuterol (PROAIR HFA) 108 (90 BASE) MCG/ACT inhaler Inhale 2 puffs into the lungs every 6 (six) hours as needed for wheezing or shortness of breath.    Yes Historical Provider, MD  ALPRAZolam Prudy Feeler(XANAX) 0.25 MG tablet Take 0.25 mg by mouth 3 times daily 07/25/15  Yes Historical Provider, MD  aspirin (ASPIRIN EC) 81 MG EC tablet Take 81 mg by mouth daily. Swallow whole.   Yes Historical Provider, MD  Aspirin-Acetaminophen-Caffeine (GOODY HEADACHE PO) Take 1 packet by mouth daily as needed (headache).   Yes Historical Provider, MD  budesonide (PULMICORT) 0.5 MG/2ML nebulizer solution INHALE 1 VIAL VIA NEBULIZER TWICE DAILY 05/10/15  Yes Historical Provider, MD  cyclobenzaprine (FLEXERIL) 5 MG tablet Take 1 tablet (5 mg total) by mouth 3 (three) times daily as needed for muscle spasms. 07/07/16  Yes Arnetha CourserSumayya Amin, MD  ELIQUIS 5 MG TABS tablet TAKE 1 TABLET(5 MG) BY MOUTH TWICE DAILY 05/13/16  Yes Gust RungErik C Hoffman, DO  HYDROcodone-acetaminophen (NORCO) 10-325 MG tablet Take 1 tablet by mouth every 8 (eight) hours as needed for severe pain. 07/17/16  Yes Gust RungErik C Hoffman, DO  ipratropium-albuterol (DUONEB) 0.5-2.5 (3) MG/3ML SOLN Inhale 3 mLs into the lungs 4 (four) times daily.  01/16/15  Yes Historical Provider, MD  levalbuterol (XOPENEX HFA) 45 MCG/ACT inhaler  INHALE 2 PUFFS INTO THE LUNGS EVERY 4 HOURS AS NEEDED FOR WHEEZING 07/23/16  Yes Gust RungErik C Hoffman, DO  levalbuterol (XOPENEX) 0.63 MG/3ML nebulizer solution Take 3 mLs (0.63 mg total) by nebulization every 2 (two) hours as needed for wheezing or shortness of breath. 07/22/16  Yes Jessica Ratliff Hoffman, DO  metoprolol succinate (TOPROL XL) 100 MG 24 hr  tablet Take 1 tablet (100 mg total) by mouth daily. Take with or immediately following a meal. 03/08/16 03/07/17 Yes Gust RungErik C Hoffman, DO  pantoprazole (PROTONIX) 20 MG tablet Take 1 tablet (20 mg total) by mouth daily. 07/29/16 07/29/17 Yes Nyra MarketGorica Svalina, MD  potassium chloride SA (K-DUR,KLOR-CON) 20 MEQ tablet Take 1 tablet by mouth as directed. Take on the days you take Lasix 07/05/15  Yes Historical Provider, MD  sertraline (ZOLOFT) 50 MG tablet Take 2 tablets (100 mg total) by mouth daily. 07/29/16  Yes Nyra MarketGorica Svalina, MD  simvastatin (ZOCOR) 40 MG tablet TAKE 1 TABLET(40 MG) BY MOUTH AT BEDTIME 03/31/16  Yes Nischal Narendra, MD  SYMBICORT 160-4.5 MCG/ACT inhaler INHALE 2 PUFFS INTO THE LUNGS TWICE DAILY 06/07/15  Yes Historical Provider, MD   Allergies  Allergen Reactions  . Aspirin Other (See Comments)    REACTION: He denies anything more than mild gastric upset when he takes uncoated ASA Pt tolerates enteric coated aspirin   . Lasix [Furosemide] Other (See Comments)    ?Rash - although rash did not look like typical drug rash in 06/2015.   CBC:    Component Value Date/Time   WBC 7.8 09/03/2016 0353   HGB 12.6 (L) 09/03/2016 0353   HCT 38.7 (L) 09/03/2016 0353   HCT 38.8 07/07/2016 1516   PLT 174 09/03/2016 0353   PLT 239 07/07/2016 1516   MCV 93.5 09/03/2016 0353   MCV 96 07/07/2016 1516   NEUTROABS 5.2 07/07/2016 1516   LYMPHSABS 0.7 07/07/2016 1516   MONOABS 0.8 07/17/2015 1543   EOSABS 0.1 07/07/2016 1516   BASOSABS 0.1 07/07/2016 1516   Comprehensive Metabolic Panel:    Component Value Date/Time   NA 141 09/03/2016 0353   NA 141  07/07/2016 1516   K 4.2 09/03/2016 0353   CL 101 09/03/2016 0353   CO2 29 09/03/2016 0353   BUN 29 (H) 09/03/2016 0353   BUN 14 07/07/2016 1516   CREATININE 0.60 (L) 09/03/2016 0353   CREATININE 0.67 (L) 11/29/2015 1116   GLUCOSE 109 (H) 09/03/2016 0353   CALCIUM 9.3 09/03/2016 0353   AST 38 09/03/2016 0353   ALT 27 09/03/2016 0353   ALKPHOS 51 09/03/2016 0353   BILITOT 0.4 09/03/2016 0353   PROT 5.4 (L) 09/03/2016 0353   ALBUMIN 3.6 09/03/2016 0353    Physical Exam: Vitals:   09/02/16 2003 09/02/16 2010 09/03/16 0633 09/03/16 0807  BP: 128/85  (!) 116/91   Pulse: 74  74   Resp: (!) 22  20   Temp: 98 F (36.7 C)  98.4 F (36.9 C)   TempSrc: Oral  Oral   SpO2: 98% 98% 100% 99%  Weight:   100 lb 8 oz (45.6 kg)   Height:       General: Vital signs reviewed.  Patient is chronically ill appearing, thin, appears older than stated age, in no acute distress and cooperative with exam.  Neck: Tight musculature Cardiovascular: RRR Pulmonary/Chest: Tachypneic, expiratory wheezes, accessory muscle use, decreased air movement. Abdominal: Soft, non-tender, non-distended, BS + Extremities: No lower extremity edema bilaterally Neurological: Awake, alert, oriented. Answers all questions appropriately and moves all extremities. Skin: Warm, dry and intact.  Psychiatric: Normal mood and affect. speech and behavior is normal. Cognition and memory are normal.   Intake/output summary:   Intake/Output Summary (Last 24 hours) at 09/03/16 1110 Last data filed at 09/03/16 1017  Gross per 24 hour  Intake  1009 ml  Output             1226 ml  Net             -217 ml   LBM: Last BM Date: 09/02/16 Baseline Weight: Weight: 104 lb 12.8 oz (47.5 kg) (Scale B) Most recent weight: Weight: 100 lb 8 oz (45.6 kg) (scale b)                        Palliative Performance Scale: 50%  Additional Data Reviewed: Recent Labs     09/02/16  0410  09/03/16  0353  WBC  7.8  7.8  HGB  13.0   12.6*  PLT  182  174  NA  140  141  BUN  26*  29*  CREATININE  0.55*  0.60*     Time In: 9:15 am Time Out: 10:02 am Time Total: 47 minutes  Greater than 50%  of this time was spent counseling and coordinating care related to the above assessment and plan. Time was spent discussing symptoms, goal of care and advanced care planning.   Signed by: Karlene Lineman, DO PGY-3 Internal Medicine Resident Pager # [phone] 09/03/2016 11:10 AM  Please contact Palliative Medicine Team phone at [phone] for questions and concerns.

## 2016-09-03 NOTE — Progress Notes (Signed)
   Subjective:  Patient states he still has headaches and that is the most concerning thing for him at this time. He states his breathing is pretty unchanged; has been able to drink more of Ensure than yesterday.   Palliative spoke to patient - appreciate their help - pt prefers home with hospice  Objective:  Vital signs in last 24 hours: Vitals:   09/02/16 2003 09/02/16 2010 09/03/16 0633 09/03/16 0807  BP: 128/85  (!) 116/91   Pulse: 74  74   Resp: (!) 22  20   Temp: 98 F (36.7 C)  98.4 F (36.9 C)   TempSrc: Oral  Oral   SpO2: 98% 98% 100% 99%  Weight:   100 lb 8 oz (45.6 kg)   Height:       Constitutional: Mild air hunger with position change, able to speak several words at a time CV: RRR, no murmurs, rubs or gallops appreciated, no LE edema, pulses intact, warm extremities Resp: diffuse expiratory wheezing, pursed lips with expirations, no crackles appreciated Abd: soft, +BS, NDNT Neuro: CN 2-12 grossly intact, strength intact  Assessment/Plan:  Principal Problem:   COPD exacerbation (HCC) Active Problems:   Essential hypertension   Loss of weight   Intractable headache   Protein-calorie malnutrition, severe (HCC)   Abnormal CT of the head  COPD exacerbation:  Patient with end stage COPD with exacerbation and likely some progression of his disease.  --Levoquin 750mg  daily to cover P. Aeruginosa --Prednisone 40mg  daily --pulmicort BID, duoneb q4hr --O2 as needed to keep O2 sat 91-94% - not to be above 95% --PO codeine (T3) 30mg  q6hr PRN for air hunger --appreciate palliative help in setting up home with hospice and advanced directives  Goals of care:  Discussed with patient throughout admission. Maintains DNR status; would like to pursue home with hospice with help of palliative care. He has thought about his progressive disease and noticed worsening in last year. Has specific wishes for aggressive measures that palliative will kindly work with patient to fill  out advanced directives.   Chest pain:  Resolved.   Possible CVA: Still no neurological symptoms. D/w neuro who examined patient - think likely artifact. They will repeat CT today and f/u. Low suspicion for CVA at this time.  --continue simvastatin --ASA 81mg  daily --neuro - appreciate their recommendations  Headaches:  Patient with headaches for past 6 months, tension type. Etiology could be hypoxia or hypercarbia 2/2 severe COPD vs tension from chronic neck pain. CT unremarkable for cause. Neuro thinks possible occipital neuralgia and recommend gabapentin. --gabapentin 300mg  TID --continue to optimize COPD management --appreciate neuro reccs  A fib s/p ablation and biventricular pacemaker:  Patient in regular rhythm. On metoprolol succinate 100mg  daily, Eliquis 5mg  daily --continue metoprolol 100mg  daily  --Eliquis 5mg  daily  Chronic pain:  --continue home flexeril TID PRN --Acetaminophen PRN --T3 codeine  Cachexia:  Patient with unintentional weight loss; 5lb loss since last admission. Likely combination of depression and progressive COPD. Workup has been negative for source of weight loss. --continue ensure   Depression: --continue Zoloft 100mg  daily  Dispo: Anticipated discharge in approximately 1-2 day(s).   Leonard MarketGorica Kiet Geer, MD 09/03/2016, 11:29 AM Pager [phone]

## 2016-09-03 NOTE — Telephone Encounter (Signed)
Agree with VO, I am comfortable with remaining the attending for him.

## 2016-09-03 NOTE — Telephone Encounter (Signed)
[facility] [place] RN calls to get dr hoffman's ok for hospice to provide care to pt, VO approval given, she does states that in [place] the pcp my remain the attending or with family permission turn total care over to hospice and hospice physician, Patrica Duelkathy isom is the RN [phone]. Just let me know what you would prefer, she is meeting with the family tomorrow

## 2016-09-04 ENCOUNTER — Other Ambulatory Visit: Payer: Self-pay | Admitting: Internal Medicine

## 2016-09-04 DIAGNOSIS — R634 Abnormal weight loss: Secondary | ICD-10-CM

## 2016-09-04 MED ORDER — ENSURE ENLIVE PO LIQD
237.0000 mL | Freq: Three times a day (TID) | ORAL | 12 refills | Status: AC
Start: 2016-09-04 — End: ?

## 2016-09-04 MED ORDER — ACETAMINOPHEN-CODEINE #3 300-30 MG PO TABS: 1.0000 | ORAL_TABLET | Freq: Four times a day (QID) | ORAL | 0 refills | Status: AC | PRN

## 2016-09-04 MED ORDER — IBUPROFEN 800 MG PO TABS: 800.0000 mg | ORAL_TABLET | Freq: Three times a day (TID) | ORAL | 0 refills | Status: AC

## 2016-09-04 MED ORDER — LEVOFLOXACIN 750 MG PO TABS: 750.0000 mg | ORAL_TABLET | ORAL | 0 refills | Status: AC

## 2016-09-04 MED ORDER — MORPHINE SULFATE (CONCENTRATE) 10 MG/0.5ML PO SOLN: 5.0000 mg | ORAL | 0 refills | Status: AC | PRN

## 2016-09-04 MED ORDER — GABAPENTIN 300 MG PO CAPS: 300.0000 mg | ORAL_CAPSULE | Freq: Three times a day (TID) | ORAL | 0 refills | Status: AC

## 2016-09-04 MED ORDER — PREDNISONE 20 MG PO TABS: 40.0000 mg | ORAL_TABLET | Freq: Every day | ORAL | 0 refills | Status: AC

## 2016-09-04 NOTE — Progress Notes (Signed)
Morphone concentrate 5mg  oral given to pt r/t SOB prior to transfer to [facility].

## 2016-09-04 NOTE — Clinical Social Work Note (Signed)
Clinical Social Worker facilitated patient discharge including contacting patient family and facility to confirm patient discharge plans.  Clinical information faxed to facility and family agreeable with plan.  CSW arranged ambulance [facility]  RN to call report prior to discharge.  Clinical Social Worker will sign off for now as social work intervention is no longer needed. Please consult us again if new need arises.  [address]Bridget Mayton, ConnecticutLCSWA [phone]

## 2016-09-04 NOTE — Progress Notes (Signed)
Discharge orders received.  IV will be removed.  Waiting for MD to come to unit to sign the DNR form, then CSW will notify [facility] for transportation [facility].

## 2016-09-04 NOTE — Progress Notes (Signed)
DNR form has now been signed via MD.  [facility] will be called to transport pt.

## 2016-09-04 NOTE — Clinical Social Work Note (Signed)
Clinical Social Work Assessment  Patient Details  Name: Leonard Massey MRN: [id number] Date of Birth: Jul 07, 1946  Date of referral:  09/04/16               Reason for consult:  Facility Placement, End of Life/Hospice, Discharge Planning                Permission sought to share information with:  Facility Sport and exercise psychologist, Family Supports Permission granted to share information::  Yes, Verbal Permission Granted  Name::     Conley Simmonds  Agency::  [facility]  Relationship::  Sister  Contact Information:  [phone]  Housing/Transportation Living arrangements for the past 2 months:  Single Family Home Source of Information:  Patient, Medical Team, Adult Children, Other (Comment Required) (Sister) Patient Interpreter Needed:  None Criminal Activity/Legal Involvement Pertinent to Current Situation/Hospitalization:  No - Comment as needed Significant Relationships:  Adult Children, Siblings Lives with:    Do you feel safe going back to the place where you live?  Yes Need for family participation in patient care:  Yes (Comment)  Care giving concerns:  Patient going to [place] for one week before transitioning to home hospice. Prognosis <6 months.   Social Worker assessment / plan:  CSW met with patient. Son and sister at bedside. Patient gave permission to speak in front of them. CSW introduced role and explained that discharge planning would be discussed. Patient agreeable to inpatient hospice in [facility] CSW called and spoke with admissions coordinator at [place] to confirm bed availability for today. CSW will fax over discharge summary once available. CSW discussed with MD and asked her to sign DNR on chart. No further concerns. CSW updated patient and his sister that we are just waiting on discharge summary. No further concerns. CSW encouraged patient and his sister to contact CSW as needed. CSW will continue to follow patient and his  family for support and facilitate discharge to inpatient hospice of [place] today.  Employment status:  Retired Forensic scientist:  Medicare PT Recommendations:  Not assessed at this time Information / Referral to community resources:     Patient/Family's Response to care:  Patient and family agreeable to temporary inpatient hospice. Patient's family supportive and involved in patient's care. Patient and his family appreciated social work intervention.  Patient/Family's Understanding of and Emotional Response to Diagnosis, Current Treatment, and Prognosis:  Patient and his family understand that this discharge is for temporary inpatient hospice for symptom management. Patient and his family appear happy with hospital care.  Emotional Assessment Appearance:  Appears stated age Attitude/Demeanor/Rapport:  Other, Lethargic (Pleasant) Affect (typically observed):  Accepting, Appropriate, Calm, Pleasant Orientation:  Oriented to Self, Oriented to Place, Oriented to  Time, Oriented to Situation Alcohol / Substance use:  Tobacco Use, Alcohol Use Psych involvement (Current and /or in the community):  No (Comment)  Discharge Needs  Concerns to be addressed:  Care Coordination Readmission within the last 30 days:  No Current discharge risk:  Terminally ill Barriers to Discharge:  No Barriers Identified   Candie Chroman, LCSW 09/04/2016, 2:24 PM

## 2016-09-04 NOTE — Progress Notes (Signed)
   Subjective:  Patient states he feels overall better today. He still gets headaches but they are improved with the addition of gabapentin.   Palliative and hospice met with patient and family - agreeable to go to inpatient hospice for about 1 week for optimal symptom control before going to home hospice. Patient again adamant about eventually going home with hospice.  Objective:  Vital signs in last 24 hours: Vitals:   09/04/16 0608 09/04/16 0738 09/04/16 1011 09/04/16 1205  BP: 96/76  (!) 171/83   Pulse: 70  77   Resp: 19     Temp: 97.3 F (36.3 C)     TempSrc: Oral     SpO2: 98% 97%  97%  Weight: 100 lb 4.8 oz (45.5 kg)     Height:       Constitutional: NAD, able to speak several words at a time, VS reviewed CV: RRR, no murmurs, rubs or gallops appreciated, no LE edema, pulses intact, warm extremities Resp: diffuse expiratory wheezing, pursed lips with expirations, no crackles appreciated Abd: soft, +BS, NDNT Neuro: CN 2-12 grossly intact, strength intact  Assessment/Plan:  Principal Problem:   COPD exacerbation (HCC) Active Problems:   Essential hypertension   Loss of weight   Intractable headache   Protein-calorie malnutrition, severe (HCC)   Abnormal CT of the head   Acute on chronic respiratory failure with hypoxia (HCC)  Sever end-stage COPD with acute exacerbation:  Patient with end stage COPD with exacerbation and likely some progression of his disease.  --Levoquin '750mg'$  daily to cover P. Aeruginosa --Prednisone '40mg'$  daily --pulmicort BID, duoneb q4hr --O2 as needed to keep O2 sat 91-94% - not to be above 95% --PO codeine (T3) '30mg'$  q6hr PRN for air hunger --appreciate palliative help in setting up home with hospice and advanced directives --Patient being transferred to [place] for about a week for symptom control then to home hospice  Possible CVA - was artifact: resolved Repeat CT scan negative for acute stroke - previous scan likely  showing artifact  Headaches:  Persistent but improved with addition of gabapentin.  --gabapentin '300mg'$  TID --continue to optimize COPD management --appreciate neuro reccs  A fib s/p ablation and biventricular pacemaker:  Patient in regular rhythm. On metoprolol succinate '100mg'$  daily, Eliquis '5mg'$  daily --continue metoprolol '100mg'$  daily  --Eliquis '5mg'$  daily  Chronic pain:  --continue home flexeril TID PRN --Acetaminophen PRN --T3 codeine  Cachexia:  --continue ensure --will need [facility] or similar pulmonary formulation going forward  Depression: --continue Zoloft '100mg'$  daily  Dispo: Anticipated discharge to hospice when available.   Alphonzo Grieve, MD 09/04/2016, 1:41 PM Pager [phone]

## 2016-09-04 NOTE — Telephone Encounter (Signed)
Called kathy isom to inform her, she states pt will go to [place] hosp[facility] house for the time being, she will call if and when he is disch from there and then dr Mikey Bussinghoffman will resume attending role

## 2016-09-04 NOTE — Progress Notes (Signed)
Daily Progress Note   Patient Name: Casimer LeekJohn W Beezley       Date: 09/04/2016 DOB: 06/06/1946  Age: 70 y.o. MRN#: [id number] Attending Physician: Gust RungErik C Hoffman, DO Primary Care Physician: Gust RungErik C Hoffman, DO Admit Date: 09/01/2016  Reason for Consultation/Follow-up: Hospice Evaluation, Symptom Management, Goals of Care  Subjective: Patient was seen and examined this morning. He is more comfortable this morning and is sitting up in the chair. He reports his shortness of breath has improved. He continues to have neck pain and headache. It is temporarily relieved by pain medication, but continues to return. He is clear is his goals that he wishes to be at home, remains a DNR, does not want tube feeding in the future. His main goal is to get his headaches controlled. He is open to [facility].  Addendum: Later today, after discussion with [facility], patient was open to going to an inpatient hospice facility as a temporary measure until his symptoms are better controlled. His may reason for wanting to go home was to spend time with is dogs. [facility] has assured him that his dogs can come with him if they are vaccinated. I am okay with patient going to inpatient hospice facility as long as that is what the patient wants. He does not meet criteria for inpatient hospice (< 2 weeks), however [facility] allows patients to go temporarily to their inpatient hospice house prior to going home with hospice.   Length of Stay: 3  Current Medications: Scheduled Meds:  . apixaban  5 mg Oral BID  . aspirin EC  81 mg Oral Daily  . budesonide  0.5 mg Nebulization BID  . chlorhexidine  15 mL Mouth Rinse BID  . feeding supplement (ENSURE ENLIVE)  237 mL Oral TID PC & HS  . gabapentin  300 mg  Oral TID  . ibuprofen  800 mg Oral TID  . ipratropium-albuterol  3 mL Inhalation Q6H  . levofloxacin  750 mg Oral Q24H  . mouth rinse  15 mL Mouth Rinse q12n4p  . metoprolol succinate  100 mg Oral Daily  . pantoprazole  20 mg Oral Daily  . predniSONE  40 mg Oral Q breakfast  . sertraline  100 mg Oral Daily  . simvastatin  40 mg Oral q1800  . sodium  chloride flush  3 mL Intravenous Q12H    Continuous Infusions:   PRN Meds: acetaminophen **OR** acetaminophen, acetaminophen-codeine, albuterol, ALPRAZolam, cyclobenzaprine, morphine CONCENTRATE, nitroGLYCERIN, senna-docusate  Physical Exam         Vitals:   09/03/16 2104 09/04/16 0246 09/04/16 0608 09/04/16 0738  BP:   96/76   Pulse: 70 74 70   Resp: 18 18 19    Temp:   97.3 F (36.3 C)   TempSrc:   Oral   SpO2: 98% 94% 98% 97%  Weight:   100 lb 4.8 oz (45.5 kg)   Height:       General: Vital signs reviewed.  Patient is chronically ill appearing, cachetic, in no acute distress and cooperative with exam.  Cardiovascular: RRR. Pulmonary/Chest: Increased work of breathing, diffuse mild expiratory wheezes.  Abdominal: Soft, non-tender, non-distended, BS + Extremities: No lower extremity edema bilaterally Skin: Warm, dry and intact. Psychiatric: Normal mood and affect. speech and behavior is normal. Cognition and memory are normal.   Intake/output summary:   Intake/Output Summary (Last 24 hours) at 09/04/16 0950 Last data filed at 09/04/16 0826  Gross per 24 hour  Intake             1068 ml  Output             1100 ml  Net              -32 ml   LBM: Last BM Date: 09/02/16 Baseline Weight: Weight: 104 lb 12.8 oz (47.5 kg) (Scale B) Most recent weight: Weight: 100 lb 4.8 oz (45.5 kg) (Scale B)       Palliative Assessment/Data: 50%     Patient Active Problem List   Diagnosis Date Noted  . Acute on chronic respiratory failure with hypoxia (HCC)   . Abnormal CT of the head   . Presence of biventricular cardiac pacemaker    . Protein-calorie malnutrition, severe (HCC)   . Cachexia (HCC)   . Orthostatic hypotension   . Chronic atrial fibrillation (HCC)   . COPD exacerbation (HCC) 07/18/2016  . Intractable headache 07/07/2016  . Body mass index (BMI) of 19 or less in adult 03/27/2016  . PAD (peripheral artery disease) (HCC) 12/25/2015  . PVD (peripheral vascular disease) (HCC)   . Diverticulosis of colon 06/29/2015  . Ear drainage right 06/17/2015  . Complete heart block (HCC) 04/12/2015  . Coronary artery calcification seen on CT scan 02/25/2015  . Abnormal screening CT of chest 12/20/2014  . Long term current use of opiate analgesic 11/15/2014  . Malaise 09/07/2014  . Loss of weight 06/01/2014  . Chronic pain syndrome 01/12/2014  . Routine adult health maintenance 01/12/2014  . Permanent atrial fibrillation (HCC) 10/24/2013  . Long term current use of anticoagulant therapy- Eliquis 10/24/2013  . Decreased right shoulder range of motion 06/02/2013  . Tobacco use disorder 06/02/2013  . Carotid arterial disease (HCC) 01/18/2013  . Numbness and tingling in right hand 10/07/2012  . KNEE PAIN, RIGHT, CHRONIC 06/14/2009  . LOW BACK PAIN SYNDROME 10/15/2006  . Hyperlipidemia 09/09/2006  . ABUSE, ALCOHOL, IN REMISSION 09/09/2006  . Depression 09/09/2006  . Essential hypertension 09/09/2006  . 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 COPD, severe (HCC) 09/09/2006  . GERD 09/09/2006  . CAROTID ENDARTERECTOMY, HX OF 09/09/2006    Palliative Care Assessment & Plan   Patient Profile: [name] is a 70 yo male with PMHx of severe COPD on home oxygen, HTN, HLD, Atrial Fibrillation and complete heart block  s/p biventricular pacemaker and AVN ablation, PVD s/p carotid-subclavian bypass, chronic pain, and major depressive disorder who presented to the hospital on 11/6 with worsening shortness of breath secondary to a COPD exacerbation.  Assessment:  Severe End Stage COPD with Acute Exacerbation  Chronic  Complicated Migraines  Depression  Recommendations/Plan:  Home Hospice or [facility] per patient preference  DNR, No tube feeds  Continue Morphine concentrate 5 mg Q3H prn for shortness of breath and for pain  Continue Ibuprofen 800 mg TID, Heating Pad and Morphine for pain and migraine headaches. Consider outpatient referral to Neurology for nerve block.  Goals of Care and Additional Recommendations:  Limitations on Scope of Treatment: Full Scope Treatment, but DNR and Avoid Tube Feeds  Code Status:  DNR  Prognosis:   < 6 months  Discharge Planning:  Home with Hospice or to [facility] if that is what patient desires  Care plan was discussed with Lorinda Creed, NP with Palliative Care and Family.  Thank you for allowing the Palliative Medicine Team to assist in the care of this patient.   Time In: 9:00 am Time Out: 9:32 am Total Time  32 minutes Prolonged Time Billed  no       Greater than 50%  of this time was spent counseling and coordinating care related to the above assessment and plan. Time spent discussing symptom management, goals of care and disposition.   Karlene Lineman, DO PGY-3 Internal Medicine Resident Pager # [phone] 09/04/2016 10:01 AM  Please contact Palliative MedicineTeam phone at [phone] for questions and concerns.   Please see AMION for individual provider pager numbers.

## 2016-09-04 NOTE — Progress Notes (Addendum)
Telemetry removed.  Pt in stable condition and awaiting transport. Called [facility] at [phone] and gave report to [place], as pt will be in room #103.  Pt is in no acute distress, and Harriett Sineancy had no further questions at this time.

## 2016-09-04 NOTE — Discharge Summary (Signed)
Name: Leonard Massey MRN: [id number] DOB: [birth year] 70 y.o. PCP: Lucious Groves, DO  Date of Admission: 09/01/2016 10:11 AM Date of Discharge: 09/04/2016 Attending Physician: Lucious Groves, DO  Discharge Diagnosis: 1. COPD exacerbation Principal Problem:   COPD exacerbation ([place]) Active Problems:   Essential hypertension   Loss of weight   Intractable headache   Protein-calorie malnutrition, severe (HCC)   Abnormal CT of the head   Acute on chronic respiratory failure with hypoxia (HCC)   Discharge Medications:   Medication List    STOP taking these medications   aspirin EC 81 MG EC tablet Generic drug:  aspirin   GOODY HEADACHE PO     TAKE these medications   acetaminophen-codeine 300-30 MG tablet Commonly known as:  TYLENOL #3 Take 1 tablet by mouth every 6 (six) hours as needed for moderate pain (air hunger/dyspnea).   ALPRAZolam 0.25 MG tablet Commonly known as:  XANAX Take 0.25 mg by mouth 3 times daily   budesonide 0.5 MG/2ML nebulizer solution Commonly known as:  PULMICORT INHALE 1 VIAL VIA NEBULIZER TWICE DAILY   cyclobenzaprine 5 MG tablet Commonly known as:  FLEXERIL Take 1 tablet (5 mg total) by mouth 3 (three) times daily as needed for muscle spasms.   ELIQUIS 5 MG Tabs tablet Generic drug:  apixaban TAKE 1 TABLET(5 MG) BY MOUTH TWICE DAILY   feeding supplement (ENSURE ENLIVE) Liqd Take 237 mLs by mouth 4 (four) times daily - after meals and at bedtime.   gabapentin 300 MG capsule Commonly known as:  NEURONTIN Take 1 capsule (300 mg total) by mouth 3 (three) times daily.   HYDROcodone-acetaminophen 10-325 MG tablet Commonly known as:  NORCO Take 1 tablet by mouth every 8 (eight) hours as needed for severe pain.   ibuprofen 800 MG tablet Commonly known as:  ADVIL,MOTRIN Take 1 tablet (800 mg total) by mouth 3 (three) times daily.   ipratropium-albuterol 0.5-2.5 (3) MG/3ML Soln Commonly known as:  DUONEB Inhale 3 mLs into the lungs 4  (four) times daily.   levalbuterol 0.63 MG/3ML nebulizer solution Commonly known as:  XOPENEX Take 3 mLs (0.63 mg total) by nebulization every 2 (two) hours as needed for wheezing or shortness of breath.   levalbuterol 45 MCG/ACT inhaler Commonly known as:  XOPENEX HFA INHALE 2 PUFFS INTO THE LUNGS EVERY 4 HOURS AS NEEDED FOR WHEEZING   levofloxacin 750 MG tablet Commonly known as:  LEVAQUIN Take 1 tablet (750 mg total) by mouth daily.   metoprolol succinate 100 MG 24 hr tablet Commonly known as:  TOPROL XL Take 1 tablet (100 mg total) by mouth daily. Take with or immediately following a meal.   morphine CONCENTRATE 10 MG/0.5ML Soln concentrated solution Take 0.25 mLs (5 mg total) by mouth every 3 (three) hours as needed for moderate pain or shortness of breath.   pantoprazole 20 MG tablet Commonly known as:  PROTONIX Take 1 tablet (20 mg total) by mouth daily.   potassium chloride SA 20 MEQ tablet Commonly known as:  K-DUR,KLOR-CON Take 1 tablet by mouth as directed. Take on the days you take Lasix   predniSONE 20 MG tablet Commonly known as:  DELTASONE Take 2 tablets (40 mg total) by mouth daily with breakfast. Start taking on:  09/05/2016   PROAIR HFA 108 (90 Base) MCG/ACT inhaler Generic drug:  albuterol Inhale 2 puffs into the lungs every 6 (six) hours as needed for wheezing or shortness of breath.   sertraline 50 MG  tablet Commonly known as:  ZOLOFT Take 2 tablets (100 mg total) by mouth daily.   simvastatin 40 MG tablet Commonly known as:  ZOCOR TAKE 1 TABLET(40 MG) BY MOUTH AT BEDTIME   SYMBICORT 160-4.5 MCG/ACT inhaler Generic drug:  budesonide-formoterol INHALE 2 PUFFS INTO THE LUNGS TWICE DAILY       Disposition and follow-up:   Mr.Oaklan W Ream was discharged from [facility] in Stable condition.  At the hospital follow up visit please address:  1.   -Levofloxacin '750mg'$  daily x4 days including today -Prednisone '40mg'$  daily x4 days,  starting 09/05/16 -Please provide nutritional shakes, preferably pulmonary formulation if available  2.  Labs / imaging needed at time of follow-up: none  3.  Pending labs/ test needing follow-up: none  Follow-up Appointments: Follow-up Information    [facility] Follow up.   Specialty:  Home Health Services Why:  They will do your hospice care at your home Contact information: [address] [place] [address] [phone]           Hospital Course by problem list: Principal Problem:   COPD exacerbation ([place]) Active Problems:   Essential hypertension   Loss of weight   Intractable headache   Protein-calorie malnutrition, severe (HCC)   Abnormal CT of the head   Acute on chronic respiratory failure with hypoxia (HCC)   Severe end-stage COPD with acute exacerbation: Patient admitted for 2 days of worsening shortness of breath and bilateral lower chest pain that was associated with increased work of breathing despite compliance with inhalers. In the ED, patient was placed on continuous nebs but still experiencing shortness of breath and increased work of breathing so was placed on BiPAP. By the time transfer to floor, he was back on nasal cannula. Last admission in Sept, sputum culture + for pseudomonas aeruginosa but patient clinically improved without coverage so change in antibiotic was not made. He had been stable for more than a month. CXR negative for congestion or pneumonia. Patient started on Levofloxacin '750mg'$  daily for pseudomonas coverage, O2 via [place] to maintain O2 sats 91-94%, prednisone '40mg'$  daily, pulmicort and duoneb. Patient improving throughout hospital stay but still with air hunger and dyspnea with exertion that is consistent with progressive COPD, in setting of progressive decline in function and weight loss in the last year. We began codeine for management of air hunger. We discussed goals of care with patient and he stated wishes to spend time at  home vs hospital if possible. Patient and family met with palliative care and decision was made for home hospice. Hospice liaison suggested short stay in residential hospice for optimal symptom management before returning home with hospice.   Goals of Care: As discussed above, patient eventually going home with hospice for end-stage COPD after short stay at residential hospice for symptom control. Discussion of code status - patient maintained DNR - no chest compressions, shock, or intubation in setting of cardiopulmonary failure. Palliative provider helped patient fill out advanced directives in accordance with patient's wishes - wants antibiotics and medical management when deemed appropriate; does not wish intensive care, feeding tube or skilled nursing facility placement.   Chest pain:  Patient presented with chest pain that correlated with episodes of worsening dyspnea. Patient had EKG which was unchanged from previous and negative troponins. No further chest pain during hospitalization. Etiology likely musculoskeletal from increased work of breathing.   Headaches:  Patient with unchanged headaches x6 months that are tension type. Patient with  multiple possible etiologies including hypoxia, hypercarbia, med overuse, muscle tension, and occipital neuralgia. He had a CT head in the ED which was negative for chronic etiology but did have finding of possible early infarct in right pons. Patient without neurologic symptoms or findings. Neurology was consulted and a repeat CT was obtained without evidence of stroke, so likely imaging artifact. Neurology though headaches from occipital neuralgia and suggested gabapentin '300mg'$  TID. Patient's headaches did improve with this addition but still present.   A fib s/p ablation and biventricular pacemaker:  Patient in regular rhythm throughout admission. Continued on metoprolol succinate '100mg'$  daily and Eliquis '5mg'$  daily.  Chronic pain: We continued home  flexeril '5mg'$  TID PRN and Acetaminophen PRN. No changes or events during hospitalization. We did not continue his Norco '10mg'$  TID PRN, but did add T3 '30mg'$ .   Cachexia:  Patient with continued unintentional weight loss likely due to progressive end-stage COPD. He has been taking in less food due to dyspnea with eating and drinking; this has improved with improved symptoms. He was provided with nutritional shakes.   Depression:  We continued his Zoloft '100mg'$  daily.    Discharge Vitals:   BP (!) 171/83   Pulse 77   Temp 97.3 F (36.3 C) (Oral)   Resp 19   Ht '5\' 6"'$  (1.676 m)   Wt 100 lb 4.8 oz (45.5 kg) Comment: Scale B  SpO2 97%   BMI 16.19 kg/m   Pertinent Labs, Studies, and Procedures:  CBC Latest Ref Rng & Units 09/03/2016 09/02/2016 09/01/2016  WBC 4.0 - 10.5 K/uL 7.8 7.8 12.3(H)  Hemoglobin 13.0 - 17.0 g/dL 12.6(L) 13.0 17.2(H)  Hematocrit 39.0 - 52.0 % 38.7(L) 38.9(L) 51.3  Platelets 150 - 400 K/uL 174 182 236   BMP Latest Ref Rng & Units 09/03/2016 09/02/2016 09/01/2016  Glucose 65 - 99 mg/dL 109(H) 97 172(H)  BUN 6 - 20 mg/dL 29(H) 26(H) 27(H)  Creatinine 0.61 - 1.24 mg/dL 0.60(L) 0.55(L) 0.77  BUN/Creat Ratio 10 - 24 - - -  Sodium 135 - 145 mmol/L 141 140 137  Potassium 3.5 - 5.1 mmol/L 4.2 4.2 4.5  Chloride 101 - 111 mmol/L 101 102 100(L)  CO2 22 - 32 mmol/L '29 29 26  '$ Calcium 8.9 - 10.3 mg/dL 9.3 9.3 9.6   CXR 09/01/2016: No acute cardiopulmonary abnormality. COPD. Aortic atherosclerosis.   CT head wo contrast 09/03/2016 (repeat): No intracranial abnormality. Atherosclerotic calcification of major vessels at base of brain. Previous mastoidectomy on the right. Chronic sclerotic changes of the temporal bone.   Discharge Instructions: Discharge Instructions    Diet - low sodium heart healthy    Complete by:  As directed    Please provide nutritional supplement (pulmonary, if available)   Diet - low sodium heart healthy    Complete by:  As directed    Please provide  nutritional shake (pulmonary formulation preferred if available)   Increase activity slowly    Complete by:  As directed    Increase activity slowly    Complete by:  As directed       Signed: Alphonzo Grieve, MD 09/04/2016, 2:54 PM   Pager [phone]

## 2016-09-04 NOTE — Progress Notes (Signed)
Patient was arranged to go home with hospice but needs Inpatient Hospice for symptom management. CM talked to patient alone and he is in agreement to [facility]. Sarah Soc Worker made aware and will work on facilitating the transition to [facility]. Abelino DerrickB Kip Kautzman [facility] PaducahRN,MHA,BSN [phone]

## 2016-09-04 NOTE — Telephone Encounter (Signed)
Ok thank you Helen.  

## 2016-09-04 NOTE — Discharge Instructions (Signed)
Nutritional support: please provide [employer] or similar nutritional shake that is a pulmonary formulation

## 2016-09-07 IMAGING — CT CT ANGIO CHEST
2 of 9 series · 18 of 46 positions shown · IV contrast (OMNI)
Comparison: 03/19/2015

CLINICAL DATA: CP and SOB. Onset this morning when he woke up
and did not go to bed until 0700 this morning. Patient c/o [DATE] sharp pain.

EXAM:
CT ANGIOGRAPHY CHEST WITH CONTRAST
TECHNIQUE: Multidetector CT imaging of the chest was performed using the
standard protocol during bolus administration of intravenous
contrast. Multiplanar CT image reconstructions and MIPs were
obtained to evaluate the vascular anatomy.
CONTRAST:  80mL OMNIPAQUE IOHEXOL 350 MG/ML SOLN

[Series 5: thins · axial · 0.62mm/px · z∈[-41,+270]mm · 15 of 351 slices shown]
[im 20/351  lung]
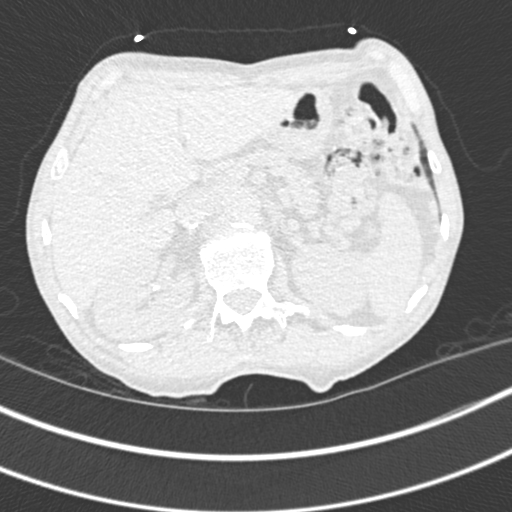
[im 39/351  soft-tissue]
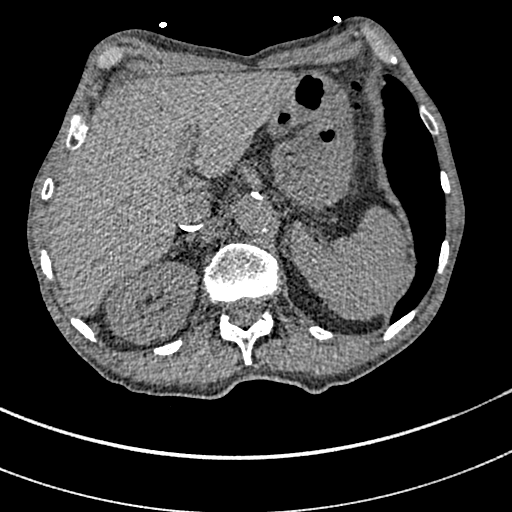
[im 59/351  lung]
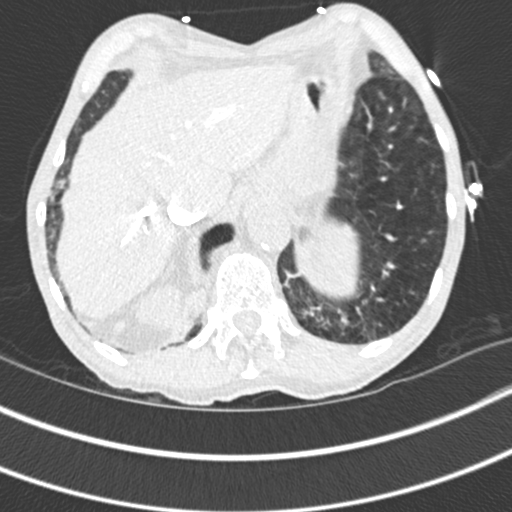
[im 78/351  soft-tissue]
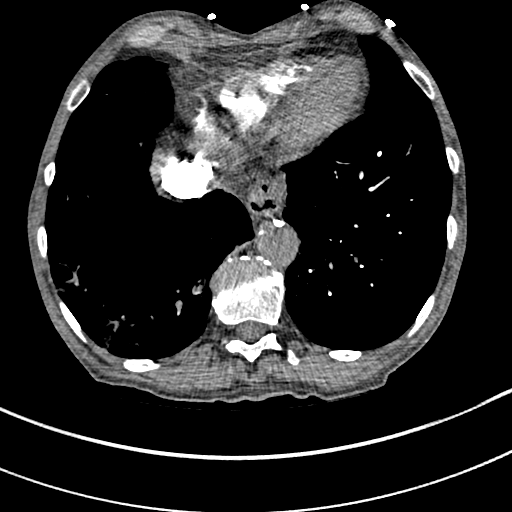
[im 117/351  lung]
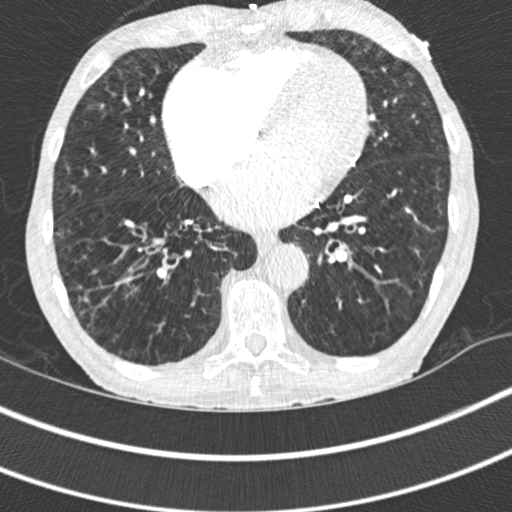
[im 137/351  soft-tissue]
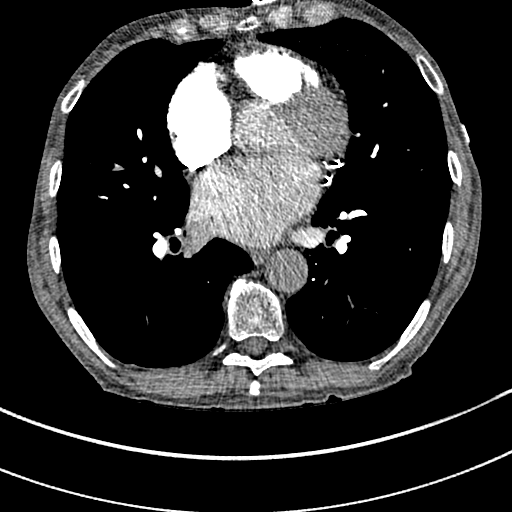
[im 156/351  lung]
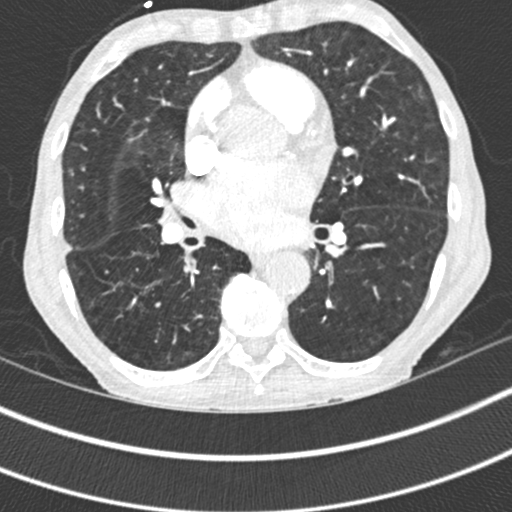
[im 176/351  soft-tissue]
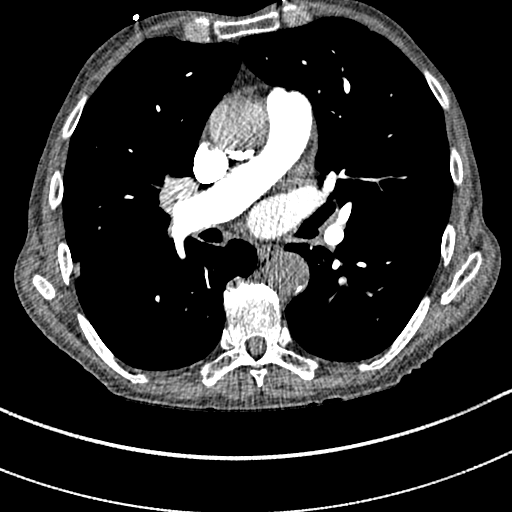
[im 195/351  lung]
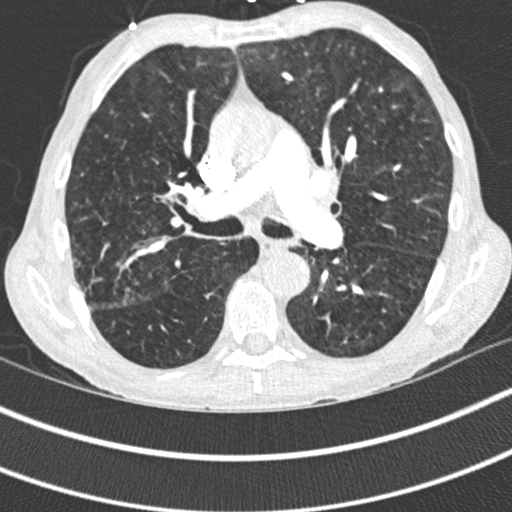
[im 214/351  soft-tissue]
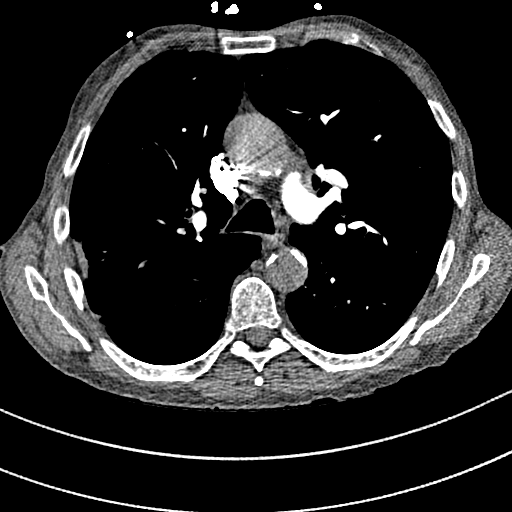
[im 234/351  lung]
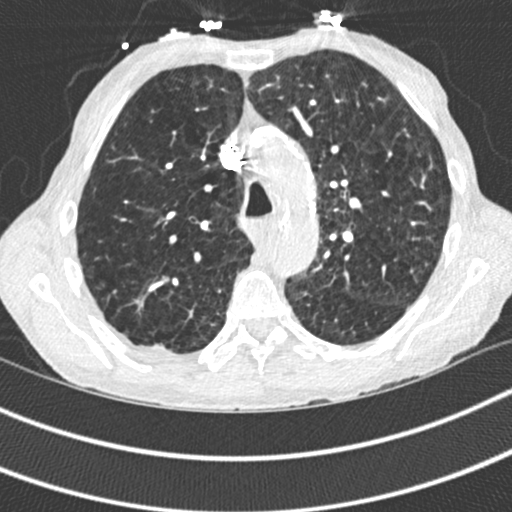
[im 273/351  soft-tissue]
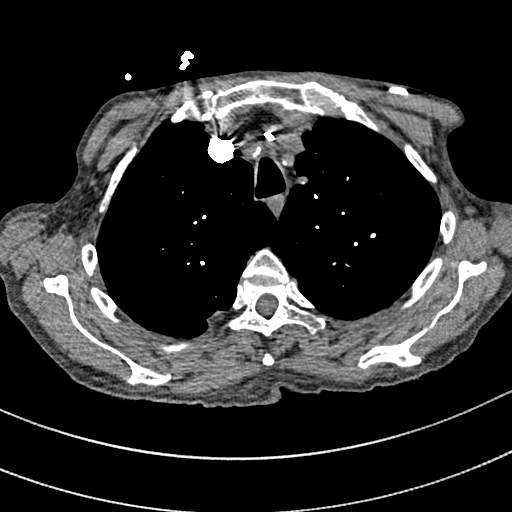
[im 292/351  lung]
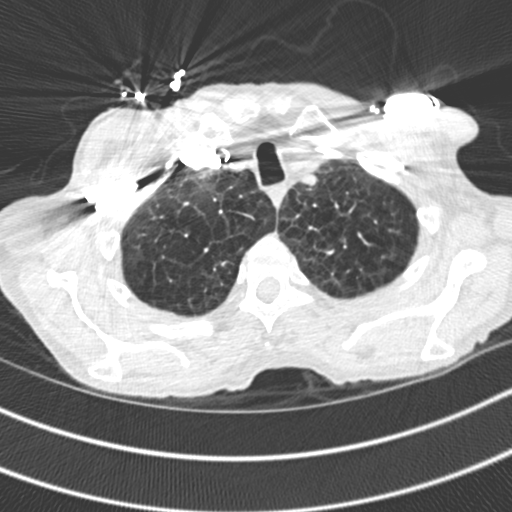
[im 312/351  soft-tissue]
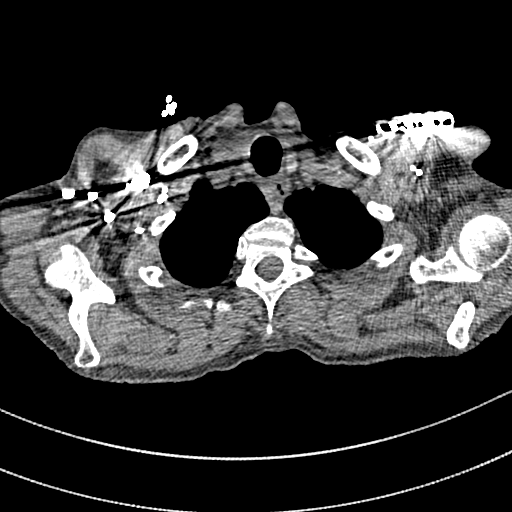
[im 331/351  lung]
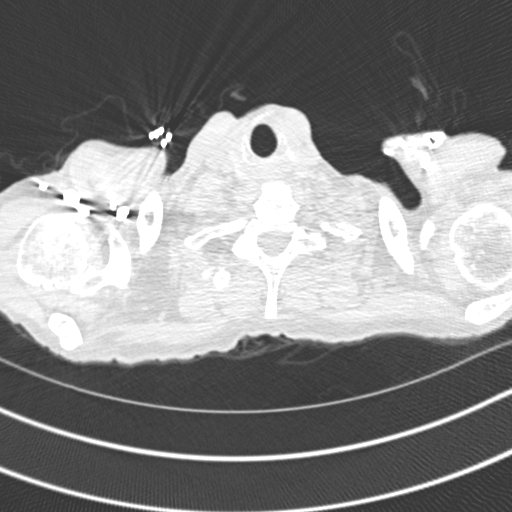

[Series 7: coronal mpr · coronal · 0.71mm/px · 3 of 134 slices shown]
[im 34/134  soft-tissue]
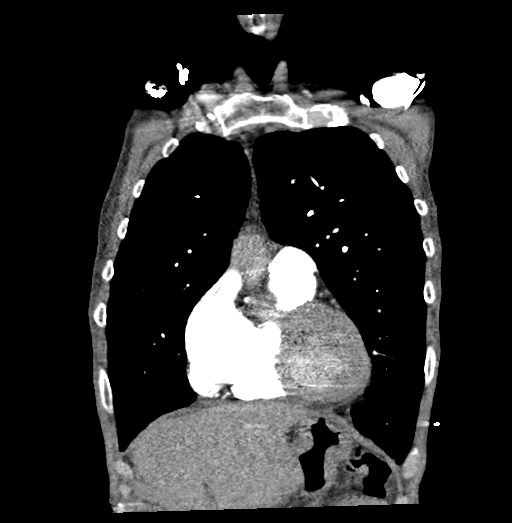
[im 67/134  soft-tissue]
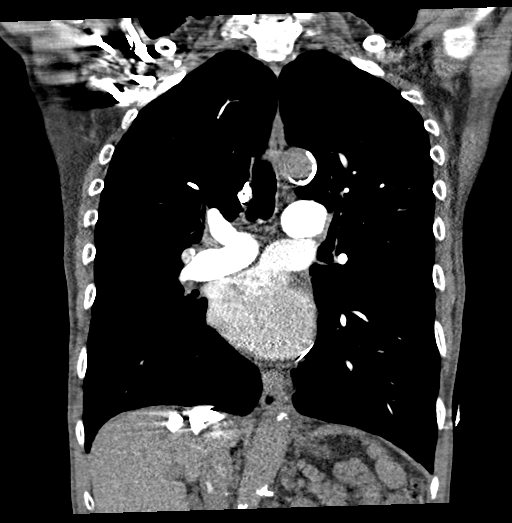
[im 100/134  soft-tissue]
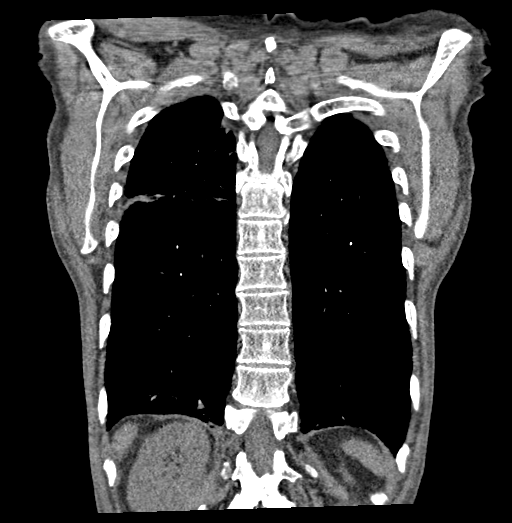

[18 of 46 positions shown; findings below may reference images not displayed]

FINDINGS: Left subclavian transvenous pacing leads. Right arm IV contrast
administration. The SVC is patent. There is mild right atrial
enlargement with significant reflux of contrast into the IVC and
hepatic veins from the right atrium. The RV is nondilated.
Satisfactory opacification of pulmonary arteries noted, and there is
no evidence of pulmonary emboli. Some streaming artifact in the
right lower lobe pulmonary artery branch. Incomplete opacification
of pulmonary veins. Left atrial enlargement. Scattered coronary
calcifications. Coarse calcifications in the thoracic aorta without
evidence of aneurysm. Incomplete aortic contrast opacification
limiting assessment for dissection.

No pleural or pericardial effusion. No hilar or mediastinal
adenopathy. Advanced emphysema. Peripheral airspace consolidation in
the posterior right upper lobe, new since previous exam. Small
branching nodular opacities peripherally in the posterior and
lateral basal segments right lower lobe, new since prior study.
Thoracic spine and sternum intact. Small hiatal hernia. Remainder
visualized upper abdomen unremarkable.

Review of the MIP images confirms the above findings.
IMPRESSION: 1. Negative for acute PE.
2. New pleural-based airspace disease in the posterior right upper
lobe, and new patchy nodular opacities in the posterior right lower
lobe. These were not present on the most recent prior CT, and are
likely infectious/inflammatory. Consider 3-6 month follow-up to
confirm appropriate resolution and exclude neoplasm however.
3. Atherosclerosis, including aortic and coronary artery disease.
Please note that although the presence of coronary artery calcium
documents the presence of coronary artery disease, the severity of
this disease and any potential stenosis cannot be assessed on this
non-gated CT examination. Assessment for potential risk factor
modification, dietary therapy or pharmacologic therapy may be
warranted, if clinically indicated.

## 2016-09-08 ENCOUNTER — Telehealth: Payer: Self-pay | Admitting: *Deleted

## 2016-09-08 NOTE — Telephone Encounter (Signed)
[facility] csw, misty, calls to say pt passed away early this am, time of death [id number]

## 2016-09-17 ENCOUNTER — Ambulatory Visit: Payer: Medicare Other | Admitting: Vascular Surgery

## 2016-09-17 ENCOUNTER — Encounter (HOSPITAL_COMMUNITY): Payer: Medicare Other

## 2016-09-23 ENCOUNTER — Telehealth: Payer: Self-pay | Admitting: Cardiology

## 2016-09-23 NOTE — Telephone Encounter (Signed)
Attempted to confirm remote transmission with pt. No answer and was unable to leave a message.   

## 2016-09-24 ENCOUNTER — Telehealth: Payer: Self-pay | Admitting: Internal Medicine

## 2016-09-24 NOTE — Telephone Encounter (Signed)
APT. REMINDER CALL, NO ANSWER, NO VOICEMAIL °

## 2016-09-25 ENCOUNTER — Ambulatory Visit: Payer: Medicare Other | Admitting: Internal Medicine

## 2016-09-26 DEATH — deceased

## 2016-09-29 ENCOUNTER — Encounter: Payer: Self-pay | Admitting: Vascular Surgery

## 2016-10-03 ENCOUNTER — Ambulatory Visit: Payer: Medicare Other | Admitting: Vascular Surgery

## 2016-10-03 ENCOUNTER — Encounter (HOSPITAL_COMMUNITY): Payer: Medicare Other

## 2018-05-13 ENCOUNTER — Encounter: Payer: Self-pay | Admitting: Cardiology

## 2019-11-28 ENCOUNTER — Encounter: Payer: Self-pay | Admitting: Gastroenterology
